# Patient Record
Sex: Male | Born: 1945 | Race: White | Hispanic: No | Marital: Married | Smoking: Former smoker
Health system: Southern US, Community
[De-identification: ages and names within clinical notes are randomized; demographics above are authoritative.]

## PROBLEM LIST (undated history)

## (undated) DIAGNOSIS — I252 Old myocardial infarction: Secondary | ICD-10-CM

## (undated) DIAGNOSIS — E119 Type 2 diabetes mellitus without complications: Secondary | ICD-10-CM

## (undated) DIAGNOSIS — I8290 Acute embolism and thrombosis of unspecified vein: Secondary | ICD-10-CM

## (undated) DIAGNOSIS — I2510 Atherosclerotic heart disease of native coronary artery without angina pectoris: Secondary | ICD-10-CM

## (undated) DIAGNOSIS — I10 Essential (primary) hypertension: Secondary | ICD-10-CM

## (undated) DIAGNOSIS — B029 Zoster without complications: Secondary | ICD-10-CM

## (undated) DIAGNOSIS — I6529 Occlusion and stenosis of unspecified carotid artery: Secondary | ICD-10-CM

## (undated) DIAGNOSIS — E1142 Type 2 diabetes mellitus with diabetic polyneuropathy: Secondary | ICD-10-CM

## (undated) DIAGNOSIS — K559 Vascular disorder of intestine, unspecified: Secondary | ICD-10-CM

## (undated) DIAGNOSIS — Z87898 Personal history of other specified conditions: Secondary | ICD-10-CM

## (undated) DIAGNOSIS — E785 Hyperlipidemia, unspecified: Secondary | ICD-10-CM

## (undated) DIAGNOSIS — I82409 Acute embolism and thrombosis of unspecified deep veins of unspecified lower extremity: Secondary | ICD-10-CM

## (undated) DIAGNOSIS — E039 Hypothyroidism, unspecified: Secondary | ICD-10-CM

## (undated) DIAGNOSIS — Z87891 Personal history of nicotine dependence: Secondary | ICD-10-CM

## (undated) DIAGNOSIS — N400 Enlarged prostate without lower urinary tract symptoms: Secondary | ICD-10-CM

## (undated) DIAGNOSIS — M109 Gout, unspecified: Secondary | ICD-10-CM

## (undated) DIAGNOSIS — I219 Acute myocardial infarction, unspecified: Secondary | ICD-10-CM

## (undated) DIAGNOSIS — I739 Peripheral vascular disease, unspecified: Secondary | ICD-10-CM

## (undated) HISTORY — PX: OTHER SURGICAL HISTORY: SHX169

## (undated) HISTORY — DX: Type 2 diabetes mellitus without complications: E11.9

## (undated) HISTORY — DX: Hypothyroidism, unspecified: E03.9

## (undated) HISTORY — PX: CATARACT EXTRACTION: SUR2

## (undated) HISTORY — DX: Acute embolism and thrombosis of unspecified deep veins of unspecified lower extremity: I82.409

## (undated) HISTORY — DX: Type 2 diabetes mellitus with diabetic polyneuropathy: E11.42

## (undated) HISTORY — DX: Personal history of other specified conditions: Z87.898

## (undated) HISTORY — DX: Peripheral vascular disease, unspecified: I73.9

## (undated) HISTORY — DX: Occlusion and stenosis of unspecified carotid artery: I65.29

## (undated) HISTORY — DX: Zoster without complications: B02.9

## (undated) HISTORY — DX: Essential (primary) hypertension: I10

## (undated) HISTORY — DX: Atherosclerotic heart disease of native coronary artery without angina pectoris: I25.10

## (undated) HISTORY — DX: Personal history of nicotine dependence: Z87.891

## (undated) HISTORY — DX: Vascular disorder of intestine, unspecified: K55.9

## (undated) HISTORY — DX: Acute myocardial infarction, unspecified: I21.9

## (undated) HISTORY — DX: Benign prostatic hyperplasia without lower urinary tract symptoms: N40.0

## (undated) HISTORY — DX: Gout, unspecified: M10.9

## (undated) HISTORY — DX: Acute embolism and thrombosis of unspecified vein: I82.90

## (undated) HISTORY — DX: Hyperlipidemia, unspecified: E78.5

## (undated) HISTORY — DX: Old myocardial infarction: I25.2

---

## 1998-12-10 ENCOUNTER — Encounter (HOSPITAL_COMMUNITY): Admission: RE | Admit: 1998-12-10 | Discharge: 1999-03-10 | Payer: Self-pay

## 2000-07-30 ENCOUNTER — Inpatient Hospital Stay (HOSPITAL_COMMUNITY): Admission: EM | Admit: 2000-07-30 | Discharge: 2000-08-08 | Payer: Self-pay | Admitting: Emergency Medicine

## 2000-07-31 ENCOUNTER — Encounter: Payer: Self-pay | Admitting: Cardiothoracic Surgery

## 2000-08-04 ENCOUNTER — Encounter: Payer: Self-pay | Admitting: Cardiothoracic Surgery

## 2000-08-04 HISTORY — PX: CORONARY ARTERY BYPASS GRAFT: SHX141

## 2000-08-04 HISTORY — PX: PR VEIN BYPASS GRAFT,AORTO-FEM-POP: 35551

## 2000-08-05 ENCOUNTER — Encounter: Payer: Self-pay | Admitting: Cardiothoracic Surgery

## 2000-08-06 ENCOUNTER — Encounter: Payer: Self-pay | Admitting: *Deleted

## 2000-08-07 ENCOUNTER — Encounter: Payer: Self-pay | Admitting: Cardiothoracic Surgery

## 2004-06-13 ENCOUNTER — Emergency Department (HOSPITAL_COMMUNITY): Admission: EM | Admit: 2004-06-13 | Discharge: 2004-06-13 | Payer: Self-pay | Admitting: Family Medicine

## 2004-07-16 ENCOUNTER — Emergency Department (HOSPITAL_COMMUNITY): Admission: EM | Admit: 2004-07-16 | Discharge: 2004-07-16 | Payer: Self-pay | Admitting: Family Medicine

## 2005-03-03 ENCOUNTER — Ambulatory Visit: Payer: Self-pay | Admitting: Internal Medicine

## 2005-03-17 ENCOUNTER — Ambulatory Visit: Payer: Self-pay

## 2005-03-17 ENCOUNTER — Ambulatory Visit: Payer: Self-pay | Admitting: Internal Medicine

## 2005-04-02 ENCOUNTER — Ambulatory Visit: Payer: Self-pay | Admitting: Internal Medicine

## 2005-05-07 ENCOUNTER — Ambulatory Visit: Payer: Self-pay | Admitting: Internal Medicine

## 2005-05-13 ENCOUNTER — Ambulatory Visit: Payer: Self-pay | Admitting: Internal Medicine

## 2005-05-14 ENCOUNTER — Ambulatory Visit: Payer: Self-pay | Admitting: Internal Medicine

## 2005-06-24 DIAGNOSIS — K559 Vascular disorder of intestine, unspecified: Secondary | ICD-10-CM

## 2005-06-24 HISTORY — DX: Vascular disorder of intestine, unspecified: K55.9

## 2005-07-21 ENCOUNTER — Ambulatory Visit: Payer: Self-pay | Admitting: Internal Medicine

## 2005-07-21 ENCOUNTER — Inpatient Hospital Stay (HOSPITAL_COMMUNITY): Admission: EM | Admit: 2005-07-21 | Discharge: 2005-07-25 | Payer: Self-pay | Admitting: Emergency Medicine

## 2005-07-22 ENCOUNTER — Ambulatory Visit: Payer: Self-pay | Admitting: Internal Medicine

## 2005-07-24 ENCOUNTER — Encounter (INDEPENDENT_AMBULATORY_CARE_PROVIDER_SITE_OTHER): Payer: Self-pay | Admitting: *Deleted

## 2005-07-24 ENCOUNTER — Ambulatory Visit: Payer: Self-pay | Admitting: Gastroenterology

## 2005-07-31 ENCOUNTER — Ambulatory Visit: Payer: Self-pay | Admitting: Internal Medicine

## 2005-08-07 ENCOUNTER — Ambulatory Visit: Payer: Self-pay | Admitting: Internal Medicine

## 2005-08-19 ENCOUNTER — Ambulatory Visit: Payer: Self-pay | Admitting: Internal Medicine

## 2005-09-05 ENCOUNTER — Ambulatory Visit: Payer: Self-pay | Admitting: Internal Medicine

## 2005-09-10 ENCOUNTER — Ambulatory Visit: Payer: Self-pay | Admitting: Internal Medicine

## 2005-09-17 ENCOUNTER — Ambulatory Visit: Payer: Self-pay | Admitting: Internal Medicine

## 2005-12-19 ENCOUNTER — Ambulatory Visit: Payer: Self-pay | Admitting: Internal Medicine

## 2005-12-23 ENCOUNTER — Ambulatory Visit: Payer: Self-pay | Admitting: Internal Medicine

## 2006-02-16 ENCOUNTER — Ambulatory Visit: Payer: Self-pay | Admitting: Internal Medicine

## 2006-02-16 LAB — CONVERTED CEMR LAB: PSA: 0.24 ng/mL

## 2006-02-19 ENCOUNTER — Ambulatory Visit: Payer: Self-pay | Admitting: Internal Medicine

## 2006-04-30 ENCOUNTER — Ambulatory Visit: Payer: Self-pay | Admitting: Internal Medicine

## 2006-05-26 ENCOUNTER — Ambulatory Visit: Payer: Self-pay | Admitting: Internal Medicine

## 2006-08-26 ENCOUNTER — Ambulatory Visit: Payer: Self-pay | Admitting: Internal Medicine

## 2006-12-02 ENCOUNTER — Ambulatory Visit: Payer: Self-pay | Admitting: Internal Medicine

## 2007-03-03 ENCOUNTER — Ambulatory Visit: Payer: Self-pay | Admitting: Internal Medicine

## 2007-03-03 LAB — CONVERTED CEMR LAB
ALT: 16 units/L (ref 0–40)
AST: 17 units/L (ref 0–37)
Albumin: 3.7 g/dL (ref 3.5–5.2)
Calcium: 9.3 mg/dL (ref 8.4–10.5)
Chloride: 110 meq/L (ref 96–112)
Cholesterol: 95 mg/dL (ref 0–200)
Creatinine, Ser: 1.4 mg/dL (ref 0.4–1.5)
Creatinine,U: 72.7 mg/dL
GFR calc non Af Amer: 55 mL/min
Glucose, Bld: 118 mg/dL — ABNORMAL HIGH (ref 70–99)
HDL: 38.6 mg/dL — ABNORMAL LOW (ref >39.0)
Hgb A1c MFr Bld: 6 % (ref 4.6–6.0)
LDL Cholesterol: 42 mg/dL (ref 0–99)
Sodium: 143 meq/L (ref 135–145)
Total Bilirubin: 0.7 mg/dL (ref 0.3–1.2)
VLDL: 14 mg/dL (ref 0–40)

## 2007-06-03 ENCOUNTER — Ambulatory Visit: Payer: Self-pay | Admitting: Internal Medicine

## 2007-06-17 ENCOUNTER — Encounter: Payer: Self-pay | Admitting: Internal Medicine

## 2007-06-17 DIAGNOSIS — K559 Vascular disorder of intestine, unspecified: Secondary | ICD-10-CM | POA: Insufficient documentation

## 2007-06-17 DIAGNOSIS — I10 Essential (primary) hypertension: Secondary | ICD-10-CM | POA: Insufficient documentation

## 2007-06-17 DIAGNOSIS — I2510 Atherosclerotic heart disease of native coronary artery without angina pectoris: Secondary | ICD-10-CM

## 2007-06-17 DIAGNOSIS — E118 Type 2 diabetes mellitus with unspecified complications: Secondary | ICD-10-CM

## 2007-06-17 DIAGNOSIS — F17200 Nicotine dependence, unspecified, uncomplicated: Secondary | ICD-10-CM | POA: Insufficient documentation

## 2007-06-17 DIAGNOSIS — I252 Old myocardial infarction: Secondary | ICD-10-CM | POA: Insufficient documentation

## 2007-06-17 DIAGNOSIS — E785 Hyperlipidemia, unspecified: Secondary | ICD-10-CM | POA: Insufficient documentation

## 2007-06-17 DIAGNOSIS — F339 Major depressive disorder, recurrent, unspecified: Secondary | ICD-10-CM

## 2007-06-17 DIAGNOSIS — N400 Enlarged prostate without lower urinary tract symptoms: Secondary | ICD-10-CM

## 2007-06-22 ENCOUNTER — Ambulatory Visit (HOSPITAL_BASED_OUTPATIENT_CLINIC_OR_DEPARTMENT_OTHER): Admission: RE | Admit: 2007-06-22 | Discharge: 2007-06-22 | Payer: Self-pay | Admitting: Internal Medicine

## 2007-07-07 ENCOUNTER — Ambulatory Visit: Payer: Self-pay | Admitting: Pulmonary Disease

## 2008-04-24 DIAGNOSIS — I8290 Acute embolism and thrombosis of unspecified vein: Secondary | ICD-10-CM

## 2008-04-24 HISTORY — DX: Acute embolism and thrombosis of unspecified vein: I82.90

## 2008-04-26 ENCOUNTER — Inpatient Hospital Stay (HOSPITAL_COMMUNITY): Admission: EM | Admit: 2008-04-26 | Discharge: 2008-04-28 | Payer: Self-pay | Admitting: Emergency Medicine

## 2008-04-26 ENCOUNTER — Encounter: Payer: Self-pay | Admitting: Vascular Surgery

## 2008-04-26 ENCOUNTER — Ambulatory Visit: Payer: Self-pay | Admitting: Cardiology

## 2008-04-26 ENCOUNTER — Encounter (INDEPENDENT_AMBULATORY_CARE_PROVIDER_SITE_OTHER): Payer: Self-pay | Admitting: Emergency Medicine

## 2008-04-26 ENCOUNTER — Ambulatory Visit: Payer: Self-pay | Admitting: Vascular Surgery

## 2008-04-27 ENCOUNTER — Encounter: Payer: Self-pay | Admitting: Vascular Surgery

## 2008-04-28 ENCOUNTER — Ambulatory Visit: Payer: Self-pay | Admitting: *Deleted

## 2008-05-16 ENCOUNTER — Ambulatory Visit: Payer: Self-pay | Admitting: Vascular Surgery

## 2008-05-25 ENCOUNTER — Ambulatory Visit: Payer: Self-pay | Admitting: Cardiology

## 2008-07-04 ENCOUNTER — Ambulatory Visit: Payer: Self-pay | Admitting: Vascular Surgery

## 2008-08-15 ENCOUNTER — Ambulatory Visit: Payer: Self-pay | Admitting: Internal Medicine

## 2008-08-15 DIAGNOSIS — I749 Embolism and thrombosis of unspecified artery: Secondary | ICD-10-CM | POA: Insufficient documentation

## 2008-08-22 ENCOUNTER — Ambulatory Visit: Payer: Self-pay | Admitting: Hematology & Oncology

## 2008-09-01 ENCOUNTER — Encounter: Payer: Self-pay | Admitting: Internal Medicine

## 2008-09-01 LAB — CBC WITH DIFFERENTIAL (CANCER CENTER ONLY)
BASO#: 0.1 10*3/uL (ref 0.0–0.2)
EOS%: 4.2 % (ref 0.0–7.0)
Eosinophils Absolute: 0.4 10*3/uL (ref 0.0–0.5)
HGB: 15.5 g/dL (ref 13.0–17.1)
LYMPH#: 2.3 10*3/uL (ref 0.9–3.3)
MONO#: 0.6 10*3/uL (ref 0.1–0.9)
NEUT#: 5.5 10*3/uL (ref 1.5–6.5)
RBC: 5.31 10*6/uL (ref 4.20–5.70)
WBC: 8.9 10*3/uL (ref 4.0–10.0)

## 2008-09-08 LAB — HYPERCOAGULABLE PANEL, COMPREHENSIVE RET.
Anticardiolipin IgA: 11 [APL'U] (ref <13)
DRVVT: 52.5 secs — ABNORMAL HIGH (ref 36.1–47.0)
Homocysteine: 13.9 umol/L (ref 4.0–15.4)
Protein C Activity: 128 % (ref 75–133)
Protein C, Total: 88 % (ref 70–140)
Protein S Activity: 131 % — ABNORMAL HIGH (ref 69–129)

## 2008-09-21 ENCOUNTER — Ambulatory Visit: Payer: Self-pay | Admitting: Internal Medicine

## 2008-09-21 LAB — CONVERTED CEMR LAB
ALT: 17 units/L (ref 0–53)
Albumin: 4.1 g/dL (ref 3.5–5.2)
BUN: 33 mg/dL — ABNORMAL HIGH (ref 6–23)
Bilirubin, Direct: 0.1 mg/dL (ref 0.0–0.3)
CO2: 24 meq/L (ref 19–32)
Calcium: 9.3 mg/dL (ref 8.4–10.5)
Cholesterol: 108 mg/dL (ref 0–200)
Creatinine, Ser: 1.7 mg/dL — ABNORMAL HIGH (ref 0.4–1.5)
Creatinine,U: 174.3 mg/dL
GFR calc Af Amer: 53 mL/min
Glucose, Bld: 111 mg/dL — ABNORMAL HIGH (ref 70–99)
HDL: 29.2 mg/dL — ABNORMAL LOW (ref >39.0)
Microalb Creat Ratio: 34.4 mg/g — ABNORMAL HIGH (ref 0.0–30.0)
Microalb, Ur: 6 mg/dL — ABNORMAL HIGH (ref 0.0–1.9)
Total Protein: 7.5 g/dL (ref 6.0–8.3)
Triglycerides: 100 mg/dL (ref 0–149)
VLDL: 20 mg/dL (ref 0–40)

## 2008-09-26 ENCOUNTER — Ambulatory Visit: Payer: Self-pay | Admitting: Internal Medicine

## 2008-10-04 ENCOUNTER — Encounter: Payer: Self-pay | Admitting: Internal Medicine

## 2009-01-01 ENCOUNTER — Ambulatory Visit: Payer: Self-pay | Admitting: Internal Medicine

## 2009-01-01 LAB — CONVERTED CEMR LAB
BUN: 22 mg/dL (ref 6–23)
CO2: 27 meq/L (ref 19–32)
Calcium: 9.8 mg/dL (ref 8.4–10.5)
Glucose, Bld: 104 mg/dL — ABNORMAL HIGH (ref 70–99)
Microalb, Ur: 8.4 mg/dL — ABNORMAL HIGH (ref 0.0–1.9)
Sodium: 139 meq/L (ref 135–145)

## 2009-04-02 ENCOUNTER — Ambulatory Visit: Payer: Self-pay | Admitting: Internal Medicine

## 2009-04-02 DIAGNOSIS — E1140 Type 2 diabetes mellitus with diabetic neuropathy, unspecified: Secondary | ICD-10-CM

## 2009-05-07 ENCOUNTER — Encounter: Payer: Self-pay | Admitting: Internal Medicine

## 2009-05-08 ENCOUNTER — Encounter: Payer: Self-pay | Admitting: Internal Medicine

## 2009-07-27 ENCOUNTER — Ambulatory Visit: Payer: Self-pay | Admitting: Internal Medicine

## 2009-07-27 LAB — CONVERTED CEMR LAB
ALT: 20 units/L (ref 0–53)
AST: 18 units/L (ref 0–37)
Albumin: 4.2 g/dL (ref 3.5–5.2)
Alkaline Phosphatase: 62 units/L (ref 39–117)
Chloride: 108 meq/L (ref 96–112)
Creatinine,U: 61.4 mg/dL
GFR calc non Af Amer: 54.22 mL/min (ref >60)
Glucose, Bld: 90 mg/dL (ref 70–99)
Hgb A1c MFr Bld: 6 % (ref 4.6–6.5)
LDL Cholesterol: 60 mg/dL (ref 0–99)
Microalb, Ur: 6.7 mg/dL — ABNORMAL HIGH (ref 0.0–1.9)
Potassium: 4.2 meq/L (ref 3.5–5.1)
Sodium: 142 meq/L (ref 135–145)
Total Bilirubin: 0.7 mg/dL (ref 0.3–1.2)
VLDL: 26.8 mg/dL (ref 0.0–40.0)

## 2009-08-03 ENCOUNTER — Ambulatory Visit: Payer: Self-pay | Admitting: Internal Medicine

## 2009-09-28 ENCOUNTER — Encounter: Payer: Self-pay | Admitting: Vascular Surgery

## 2009-09-28 ENCOUNTER — Inpatient Hospital Stay (HOSPITAL_COMMUNITY): Admission: EM | Admit: 2009-09-28 | Discharge: 2009-09-29 | Payer: Self-pay | Admitting: Emergency Medicine

## 2009-09-28 ENCOUNTER — Ambulatory Visit: Payer: Self-pay | Admitting: Vascular Surgery

## 2009-09-29 ENCOUNTER — Encounter (INDEPENDENT_AMBULATORY_CARE_PROVIDER_SITE_OTHER): Payer: Self-pay | Admitting: Family Medicine

## 2009-10-02 ENCOUNTER — Ambulatory Visit: Payer: Self-pay | Admitting: Internal Medicine

## 2009-10-02 DIAGNOSIS — R55 Syncope and collapse: Secondary | ICD-10-CM | POA: Insufficient documentation

## 2009-10-10 ENCOUNTER — Encounter: Payer: Self-pay | Admitting: Cardiology

## 2009-10-10 ENCOUNTER — Encounter (INDEPENDENT_AMBULATORY_CARE_PROVIDER_SITE_OTHER): Payer: Self-pay | Admitting: *Deleted

## 2009-10-10 ENCOUNTER — Ambulatory Visit: Payer: Self-pay | Admitting: Cardiology

## 2009-10-10 ENCOUNTER — Telehealth: Payer: Self-pay | Admitting: Internal Medicine

## 2009-10-25 ENCOUNTER — Telehealth (INDEPENDENT_AMBULATORY_CARE_PROVIDER_SITE_OTHER): Payer: Self-pay | Admitting: *Deleted

## 2009-10-29 ENCOUNTER — Ambulatory Visit (HOSPITAL_COMMUNITY): Admission: RE | Admit: 2009-10-29 | Discharge: 2009-10-29 | Payer: Self-pay | Admitting: Cardiology

## 2009-10-29 ENCOUNTER — Ambulatory Visit: Payer: Self-pay

## 2009-10-29 ENCOUNTER — Ambulatory Visit: Payer: Self-pay | Admitting: Cardiology

## 2009-10-29 ENCOUNTER — Encounter: Payer: Self-pay | Admitting: Cardiology

## 2009-10-29 ENCOUNTER — Encounter (HOSPITAL_COMMUNITY): Admission: RE | Admit: 2009-10-29 | Discharge: 2009-11-21 | Payer: Self-pay | Admitting: Cardiology

## 2009-11-28 ENCOUNTER — Telehealth (INDEPENDENT_AMBULATORY_CARE_PROVIDER_SITE_OTHER): Payer: Self-pay | Admitting: *Deleted

## 2009-12-12 ENCOUNTER — Encounter: Payer: Self-pay | Admitting: Cardiology

## 2009-12-12 ENCOUNTER — Ambulatory Visit: Payer: Self-pay | Admitting: Cardiology

## 2010-01-24 ENCOUNTER — Ambulatory Visit: Payer: Self-pay | Admitting: Internal Medicine

## 2010-01-24 LAB — CONVERTED CEMR LAB
Albumin: 4.3 g/dL (ref 3.5–5.2)
BUN: 18 mg/dL (ref 6–23)
Calcium: 9.5 mg/dL (ref 8.4–10.5)
Cholesterol: 112 mg/dL (ref 0–200)
GFR calc non Af Amer: 54.14 mL/min (ref >60)
Glucose, Bld: 124 mg/dL — ABNORMAL HIGH (ref 70–99)
HDL: 37.8 mg/dL — ABNORMAL LOW (ref >39.00)
Hgb A1c MFr Bld: 6.1 % (ref 4.6–6.5)
Microalb Creat Ratio: 228.6 mg/g — ABNORMAL HIGH (ref 0.0–30.0)
Triglycerides: 144 mg/dL (ref 0.0–149.0)
VLDL: 28.8 mg/dL (ref 0.0–40.0)

## 2010-01-31 ENCOUNTER — Ambulatory Visit: Payer: Self-pay | Admitting: Internal Medicine

## 2010-04-01 ENCOUNTER — Ambulatory Visit: Payer: Self-pay | Admitting: Internal Medicine

## 2010-04-01 LAB — CONVERTED CEMR LAB: TSH: 3.79 microintl units/mL (ref 0.35–5.50)

## 2010-04-03 ENCOUNTER — Telehealth: Payer: Self-pay | Admitting: Internal Medicine

## 2010-06-04 ENCOUNTER — Ambulatory Visit: Payer: Self-pay | Admitting: Internal Medicine

## 2010-06-04 ENCOUNTER — Telehealth: Payer: Self-pay | Admitting: Internal Medicine

## 2010-06-04 LAB — CONVERTED CEMR LAB: TSH: 2.2 microintl units/mL (ref 0.35–5.50)

## 2010-08-01 ENCOUNTER — Encounter: Payer: Self-pay | Admitting: Internal Medicine

## 2010-08-01 ENCOUNTER — Telehealth: Payer: Self-pay | Admitting: Internal Medicine

## 2010-08-02 ENCOUNTER — Ambulatory Visit: Payer: Self-pay | Admitting: Internal Medicine

## 2010-08-02 LAB — CONVERTED CEMR LAB
BUN: 38 mg/dL — ABNORMAL HIGH (ref 6–23)
Hgb A1c MFr Bld: 6.1 % — ABNORMAL HIGH (ref <5.7)
Potassium: 4.2 meq/L (ref 3.5–5.3)
Sodium: 141 meq/L (ref 135–145)

## 2010-08-05 ENCOUNTER — Telehealth: Payer: Self-pay | Admitting: Internal Medicine

## 2010-08-05 ENCOUNTER — Encounter: Payer: Self-pay | Admitting: Internal Medicine

## 2010-08-15 ENCOUNTER — Telehealth: Payer: Self-pay | Admitting: Internal Medicine

## 2010-08-21 ENCOUNTER — Telehealth: Payer: Self-pay | Admitting: Internal Medicine

## 2010-08-21 ENCOUNTER — Ambulatory Visit: Payer: Self-pay | Admitting: Internal Medicine

## 2010-08-21 LAB — CONVERTED CEMR LAB
CO2: 28 meq/L (ref 19–32)
Calcium: 9.6 mg/dL (ref 8.4–10.5)
Sodium: 140 meq/L (ref 135–145)

## 2010-09-18 ENCOUNTER — Ambulatory Visit: Payer: Self-pay | Admitting: Internal Medicine

## 2010-09-18 ENCOUNTER — Telehealth: Payer: Self-pay | Admitting: Internal Medicine

## 2010-09-18 LAB — CONVERTED CEMR LAB
BUN: 28 mg/dL — ABNORMAL HIGH (ref 6–23)
Chloride: 106 meq/L (ref 96–112)
Potassium: 4.5 meq/L (ref 3.5–5.1)

## 2010-11-04 ENCOUNTER — Ambulatory Visit: Payer: Self-pay | Admitting: Internal Medicine

## 2010-11-04 ENCOUNTER — Encounter: Payer: Self-pay | Admitting: Internal Medicine

## 2010-11-04 DIAGNOSIS — N189 Chronic kidney disease, unspecified: Secondary | ICD-10-CM

## 2010-11-04 LAB — CONVERTED CEMR LAB
CO2: 25 meq/L (ref 19–32)
Calcium: 9.7 mg/dL (ref 8.4–10.5)
Glucose, Bld: 87 mg/dL (ref 70–99)
Potassium: 4.4 meq/L (ref 3.5–5.3)
Sodium: 140 meq/L (ref 135–145)

## 2010-11-05 ENCOUNTER — Encounter: Payer: Self-pay | Admitting: Internal Medicine

## 2010-11-24 HISTORY — PX: OTHER SURGICAL HISTORY: SHX169

## 2010-11-25 IMAGING — CT CT HEAD W/O CM
1 of 9 series · 3 of 33 positions shown, 4 images · IV contrast (agent unspecified)
Comparison: None.
COMPARISON: None.

CLINICAL DATA: Pain in back of neck followed by syncopal episode.
History diabetes.

CT HEAD WITHOUT CONTRAST
TECHNIQUE: Contiguous axial images were obtained from the base of
the skull through the vertex without intravenous contrast.
TECHNIQUE: Multidetector CT imaging of the neck was performed
using the standard protocol during bolus administration of
intravenous contrast. Multiplanar CT image reconstructions
including MIPs were obtained to evaluate the vascular anatomy.
Carotid stenosis measurements (when applicable) are obtained
utilizing NASCET criteria, using the distal internal carotid
diameter as the denominator.
Contrast: 100 ml Hmnipaque-7PP (approval by Dr. Peder Gervin with
recommendation for hydration before after procedure).

[Series 8: angio neck 2mm · axial · 0.43mm/px · z∈[+1028,+1246]mm · 3 of 110 slices shown, 4 images]
[im 1/110  soft-tissue]
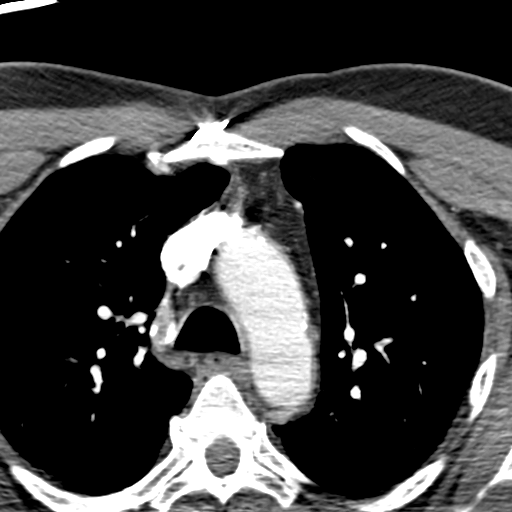
[im 1/110  bone]
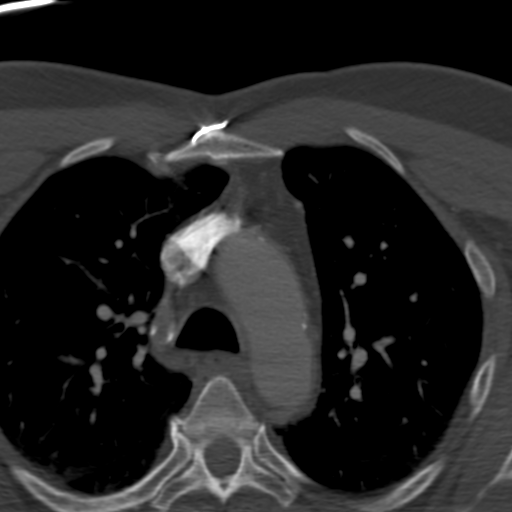
[im 55/110  bone]
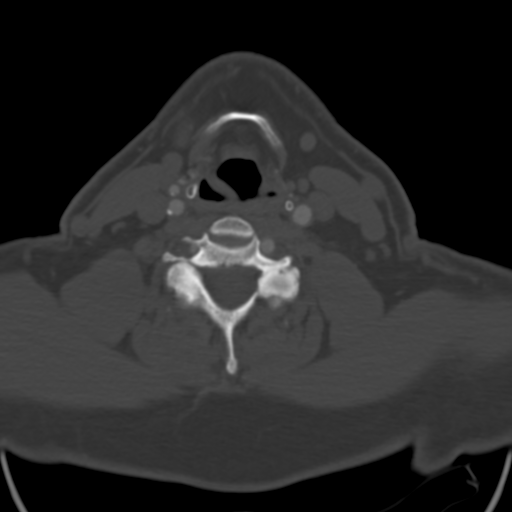
[im 110/110  bone]
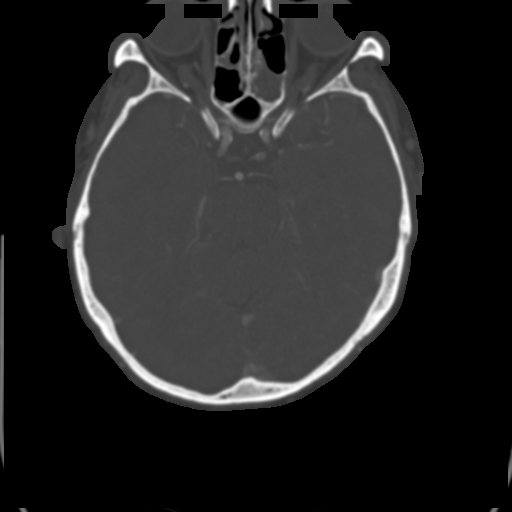

[3 of 33 positions shown; findings below may reference images not displayed]

FINDINGS: No intracranial hemorrhage.  No hydrocephalus.

Subtle hypodensity right paracentral pontine region.  Small infarct
at the level cannot be excluded although streak artifact limits
evaluation.  Left periatrial hypodensity may represent result of
prior infarct.  If further delineation is clinically desired, MR
imaging may be considered.  No intracranial mass lesion detected on
this unenhanced exam.  Vascular calcifications.

Opacification paranasal sinuses most notable involving the right
frontal sinus and left maxillary sinus.  No evidence of skull
fracture.
IMPRESSION: No intracranial hemorrhage or skull fracture.

Subtle hypodensity right paracentral pontine region.  Small acute
infarct cannot be excluded.

Hypodensity left periatrial region may be related to remote
infarct/small vessel disease type changes.

Paranasal sinus opacification as discussed above.
FINDINGS: Normal configuration of the origin of the great vessels
from the aortic arch.  Atherosclerotic type changes of the aortic
arch and proximal great vessels with mild narrowing proximal great
vessels. Plaque with mild narrowing of the left subclavian artery
just before the takeoff of the dominant left vertebral artery

Left vertebral artery is dominant.  Minimal narrowing proximal
aspect with mild kink.  No significant narrowing or dissection of
the cervical segment of the left vertebral artery.  Intracranial
aspect of the left vertebral artery with focal plaque and mild to
moderate narrowing.

Mild narrowing proximal nondominant right vertebral artery.  No
evidence of dissection of the cervical segment of the right
vertebral artery.  Plaque formation distal cervical segment right
vertebral artery with mild to moderate narrowing.  The right
vertebral artery ends in a PICA distribution predominantly with
only small vessel traversing towards the basilar artery.

Mild calcified noncalcified plaque along the right common carotid
artery without significant narrowing.  Moderate plaque right
carotid bifurcation. Proximal right internal carotid artery
narrowed to 1.4 mm.  Distally the vessel measures 5.6 mm and
therefore a 75% diameter stenosis.  Node dissection of the right
internal carotid artery.  Moderate narrowing cavernous segment
right internal carotid artery.

Prominent plaque left carotid bifurcation.  Proximal left internal
carotid artery narrowed to 1.4 mm.  Distally the vessel measures
5.8 mm and therefore a 76% diameter stenosis.  No evidence of
dissection of the left internal carotid artery.  Moderate narrowing
cavernous segment left internal carotid artery.

Elongated right lobe of the thyroid gland projects posteriorly and
superiorly.  No dominant thyroid mass.  Evaluation limited by
streak artifact.

Cervical spondylotic changes and spinal stenosis most notable C5-6
and C6-7.  No obvious fracture.  Evaluation slightly limited by
artifact.  Minimal anterior slip of the C4 felt to be related to
facet joint degenerative changes.

 Review of the MIP images confirms the above findings.
IMPRESSION: No evidence of dissection of the vertebral arteries or carotid
arteries.  Left vertebral artery is dominant with the right
vertebral artery ending predominately in a PICA distribution.

Atherosclerotic type changes at the carotid bifurcation with 75%
diameter stenosis proximal right internal carotid artery and 76%
diameter stenosis proximal left internal carotid artery.

Cervical spondylotic changes with spinal stenosis most notable C5-6
and C6-7.

Elongated superiorly extending right lobe the thyroid gland.

Critical test results telephoned to Dr. Apel at the time of

## 2010-11-28 ENCOUNTER — Ambulatory Visit: Admission: RE | Admit: 2010-11-28 | Discharge: 2010-11-28 | Payer: Self-pay | Source: Home / Self Care

## 2010-11-28 ENCOUNTER — Ambulatory Visit: Admit: 2010-11-28 | Payer: Self-pay | Admitting: Vascular Surgery

## 2010-12-02 ENCOUNTER — Ambulatory Visit: Admit: 2010-12-02 | Payer: Self-pay | Admitting: Vascular Surgery

## 2010-12-02 ENCOUNTER — Ambulatory Visit
Admission: RE | Admit: 2010-12-02 | Discharge: 2010-12-02 | Payer: Self-pay | Source: Home / Self Care | Attending: Vascular Surgery | Admitting: Vascular Surgery

## 2010-12-09 ENCOUNTER — Ambulatory Visit: Admit: 2010-12-09 | Payer: Self-pay | Admitting: Internal Medicine

## 2010-12-17 ENCOUNTER — Ambulatory Visit (HOSPITAL_COMMUNITY)
Admission: RE | Admit: 2010-12-17 | Discharge: 2010-12-17 | Payer: Self-pay | Source: Home / Self Care | Attending: Surgery | Admitting: Surgery

## 2010-12-18 ENCOUNTER — Ambulatory Visit: Admit: 2010-12-18 | Payer: Self-pay

## 2010-12-18 LAB — POCT I-STAT, CHEM 8
BUN: 28 mg/dL — ABNORMAL HIGH (ref 6–23)
Calcium, Ion: 1.13 mmol/L (ref 1.12–1.32)
Chloride: 106 mEq/L (ref 96–112)
Glucose, Bld: 111 mg/dL — ABNORMAL HIGH (ref 70–99)
Potassium: 4.2 mEq/L (ref 3.5–5.1)

## 2010-12-19 LAB — POCT ACTIVATED CLOTTING TIME
Activated Clotting Time: 252 seconds
Activated Clotting Time: 175 seconds
Activated Clotting Time: 169 seconds

## 2010-12-26 NOTE — Progress Notes (Signed)
Summary: Lab results  Phone Note Outgoing Call   Summary of Call: call pt - thyroid blood test is normal.  continue same dose of thyroid medication.  repeat TSH in 6 months Initial call taken by: D. Thomos Lemons DO,  June 04, 2010 6:38 PM  Follow-up for Phone Call        attempted to contact patient at [phone], male answered stated she was not home where he could get the call. She was asked to have patient return the call to our office.  Follow-up by: Glendell Docker CMA,  June 05, 2010 10:03 AM  Additional Follow-up for Phone Call Additional follow up Details #1::        patient returned call, he was advised per Dr Artist Pais instructions. He states he will have his blood work drawn at the [facility] location. Lab appointment entered for January of 2012 Additional Follow-up by: Glendell Docker CMA,  June 05, 2010 1:18 PM

## 2010-12-26 NOTE — Progress Notes (Signed)
Summary: Lab Results  Phone Note Outgoing Call   Summary of Call: call pt - blood test shows kidney function worse.  I advise pt only take 1/2 of losartan.  see new rx.  pt needs to come back for BMET in 2 wks.  make sure pt not taking any NSAIDs Initial call taken by: D. Thomos Lemons DO,  August 05, 2010 8:06 AM  Follow-up for Phone Call        attempted to contact patient at [phone], voice recording reached stating the number is no longer in service or has been disconnected.  A letter has been mailed informing patient per Dr Artist Pais instructions. Lab has been entered for [facility] location. Follow-up by: Glendell Docker CMA,  August 05, 2010 8:12 AM    New/Updated Medications: LOSARTAN POTASSIUM 50 MG TABS (LOSARTAN POTASSIUM) one by mouth once daily Prescriptions: LOSARTAN POTASSIUM 50 MG TABS (LOSARTAN POTASSIUM) one by mouth once daily  #90 x 1   Entered and Authorized by:   D. Thomos Lemons DO   Signed by:   D. Thomos Lemons DO on 08/05/2010   Method used:   Faxed to ...       [facility]* (mail-order)       [address]       [place], [place]  [address]       Ph: [phone]       Fax: [phone]   RxID:   [id number]

## 2010-12-26 NOTE — Progress Notes (Signed)
Summary: Medication Change  Phone Note Outgoing Call   Summary of Call: call pt - drug bulletin warns against potential interaction with simvastatin and amlodipine.  I suggest pt reduce simvastatin to 20 mg. see new rx.  pt can finish current rx by taking 1/2 of 40 mg Initial call taken by: D. Thomos Lemons DO,  August 01, 2010 1:10 PM  Follow-up for Phone Call        attempted to contact patient at [phone], voice recording reached stating the number has been changed or is no longer in service.  A letter has been mailed to patient informing him per Dr Artist Pais instructions Follow-up by: Glendell Docker CMA,  August 01, 2010 1:41 PM    New/Updated Medications: SIMVASTATIN 20 MG TABS (SIMVASTATIN) one by mouth qpm Prescriptions: SIMVASTATIN 20 MG TABS (SIMVASTATIN) one by mouth qpm  #90 x 3   Entered and Authorized by:   D. Thomos Lemons DO   Signed by:   D. Thomos Lemons DO on 08/01/2010   Method used:   Electronically to        [facility]* (mail-order)       [address]       [place], [place]  [address]       Ph: [phone]       Fax: [phone]   RxID:   [id number]

## 2010-12-26 NOTE — Medication Information (Signed)
Summary: Simvastatin Dosage Clarification/[facility]  Simvastatin Dosage Clarification/[facility]   Imported By: Lanelle Bal 08/08/2010 12:17:34  _____________________________________________________________________  External Attachment:    Type:   Image     Comment:   External Document

## 2010-12-26 NOTE — Progress Notes (Signed)
  Phone Note Outgoing Call   Call placed by: Deliah Goody, RN,  November 28, 2009 11:15 AM Summary of Call: per dr Jens Som review, pt made aware that moniter shows sinus with rare PAC/PVC Deliah Goody, RN  November 28, 2009 11:17 AM

## 2010-12-26 NOTE — Assessment & Plan Note (Signed)
Summary: 6 MONTH FOLLOW UP/MHF   Vital Signs:  Patient profile:   65 year old male Weight:      212.50 pounds BMI:     30.60 O2 Sat:      97 % Temp:     98.0 degrees F oral Pulse rate:   77 / minute Pulse rhythm:   regular Resp:     18 per minute BP sitting:   120 / 70  (right arm) Cuff size:   large  Vitals Entered By: Glendell Docker CMA (January 31, 2010 2:10 PM) CC: Rm # 6 Month Follow up disease management, Type 2 diabetes mellitus follow-up Is Patient Diabetic? Yes Comments low blood sugar 104 high 135 avg 100-114   Primary Care Provider:  Dondra Spry DO  CC:  Rm # 6 Month Follow up disease management and Type 2 diabetes mellitus follow-up.  History of Present Illness:  Type 2 Diabetes Mellitus Follow-Up      This is a 65 year old man who presents for Type 2 diabetes mellitus follow-up.  The patient denies self managed hypoglycemia, hypoglycemia requiring help, and weight gain.  The patient denies the following symptoms: chest pain.  Since the last visit the patient reports good dietary compliance and monitoring blood glucose.    Hypertension Follow-Up      The patient also presents for Hypertension follow-up.  The patient denies lightheadedness, headaches, and edema.  The patient denies the following associated symptoms: chest pain.  Compliance with medications (by patient report) has been near 100%.    labs reviewed.  TSH elevated  Preventive Screening-Counseling & Management  Alcohol-Tobacco     Smoking Status: current  Allergies (verified): No Known Drug Allergies  Past History:  Past Medical History: Current Problems:  MYOCARDIAL INFARCTION, HX OF (ICD-412) HYPERTENSION (ICD-401.9) HYPERLIPIDEMIA (ICD-272.4)  CORONARY ARTERY DISEASE (ICD-414.00) DIABETIC PERIPHERAL NEUROPATHY (ICD-250.60) EMBOLISM AND THROMBOSIS, ARTERY (ICD-444.9) BENIGN PROSTATIC HYPERTROPHY (ICD-600.00) DIABETES MELLITUS, TYPE II (ICD-250.00) DEPRESSION (ICD-311) SMOKER  (ICD-305.1) Syncopal episode most likely vasovagal.  Acute thrombus of his right superficial femoral artery with claudication (04/2008)  Diabetic polyneuropathy  Hx of ischemic colitis - 06/2005  Past Surgical History: Lower Arterial Duplex thromboembolectomyof his right superficial femoral artery 04/2008   Coronary artery bypass grafting times four (left internal mammary artery to the left anterior descending, saphenous vein graft to diagonal,saphenous vein graft to obtuse marginal, saphenous vein graft to posterior descending).SURGEON:  Kathlee Nations Trigt III, M.D.ASSISTANT:  Areta Haber, [place].A.ANESTHESIA:  General Proc. Date: 09/11/01Adm. Date:  [id number]   Family History: Alcoholism Stroke CAD Hyperlipidemia Denies family hx of blood clots or blood disorder     Social History: Occupation:  Works at [employer] - lives with wife (2nd marriage) 3 children   Alcohol use-no Tobacco Use - Yes.     Review of Systems  The patient denies weight gain, chest pain, and prolonged cough.    Physical Exam  General:  alert, well-developed, and well-nourished.   Neck:  supple, no masses, and no carotid bruits.   Lungs:  normal respiratory effort and normal breath sounds.   Heart:  normal rate, regular rhythm, no murmur, and no gallop.   Extremities:  trace left pedal edema and trace right pedal edema.   Neurologic:  cranial nerves II-XII intact and gait normal.   Psych:  normally interactive, good eye contact, not anxious appearing, and not depressed appearing.     Impression & Recommendations:  Problem # 1:  HYPERTENSION (ICD-401.9) Assessment Unchanged  No further episodes of syncope or presyncope.  Maintain current medication regimen.  His updated medication list for this problem includes:    Carvedilol 12.5 Mg Tabs (Carvedilol) ..... One by mouth two times a day    Diovan 160 Mg Tabs (Valsartan) ..... One by mouth once daily    Amlodipine Besylate 10 Mg Tabs (Amlodipine  besylate) ..... One by mouth once daily  BP today: 120/70 Prior BP: 140/62 (12/12/2009)  Labs Reviewed: K+: 4.1 (01/24/2010) Creat: : 1.4 (01/24/2010)   Chol: 112 (01/24/2010)   HDL: 37.80 (01/24/2010)   LDL: 45 (01/24/2010)   TG: 144.0 (01/24/2010)  Problem # 2:  DIABETES MELLITUS, TYPE II (ICD-250.00) well controlled with diet.    His updated medication list for this problem includes:    Diovan 160 Mg Tabs (Valsartan) ..... One by mouth once daily    Aspirin Ec 325 Mg Tbec (Aspirin) .Marland Kitchen... Take one tablet by mouth daily  Labs Reviewed: Creat: 1.4 (01/24/2010)     Last Eye Exam: normal (05/07/2009) Reviewed HgBA1c results: 6.1 (01/24/2010)  6.0 (07/27/2009)  Problem # 3:  HYPOTHYROIDISM (ICD-244.9) start low dose thyroid replacement.  goal 1-2. His updated medication list for this problem includes:    Levothyroxine Sodium 25 Mcg Tabs (Levothyroxine sodium) ..... One by mouth at bedtime  Labs Reviewed: TSH: 5.49 (01/24/2010)    HgBA1c: 6.1 (01/24/2010) Chol: 112 (01/24/2010)   HDL: 37.80 (01/24/2010)   LDL: 45 (01/24/2010)   TG: 144.0 (01/24/2010)  Problem # 5:  SMOKER (ICD-305.1)  Encouraged smoking cessation and discussed different methods for smoking cessation.   counseled pt to quit smoking 3-5 mins  Complete Medication List: 1)  Carvedilol 12.5 Mg Tabs (Carvedilol) .... One by mouth two times a day 2)  Diovan 160 Mg Tabs (Valsartan) .... One by mouth once daily 3)  Amlodipine Besylate 10 Mg Tabs (Amlodipine besylate) .... One by mouth once daily 4)  Simvastatin 40 Mg Tabs (Simvastatin) .... One by mouth qpm 5)  Colchicine 0.6 Mg Tabs (Colchicine) .... Take 1 tablet by mouth once a day as needed 6)  Aspirin Ec 325 Mg Tbec (Aspirin) .... Take one tablet by mouth daily 7)  Levothyroxine Sodium 25 Mcg Tabs (Levothyroxine sodium) .... One by mouth at bedtime   Patient Instructions: 1)  Please schedule a follow-up appointment in 6 months. 2)  TSH:  244.90 3)  Please  return for lab work within 2 months Prescriptions: LEVOTHYROXINE SODIUM 25 MCG TABS (LEVOTHYROXINE SODIUM) one by mouth at bedtime  #30 x 2   Entered and Authorized by:   D. Thomos Lemons DO   Signed by:   D. Thomos Lemons DO on 01/31/2010   Method used:   Electronically to        [id number] (retail)       [address]       [place], [place]  [address]       Ph: [phone]       Fax: [phone]   RxID:   [id number]   Current Allergies (reviewed today): No known allergies

## 2010-12-26 NOTE — Letter (Signed)
Summary: Generic Letter  [facility]  [address]   [place], [place] [address]   Phone: [phone]  Fax: [phone]      08/05/2010    Fernando Combs [address] [place], [place]  [address]      Dear [name],  I have attempted to contact you at [phone], an have been unsuccessful. I have reached a recording stating the number has been disconnected. Dr Artist Pais asked that you be informed your recent blood work shows kidney function is worse. He advises to take 1/2 of Losartan and return for non fasting blood work in 2 weeks. He advises agains taking any non-steroidal anti inflammatory medication such as ibuprofen of aleve.   Please call our office if any questions and to update contact information.          Sincerely,   Glendell Docker CMA Dr Thomos Lemons

## 2010-12-26 NOTE — Assessment & Plan Note (Signed)
Summary: 6 month follow up/mhf   Vital Signs:  Patient profile:   65 year old male Height:      70 inches Weight:      207.50 pounds BMI:     29.88 O2 Sat:      97 % on Room air Temp:     98 degrees F oral Pulse rate:   73 / minute Pulse rhythm:   regular Resp:     16 per minute BP sitting:   134 / 70  (left arm)  Vitals Entered By: Glendell Docker CMA (August 02, 2010 2:18 PM)  O2 Flow:  Room air CC: 6 month follow up , Type 2 diabetes mellitus follow-up Is Patient Diabetic? Yes Did you bring your meter with you today? No Pain Assessment Patient in pain? no      Comments blood sugar average 115- 120, flu shot with employer,  [facility], would like a generic alternative to Diovan copay is 125 for a 3 month supply   Primary Care Provider:  D. Thomos Lemons DO  CC:  6 month follow up  and Type 2 diabetes mellitus follow-up.  History of Present Illness:  Type 2 Diabetes Mellitus Follow-Up      This is a 65 year old man who presents for Type 2 diabetes mellitus follow-up.  The patient denies self managed hypoglycemia, hypoglycemia requiring help, and weight gain.  The patient denies the following symptoms: chest pain.  Since the last visit the patient reports good dietary compliance, compliance with medications, and monitoring blood glucose.    He quit smoking by using nicotine patch  Preventive Screening-Counseling & Management  Alcohol-Tobacco     Smoking Status: quit < 6 months  Allergies (verified): No Known Drug Allergies  Past History:  Past Medical History: Current Problems:  MYOCARDIAL INFARCTION, HX OF (ICD-412) HYPERTENSION (ICD-401.9) HYPERLIPIDEMIA (ICD-272.4)   CORONARY ARTERY DISEASE (ICD-414.00) DIABETIC PERIPHERAL NEUROPATHY (ICD-250.60) EMBOLISM AND THROMBOSIS, ARTERY (ICD-444.9) BENIGN PROSTATIC HYPERTROPHY (ICD-600.00) DIABETES MELLITUS, TYPE II (ICD-250.00) DEPRESSION (ICD-311) SMOKER (ICD-305.1) Syncopal episode most likely  vasovagal.  Acute thrombus of his right superficial femoral artery with claudication (04/2008)  Diabetic polyneuropathy  Hx of ischemic colitis - 06/2005  Past Surgical History: Lower Arterial Duplex  thromboembolectomyof his right superficial femoral artery 04/2008   Coronary artery bypass grafting times four (left internal mammary artery to the left anterior descending, saphenous vein graft to diagonal,saphenous vein graft to obtuse marginal, saphenous vein graft to posterior descending).SURGEON:  Kathlee Nations Trigt III, M.D.ASSISTANT:  Areta Haber, [place].A.ANESTHESIA:  General Proc. Date: 09/11/01Adm. Date:  [id number]   Family History: Alcoholism Stroke CAD Hyperlipidemia Denies family hx of blood clots or blood disorder      Social History: Occupation:  Works at [employer] - lives with wife (2nd marriage) 3 children   Alcohol use-no Tobacco Use - Yes.    Smoking Status:  quit < 6 months  Physical Exam  General:  alert, well-developed, and well-nourished.   Lungs:  normal respiratory effort and normal breath sounds.   Heart:  normal rate, regular rhythm, no murmur, and no gallop.   Extremities:  trace left pedal edema and trace right pedal edema.   Neurologic:  cranial nerves II-XII intact and gait normal.     Impression & Recommendations:  Problem # 1:  DIABETES MELLITUS, TYPE II (ICD-250.00) Assessment Unchanged  The following medications were removed from the medication list:    Diovan 160 Mg Tabs (Valsartan) ..... One by  mouth once daily His updated medication list for this problem includes:    Aspirin Ec 325 Mg Tbec (Aspirin) .Marland Kitchen... Take one tablet by mouth daily    Losartan Potassium 50 Mg Tabs (Losartan potassium) ..... One by mouth once daily  Orders: T- Hemoglobin A1C ([id number])  Labs Reviewed: Creat: 1.4 (01/24/2010)     Last Eye Exam: normal (05/07/2009) Reviewed HgBA1c results: 6.1 (01/24/2010)  6.0 (07/27/2009)  Problem # 2:  HYPERTENSION  (ICD-401.9) diovan cost prohibitive.  change to losartan  The following medications were removed from the medication list:    Diovan 160 Mg Tabs (Valsartan) ..... One by mouth once daily His updated medication list for this problem includes:    Carvedilol 12.5 Mg Tabs (Carvedilol) ..... One by mouth two times a day    Amlodipine Besylate 10 Mg Tabs (Amlodipine besylate) ..... One by mouth once daily    Losartan Potassium 50 Mg Tabs (Losartan potassium) ..... One by mouth once daily  Orders: T-Basic Metabolic Panel [phone])  BP today: 134/70 Prior BP: 120/70 (01/31/2010)  Labs Reviewed: K+: 4.1 (01/24/2010) Creat: : 1.4 (01/24/2010)   Chol: 112 (01/24/2010)   HDL: 37.80 (01/24/2010)   LDL: 45 (01/24/2010)   TG: 144.0 (01/24/2010)  Problem # 3:  SMOKER (ICD-305.1) Assessment: Improved pt able to quit using nicotine patch.  pt encouraged to abstain from smoking.    Complete Medication List: 1)  Carvedilol 12.5 Mg Tabs (Carvedilol) .... One by mouth two times a day 2)  Amlodipine Besylate 10 Mg Tabs (Amlodipine besylate) .... One by mouth once daily 3)  Simvastatin 20 Mg Tabs (Simvastatin) .... One by mouth qpm 4)  Colchicine 0.6 Mg Tabs (Colchicine) .... Take 1 tablet by mouth once a day as needed 5)  Aspirin Ec 325 Mg Tbec (Aspirin) .... Take one tablet by mouth daily 6)  Levothyroxine Sodium 50 Mcg Tabs (Levothyroxine sodium) .... One by mouth once daily 7)  Losartan Potassium 50 Mg Tabs (Losartan potassium) .... One by mouth once daily  Patient Instructions: 1)  Monitor your blood pressure at home with new blood pressure medication. 2)  Call our office if you experience significant change in your blood pressure readings. 3)  Please schedule a follow-up appointment in 3 months. Prescriptions: LEVOTHYROXINE SODIUM 50 MCG TABS (LEVOTHYROXINE SODIUM) one by mouth once daily  #90 x 1   Entered and Authorized by:   D. Thomos Lemons DO   Signed by:   D. Thomos Lemons DO on  08/02/2010   Method used:   Electronically to        [facility] ([facility])       [address]       [place], [place]  [address]       Ph: [phone]       Fax: [phone]   RxID:   [id number] AMLODIPINE BESYLATE 10 MG  TABS (AMLODIPINE BESYLATE) one by mouth once daily  #90 x 1   Entered and Authorized by:   D. Thomos Lemons DO   Signed by:   D. Thomos Lemons DO on 08/02/2010   Method used:   Electronically to        [facility] ([facility])       [address]       [place], [place]  [address]       Ph: [phone]       Fax: [phone]   RxID:   [id number] CARVEDILOL 12.5 MG TABS (CARVEDILOL) one by  mouth two times a day  #180 x 1   Entered and Authorized by:   D. Thomos Lemons DO   Signed by:   D. Thomos Lemons DO on 08/02/2010   Method used:   Electronically to        [facility] ([facility])       [address]       [place], [place]  [address]       Ph: [phone]       Fax: [phone]   RxID:   [id number] LOSARTAN POTASSIUM 100 MG TABS (LOSARTAN POTASSIUM) one by mouth once daily  #90 x 3   Entered and Authorized by:   D. Thomos Lemons DO   Signed by:   D. Thomos Lemons DO on 08/02/2010   Method used:   Electronically to        [facility]* (mail-order)       [address]       [place], [place]  [address]       Ph: [phone]       Fax: [phone]   RxID:   [id number]   Current Allergies (reviewed today): No known allergies

## 2010-12-26 NOTE — Assessment & Plan Note (Signed)
Summary: 3 month fu/dt   Vital Signs:  Patient profile:   65 year old male Height:      70 inches Weight:      209.75 pounds BMI:     30.20 O2 Sat:      100 % on Room air Temp:     98.0 degrees F oral Pulse rate:   68 / minute Resp:     18 per minute BP sitting:   140 / 70  (left arm) Cuff size:   large  Vitals Entered By: Glendell Docker CMA (November 04, 2010 2:59 PM)  O2 Flow:  Room air CC: 3 Month Follow up  Is Patient Diabetic? Yes Did you bring your meter with you today? No Pain Assessment Patient in pain? no      Comments evaluation of rash on both feet, low blood sugar 100, high 140 avg 120, [facility]   Primary Care Adilyn Humes:  Dondra Spry DO  CC:  3 Month Follow up .  History of Present Illness:  Hypertension Follow-Up      This is a 65 year old man who presents for Hypertension follow-up.  The patient denies lightheadedness and edema.  The patient denies the following associated symptoms: chest pain.  Compliance with medications (by patient report) has been near 100%.    DM II - stable  c/o itchy rash on top of right foot  renal insuff - stable  Preventive Screening-Counseling & Management  Alcohol-Tobacco     Smoking Status: quit  Allergies (verified): No Known Drug Allergies  Past History:  Past Medical History: Current Problems:  MYOCARDIAL INFARCTION, HX OF (ICD-412) HYPERTENSION (ICD-401.9) HYPERLIPIDEMIA (ICD-272.4)   CORONARY ARTERY DISEASE (ICD-414.00) DIABETIC PERIPHERAL NEUROPATHY (ICD-250.60)  EMBOLISM AND THROMBOSIS, ARTERY (ICD-444.9) BENIGN PROSTATIC HYPERTROPHY (ICD-600.00) DIABETES MELLITUS, TYPE II (ICD-250.00) DEPRESSION (ICD-311) SMOKER (ICD-305.1) Syncopal episode most likely vasovagal.  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   Lungs:  normal respiratory effort and normal breath sounds.   Heart:  normal rate, regular rhythm, and no gallop.   Abdomen:  soft, non-tender, and normal bowel sounds.   Skin:  scaly erythematous 1 cm patch top of right foot Psych:  normally interactive, good eye contact, not anxious appearing, and not depressed appearing.     Impression & Recommendations:  Problem # 1:  HYPERTENSION (ICD-401.9) Assessment Unchanged  His updated medication list for this problem includes:    Carvedilol 25 Mg Tabs (Carvedilol) ..... One by mouth bid    Amlodipine Besylate 10 Mg Tabs (Amlodipine besylate) ..... One by mouth once daily    Losartan Potassium 25 Mg Tabs (Losartan potassium) ..... One by mouth once daily  Orders: T-Basic Metabolic Panel [id number])  BP today: 140/70 Prior BP: 134/70 (08/02/2010)  Labs Reviewed: K+: 4.5 (09/18/2010) Creat: : 1.6 (09/18/2010)   Chol: 112 (01/24/2010)   HDL: 37.80 (01/24/2010)   LDL: 45 (01/24/2010)   TG: 144.0 (01/24/2010)  Problem # 2:  RENAL INSUFFICIENCY, CHRONIC (ICD-585.9)  Orders: Cardiology Referral (Cardiology)  Complete Medication List: 1)  Carvedilol 25 Mg Tabs (Carvedilol) .... One by mouth bid 2)  Amlodipine Besylate 10 Mg Tabs (Amlodipine besylate) .... One by mouth once daily 3)  Simvastatin 20 Mg Tabs (Simvastatin) .... One by mouth  qpm 4)  Colchicine 0.6 Mg Tabs (Colchicine) .... Take 1 tablet by mouth once a day as needed 5)  Aspirin Ec 325 Mg Tbec (Aspirin) .... Take one tablet by mouth daily 6)  Levothyroxine Sodium 50 Mcg Tabs (Levothyroxine sodium) .... One by mouth once daily 7)  Losartan Potassium 25 Mg Tabs (Losartan potassium) .... One by mouth once daily  Other Orders: T- Hemoglobin A1C ([id number])  Patient Instructions: 1)  Please schedule a follow-up appointment in 2  months. Prescriptions: CLOTRIMAZOLE-BETAMETHASONE 1-0.05 % CREA (CLOTRIMAZOLE-BETAMETHASONE) use two times a day x 2 weeks  #30 grams x 1   Entered and Authorized by:   D. Thomos Lemons DO   Signed by:   D. Thomos Lemons DO on 11/04/2010   Method used:   [facility]        [id number]       [address]       [place], [place]  [address]       Ph: [phone]       Fax: [phone]   RxID:   [id number] CARVEDILOL 25 MG TABS (CARVEDILOL) one by mouth bid  #180 x 1   Entered and Authorized by:   D. Thomos Lemons DO   Signed by:   D. Thomos Lemons DO on 11/04/2010   Method used:   Electronically to        [facility] (mail-order)       [address]       [place], [place]  [address]       Ph: [phone]       Fax: [phone]   RxID:   [id number]    Orders Added: 1)  T-Basic Metabolic Panel [id number] 2)  T- Hemoglobin A1C [[id number]] 3)  Cardiology Referral [Cardiology] 4)  Est. Patient Level III [[address]]   Immunization History:  Influenza Immunization History:    Influenza:  historical (10/08/2010)   Immunization History:  Influenza Immunization History:    Influenza:  Historical (10/08/2010)  Current Allergies (reviewed today): No known allergies

## 2010-12-26 NOTE — Letter (Signed)
   [place] at [facility] [address] [place], [place]  [address]  [place] Phone: [phone]      November 05, 2010   Fernando Combs [address] [place], [place] [address]  RE:  LAB RESULTS  Dear  Mr. Scioli,  The following is an interpretation of your most recent lab tests.  Please take note of any instructions provided or changes to medications that have resulted from your lab work.  ELECTROLYTES:  Good - no changes needed  KIDNEY FUNCTION TESTS:  Good - no changes needed    DIABETIC STUDIES:  Excellent - no changes needed Blood Glucose: 87   HgbA1C: 5.6   Microalbumin/Creatinine Ratio: 228.6          Sincerely Yours,    Dr. Thomos Lemons  Appended Document:  mailed

## 2010-12-26 NOTE — Progress Notes (Signed)
Summary: Lab Results  Phone Note Outgoing Call   Summary of Call: call pt - kidney function slightly improved.   we will recheck at next OV Initial call taken by: D. Thomos Lemons DO,  September 18, 2010 7:35 PM  Follow-up for Phone Call        call placed to patient at [phone], voice recording reached,stating phone number is no longer in service, or has been changed. Follow-up by: Glendell Docker CMA,  September 19, 2010 9:00 AM  Additional Follow-up for Phone Call Additional follow up Details #1::        attempted to reach patient at [phone], recording reached stating the number has been disconnected, changed or no longer in service.  Additional Follow-up by: Glendell Docker CMA,  September 23, 2010 8:14 AM

## 2010-12-26 NOTE — Assessment & Plan Note (Signed)
Summary: [facility]   Visit Type:  2 months follow up Primary Provider:  Dondra Spry DO  CC:  No complains.  History of Present Illness: 65 yo male with past medical history of coronary artery disease status post coronary artery bypass graft in 2001 who I saw for syncope in November of 2010. 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 This apparently was felt to need medical therapy. When we saw him previously we scheduled an echocardiogram which was performed on October 29, 2009 that showed an ejection fraction of 60%. A myoview was also performed and showed inferior basal scar but no ischemia. Ejection fraction was 49%. An event monitor showed sinus rhythm with a rare PAC and PVC. Since I last saw him the patient has dyspnea with more extreme activities and not with routine activities. It is relieved with rest. It is not associated with chest pain. There is no orthopnea, PND or pedal edema. There is no syncope or palpitations. There is no exertional chest pain.   Preventive Screening-Counseling & Management  Alcohol-Tobacco     Smoking Status: current  Current Medications (verified): 1)  Carvedilol 12.5 Mg Tabs (Carvedilol) .... One By Mouth Two Times A Day 2)  Diovan 160 Mg Tabs (Valsartan) .... One By Mouth Once Daily 3)  Amlodipine Besylate 10 Mg  Tabs (Amlodipine Besylate) .... One By Mouth Once Daily 4)  Simvastatin 40 Mg Tabs (Simvastatin) .... One By Mouth Qpm 5)  Colchicine 0.6 Mg Tabs (Colchicine) .... Take 1 Tablet By Mouth Once A Day As Needed 6)  Aspirin Ec 325 Mg Tbec (Aspirin) .... Take One Tablet By Mouth Daily  Allergies (verified): No Known Drug Allergies  Past History:  Past Medical History: Reviewed history from 10/10/2009 and no changes required. Current Problems:  MYOCARDIAL INFARCTION, HX OF (ICD-412) HYPERTENSION (ICD-401.9) HYPERLIPIDEMIA (ICD-272.4) CORONARY ARTERY DISEASE  (ICD-414.00) DIABETIC PERIPHERAL NEUROPATHY (ICD-250.60) EMBOLISM AND THROMBOSIS, ARTERY (ICD-444.9) BENIGN PROSTATIC HYPERTROPHY (ICD-600.00) DIABETES MELLITUS, TYPE II (ICD-250.00) DEPRESSION (ICD-311) SMOKER (ICD-305.1) Syncopal episode most likely vasovagal.  Acute thrombus of his right superficial femoral artery with claudication (04/2008)  Diabetic polyneuropathy  Hx of ischemic colitis - 06/2005  Past Surgical History: Reviewed history from 10/09/2009 and no changes required. Lower Arterial Duplex thromboembolectomyof his right superficial femoral artery 04/2008   Coronary artery bypass grafting times four (left internal mammary artery to the left anterior descending, saphenous vein graft to diagonal,saphenous vein graft to obtuse marginal, saphenous vein graft to posterior descending).SURGEON:  Kathlee Nations Trigt III, M.D.ASSISTANT:  Areta Haber, [place].A.ANESTHESIA:  General Proc. Date: 09/11/01Adm. Date:  [id number]  Social History: Reviewed history from 10/10/2009 and no changes required. Occupation:  Works at [employer] - lives with wife (2nd marriage) 3 children   Alcohol use-no Tobacco Use - Yes.   Review of Systems       no fevers or chills, productive cough, hemoptysis, dysphasia, odynophagia, melena, hematochezia, dysuria, hematuria, rash, seizure activity, orthopnea, PND, pedal edema, claudication. Remaining systems are negative.   Vital Signs:  Patient profile:   65 year old male Height:      70 inches Weight:      210.25 pounds BMI:     30.28 Pulse rate:   78 / minute Pulse rhythm:   regular Resp:     18 per minute BP sitting:   140 / 62  (left arm) Cuff size:   large  Vitals Entered By: Vikki Ports (December 12, 2009 8:58 AM)  Physical Exam  General:  Well-developed well-nourished in no acute distress.  Skin is warm and dry.  HEENT is normal.  Neck is supple. No thyromegaly.  Chest is clear to auscultation with normal expansion.    Cardiovascular exam is regular rate and rhythm.  Abdominal exam nontender or distended. No masses palpated. Extremities show no edema. neuro grossly intact    Impression & Recommendations:  Problem # 1:  HYPERTENSION (ICD-401.9) Blood pressure is adequately controlled on present medications. Will continue. The following medications were removed from the medication list:    Aspirin Low Dose 81 Mg Tabs (Aspirin) ..... One by mouth once daily His updated medication list for this problem includes:    Carvedilol 12.5 Mg Tabs (Carvedilol) ..... One by mouth two times a day    Diovan 160 Mg Tabs (Valsartan) ..... One by mouth once daily    Amlodipine Besylate 10 Mg Tabs (Amlodipine besylate) ..... One by mouth once daily    Aspirin Ec 325 Mg Tbec (Aspirin) .Marland Kitchen... Take one tablet by mouth daily  The following medications were removed from the medication list:    Aspirin Low Dose 81 Mg Tabs (Aspirin) ..... One by mouth once daily His updated medication list for this problem includes:    Carvedilol 12.5 Mg Tabs (Carvedilol) ..... One by mouth two times a day    Diovan 160 Mg Tabs (Valsartan) ..... One by mouth once daily    Amlodipine Besylate 10 Mg Tabs (Amlodipine besylate) ..... One by mouth once daily    Aspirin Ec 325 Mg Tbec (Aspirin) .Marland Kitchen... Take one tablet by mouth daily  Problem # 2:  HYPERLIPIDEMIA (ICD-272.4) Continue statin. Lipids and liver monitored by his primary care. His updated medication list for this problem includes:    Simvastatin 40 Mg Tabs (Simvastatin) ..... One by mouth qpm  Problem # 3:  CORONARY ARTERY DISEASE (ICD-414.00) Continue aspirin, beta blocker and statin. The following medications were removed from the medication list:    Aspirin Low Dose 81 Mg Tabs (Aspirin) ..... One by mouth once daily His updated medication list for this problem includes:    Carvedilol 12.5 Mg Tabs (Carvedilol) ..... One by mouth two times a day    Amlodipine Besylate 10 Mg Tabs  (Amlodipine besylate) ..... One by mouth once daily    Aspirin Ec 325 Mg Tbec (Aspirin) .Marland Kitchen... Take one tablet by mouth daily  Problem # 4:  SYNCOPE (ICD-780.2) No further episodes. He may need implantable loop The following medications were removed from the medication list:    Aspirin Low Dose 81 Mg Tabs (Aspirin) ..... One by mouth once daily His updated medication list for this problem includes:    Carvedilol 12.5 Mg Tabs (Carvedilol) ..... One by mouth two times a day    Amlodipine Besylate 10 Mg Tabs (Amlodipine besylate) ..... One by mouth once daily    Aspirin Ec 325 Mg Tbec (Aspirin) .Marland Kitchen... Take one tablet by mouth daily  Problem # 5:  DIABETES MELLITUS, TYPE II (ICD-250.00)  The following medications were removed from the medication list:    Aspirin Low Dose 81 Mg Tabs (Aspirin) ..... One by mouth once daily His updated medication list for this problem includes:    Diovan 160 Mg Tabs (Valsartan) ..... One by mouth once daily    Aspirin Ec 325 Mg Tbec (Aspirin) .Marland Kitchen... Take one tablet by mouth daily  Problem # 6:  SMOKER (ICD-305.1) Patient  counseled on discontinuing for between 3-10 minutes.  Problem # 7:  BENIGN PROSTATIC HYPERTROPHY (ICD-600.00)  Patient Instructions: 1)  Your physician recommends that you schedule a follow-up appointment in: 6 MONTHS

## 2010-12-26 NOTE — Progress Notes (Signed)
Summary: Lab Results  Phone Note Outgoing Call   Summary of Call: call pt - thyroid blood tests shows pt needs higher dose.  see rx.  repeat TSH in 2 months.   please schedule Initial call taken by: D. Thomos Lemons DO,  Apr 03, 2010 10:30 PM  Follow-up for Phone Call        attempted to contact patient at [phone], no answer, detailed voice message left informing patient of medication change and lab work. Message left for patient to return call regarding location of blood draw. Follow-up by: Glendell Docker CMA,  Apr 04, 2010 2:32 PM  Additional Follow-up for Phone Call Additional follow up Details #1::        Left message on machine to return my call.  Mervin Kung CMA  Apr 05, 2010 2:02 PM   Pt returned my call. States he will go to the [place] office for labs. Appt scheduled for 06/04/10.  Mervin Kung CMA  Apr 08, 2010 2:45 PM     New/Updated Medications: LEVOTHYROXINE SODIUM 50 MCG TABS (LEVOTHYROXINE SODIUM) one by mouth once daily Prescriptions: LEVOTHYROXINE SODIUM 50 MCG TABS (LEVOTHYROXINE SODIUM) one by mouth once daily  #30 x 2   Entered and Authorized by:   D. Thomos Lemons DO   Signed by:   D. Thomos Lemons DO on 04/03/2010   Method used:   [facility]        [facility] [address] [id number]       [address]       [place], [place]  [address]       Ph: [phone]       Fax: [phone]   RxID:   [id number]

## 2010-12-26 NOTE — Progress Notes (Signed)
Summary: Lab Results  Phone Note Outgoing Call   Summary of Call: call pt - kidney function slightly improved.  pt should be taking 1/2 of losartan 50 mg arrange repeat BMET in 1 month Initial call taken by: D. Thomos Lemons DO,  August 21, 2010 11:56 AM  Follow-up for Phone Call        call placed to patient at [phone], he has been advised per Dr Artist Pais instructions. He has confirmed that he is taking 1/2 of 50mg  tablets of the Losartan Follow-up by: Glendell Docker CMA,  August 21, 2010 1:13 PM    New/Updated Medications: LOSARTAN POTASSIUM 50 MG TABS (LOSARTAN POTASSIUM) one half tab by mouth once daily

## 2010-12-26 NOTE — Letter (Signed)
Summary: Generic Letter  [place] at [facility]  [address]   [place], [place] [address]   Phone: [phone]  Fax: [phone]      08/01/2010     Fernando Combs [address] [place], [place]  [address]      Dear [name],  I have attempted to contact you at [phone], and have been unsuccessful. Dr Artist Pais asked that I inform you that a recent drug bulletin warns agains potential interaction with Sinmvastatin and Amlodipine. Dr. Artist Pais is reducing your Simvastatin to 20mg . You may finish your current prescription of 40 mg by taking 1/2 of the tablet. [facility]s [facility]   If there are any  questions please contact our office          Sincerely,   Glendell Docker CMA Dr Thomos Lemons

## 2010-12-26 NOTE — Progress Notes (Signed)
Summary: rx clarification  Phone Note From Pharmacy   Caller: [facility] Summary of Call: Received fax from [facility] wanting clarification on Losartan rx. Should it be 100mg  or 50mg ? Per phone note of 9/12, advised Debby that rx should be for Losartan 50mg  1 once daily. #90 x 1 refill. Nicki Guadalajara Fergerson CMA Duncan Dull)  August 15, 2010 2:56 PM

## 2010-12-30 ENCOUNTER — Encounter: Payer: Self-pay | Admitting: Internal Medicine

## 2010-12-30 ENCOUNTER — Encounter (HOSPITAL_BASED_OUTPATIENT_CLINIC_OR_DEPARTMENT_OTHER): Payer: Self-pay

## 2010-12-30 ENCOUNTER — Other Ambulatory Visit: Payer: Self-pay | Admitting: Internal Medicine

## 2010-12-30 ENCOUNTER — Ambulatory Visit (INDEPENDENT_AMBULATORY_CARE_PROVIDER_SITE_OTHER): Payer: MEDICARE | Admitting: Internal Medicine

## 2010-12-30 ENCOUNTER — Ambulatory Visit (HOSPITAL_BASED_OUTPATIENT_CLINIC_OR_DEPARTMENT_OTHER)
Admission: RE | Admit: 2010-12-30 | Discharge: 2010-12-30 | Disposition: A | Discharge status: Home / Self Care | Payer: Medicare Other | Source: Ambulatory Visit | Attending: Internal Medicine | Admitting: Internal Medicine

## 2010-12-30 ENCOUNTER — Telehealth: Payer: Self-pay | Admitting: Internal Medicine

## 2010-12-30 DIAGNOSIS — E119 Type 2 diabetes mellitus without complications: Secondary | ICD-10-CM

## 2010-12-30 DIAGNOSIS — M549 Dorsalgia, unspecified: Secondary | ICD-10-CM | POA: Insufficient documentation

## 2010-12-30 DIAGNOSIS — Z951 Presence of aortocoronary bypass graft: Secondary | ICD-10-CM | POA: Insufficient documentation

## 2010-12-30 DIAGNOSIS — E785 Hyperlipidemia, unspecified: Secondary | ICD-10-CM

## 2010-12-30 DIAGNOSIS — I739 Peripheral vascular disease, unspecified: Secondary | ICD-10-CM | POA: Insufficient documentation

## 2010-12-30 LAB — CONVERTED CEMR LAB
BUN: 25 mg/dL — ABNORMAL HIGH (ref 6–23)
CO2: 23 meq/L (ref 19–32)
Calcium: 9.8 mg/dL (ref 8.4–10.5)
Chloride: 103 meq/L (ref 96–112)
Creatinine, Ser: 1.59 mg/dL — ABNORMAL HIGH (ref 0.40–1.50)
Glucose, Bld: 133 mg/dL — ABNORMAL HIGH (ref 70–99)
HCT: 45.4 % (ref 39.0–52.0)
Hemoglobin: 15.1 g/dL (ref 13.0–17.0)
MCHC: 33.3 g/dL (ref 30.0–36.0)
MCV: 87 fL (ref 78.0–100.0)
Platelets: 259 10*3/uL (ref 150–400)
Potassium: 4.6 meq/L (ref 3.5–5.3)
RBC: 5.22 M/uL (ref 4.22–5.81)
RDW: 15.2 % (ref 11.5–15.5)
Sodium: 139 meq/L (ref 135–145)
WBC: 10 10*3/uL (ref 4.0–10.5)

## 2010-12-31 ENCOUNTER — Encounter: Payer: Self-pay | Admitting: Internal Medicine

## 2011-01-05 NOTE — Op Note (Addendum)
NAME:  Fernando Combs, Fernando Combs               ACCOUNT NO.:  [id number]  MEDICAL RECORD NO.:  [id number]          PATIENT TYPE:  AMB  LOCATION:  SDS                          FACILITY:  [facility]  PHYSICIAN:  Juleen China IV, MDDATE OF BIRTH:  June 21, 1946  DATE OF PROCEDURE:  12/17/2010 DATE OF DISCHARGE:                              OPERATIVE REPORT   PREOPERATIVE DIAGNOSIS:  Right leg claudication.  POSTOPERATIVE DIAGNOSIS:  Right leg claudication.  PROCEDURE PERFORMED: 1. Ultrasound access, left femoral artery. 2. Abdominal aortogram. 3. Right leg runoff. 4. Stent, right superficial femoral artery.  INDICATIONS:  This is a 65 year old gentleman with lifestyle-limiting claudication of right leg.  Ultrasound identified a high-grade right superficial femoral artery stenosis.  He comes today for arteriogram, possible intervention.  PROCEDURE:  The patient was identified in the holding area and taken to room A, placed supine on the table.  Both groins were prepped and draped in usual fashion.  Time-out was called.  Left femoral artery was evaluated with ultrasound, it was found to be widely patent.  Digital image was acquired, it was accessed under ultrasound guidance.  An 0.035 wire was advanced in the aorta under fluoroscopic visualization, and a 5- French sheath was placed over the wire and Omniflush catheter was advanced to the level of L1.  Abdominal aortogram was obtained.  Next, the aortic bifurcation was crossed using the Omniflush catheter and Bentson wire.  Catheter was placed in the right external iliac artery, and right leg runoff was performed.  FINDINGS:  Aortogram:  The visualized portions of the suprarenal abdominal aorta showed minimal disease.  There was a mild right renal artery stenosis, less than 50%.  The left renal artery was widely patent.  The infrarenal abdominal aorta was heavily calcified and widely patent.  The left common iliac artery was widely patent.  The  left external iliac and internal iliac arteries were widely patent.  There are flow irregularities within the proximal right common iliac artery, consistent with ulceration versus plaque, this is not hemodynamically significant.  The right external iliac artery is widely patent.  The right internal iliac artery is widely patent.  Right lower extremity:  The right common femoral artery is widely patent.  The right profunda femoral artery is widely patent.  Right superficial femoral artery is patent; however, in its midportion, there was an area of high-grade stenosis, approximately 90%.  The popliteal artery is widely patent.  There is single-vessel runoff via the anterior tibial artery.  There is reconstitution of the peroneal artery. Posterior tibial artery is occluded.  At this point in time, the decision was made to intervene over a Rosen wire.  The 5-French sheath was removed, and the 7-French Ansel sheath was placed.  A Versacore wire was used to traverse the stenosis.  Once the stenosis was crossed, I elected to primary stent this area.  An Absolute Pro 7 x 60 stent was successfully deployed and molded to confirmation with a 6-mm balloon.  Completion arteriogram performed revealed resolution of the stenosis and runoff similar to preintervention.  At this point in time, decision was made to terminate the  procedure.  Catheters and wires were removed.  The patient was taken to the holding area for sheath pull, once his coagulation profile corrects as he was heparinized throughout the procedure.  IMPRESSION: 1. High-grade right superficial femoral artery stenosis, successfully     treated using an Abbott Absolute Pro 7 x 60 self-expanding stent. 2. Single-vessel runoff via the anterior tibial artery with     reconstitution of the peroneal artery.     Jorge Ny, MD     VWB/MEDQ  D:  12/17/2010  T:  12/18/2010  Job:  [id number]  Electronically Signed by Arelia Longest IV  MD on 01/05/2011 09:58:37 PM

## 2011-01-09 NOTE — Letter (Signed)
   [place] at [facility] [address] [place], [place]  [address]  [place] Phone: [phone]      December 31, 2010   Fernando Combs [address] [place], [place] [address]  RE:  LAB RESULTS  Dear  Mr. Huneke,  The following is an interpretation of your most recent lab tests.  Please take note of any instructions provided or changes to medications that have resulted from your lab work.  ELECTROLYTES:  Good - no changes needed  KIDNEY FUNCTION TESTS:  Good - no changes needed     CBC:  Good - no changes needed       Sincerely Yours,    Dr. Thomos Lemons  Appended Document:  mailed

## 2011-01-09 NOTE — Progress Notes (Signed)
Summary: CXR results  Phone Note Outgoing Call   Summary of Call: call pt - CXR is normal Initial call taken by: D. Thomos Lemons DO,  December 30, 2010 10:58 AM  Follow-up for Phone Call        call placed to patient at [phone], patients wife answered,stating she was not at home. A message was left for patient to return call. Follow-up by: Glendell Docker CMA,  December 31, 2010 11:55 AM  Additional Follow-up for Phone Call Additional follow up Details #1::        pt returned your phone call. phone# [phone]. please assist. Additional Follow-up by: Elba Barman,  December 31, 2010 2:15 PM    Additional Follow-up for Phone Call Additional follow up Details #2::    Pt notified.  Follow-up by: Mervin Kung CMA Duncan Dull),  December 31, 2010 3:49 PM

## 2011-01-21 ENCOUNTER — Ambulatory Visit (INDEPENDENT_AMBULATORY_CARE_PROVIDER_SITE_OTHER): Payer: Medicare Other | Admitting: Vascular Surgery

## 2011-01-21 ENCOUNTER — Encounter (INDEPENDENT_AMBULATORY_CARE_PROVIDER_SITE_OTHER): Payer: Medicare Other

## 2011-01-21 DIAGNOSIS — Z48812 Encounter for surgical aftercare following surgery on the circulatory system: Secondary | ICD-10-CM

## 2011-01-21 DIAGNOSIS — I739 Peripheral vascular disease, unspecified: Secondary | ICD-10-CM

## 2011-01-21 DIAGNOSIS — I70219 Atherosclerosis of native arteries of extremities with intermittent claudication, unspecified extremity: Secondary | ICD-10-CM

## 2011-01-21 NOTE — Assessment & Plan Note (Signed)
Summary: 2 month follow up/mhf--rm 3   Vital Signs:  Patient profile:   65 year old male Height:      70 inches Weight:      206 pounds BMI:     29.66 Temp:     97.5 degrees F oral Pulse rate:   72 / minute Pulse rhythm:   regular Resp:     18 per minute BP sitting:   108 / 68  (right arm) Cuff size:   regular  Vitals Entered By: Mervin Kung CMA Duncan Dull) (December 30, 2010 8:36 AM) CC: Pt here for 2 month follow up Is Patient Diabetic? Yes Comments Pt now takes Plavix 75mg  once daily. Agrees all other med doses and directions are correct. Mervin Kung CMA Duncan Dull)  December 30, 2010 8:40 AM    Primary Care Provider:  Dondra Spry DO  CC:  Pt here for 2 month follow up.  History of Present Illness: 65 y/o with hx of DM II, CAD, and PAD for follow up  int hx seen by Dr Hart Rochester carotids are stable pt c/o right leg pain pt found to have PAD in right leg pt had angioplasty and stent in right femoral artery he was started on plavix (for any least 3 months) no abnormal bleeding  c/o intermittent left upper back pain with taking deep breath no cough  DM II - stable Htn - stable  Allergies (verified): No Known Drug Allergies  Past History:  Past Medical History: Current Problems:  MYOCARDIAL INFARCTION, HX OF (ICD-412) HYPERTENSION (ICD-401.9) HYPERLIPIDEMIA (ICD-272.4)   CORONARY ARTERY DISEASE (ICD-414.00) DIABETIC PERIPHERAL NEUROPATHY (ICD-250.60)  EMBOLISM AND THROMBOSIS, ARTERY (ICD-444.9) BENIGN PROSTATIC HYPERTROPHY (ICD-600.00) DIABETES MELLITUS, TYPE II (ICD-250.00) DEPRESSION (ICD-311) Hx of tobacco use Syncopal episode most likely vasovagal.  Acute thrombus of his right superficial femoral artery with claudication (04/2008)  Diabetic polyneuropathy  Hx of ischemic colitis - 06/2005  Past Surgical History: Lower Arterial Duplex  thromboembolectomyof his right superficial femoral artery 04/2008   Coronary artery bypass grafting times four  (left internal mammary artery to the left anterior descending, saphenous vein graft to diagonal,saphenous vein graft to obtuse marginal, saphenous vein graft to posterior descending).SURGEON:  Kathlee Nations Trigt III, M.D.ASSISTANT:  Areta Haber, [place].A.ANESTHESIA:  General Proc. Date: 09/11/01Adm. Date:  [id number]   Angiogram, right leg stent placement--11/2010  Dr Hart Rochester  Family History: Alcoholism Stroke CAD Hyperlipidemia Denies family hx of blood clots or blood disorder       Social History: Occupation:  Works at [employer] - lives with wife (2nd marriage) 3 children   Alcohol use-no Tobacco Use - Yes.       Physical Exam  Lungs:  normal respiratory effort and normal breath sounds.   Heart:  normal rate, regular rhythm, and no gallop.   Pulses:  bounding PD pulse right foot,  palpable PD pulse left foot Extremities:  trace left pedal edema and trace right pedal edema.   Neurologic:  cranial nerves II-XII intact.     Impression & Recommendations:  Problem # 1:  HYPERTENSION (ICD-401.9) Assessment Improved  His updated medication list for this problem includes:    Carvedilol 25 Mg Tabs (Carvedilol) ..... One by mouth bid    Amlodipine Besylate 10 Mg Tabs (Amlodipine besylate) ..... One by mouth once daily    Losartan Potassium 25 Mg Tabs (Losartan potassium) ..... One by mouth once daily  BP today: 108/68 Prior BP: 140/70 (11/04/2010)  Labs Reviewed: K+: 4.4 (11/04/2010) Creat: :  1.43 (11/04/2010)   Chol: 112 (01/24/2010)   HDL: 37.80 (01/24/2010)   LDL: 45 (01/24/2010)   TG: 144.0 (01/24/2010)  Orders: T-Basic Metabolic Panel ([id number])  Problem # 2:  BACK PAIN, LEFT (ICD-724.5)  pt notes pain in upper back with deep breath  His updated medication list for this problem includes:    Aspirin Ec 325 Mg Tbec (Aspirin) .Marland Kitchen... Take one tablet by mouth daily  Orders: CXR- 2view (CXR)  Problem # 3:  HYPERLIPIDEMIA (ICD-272.4)  His updated medication list for  this problem includes:    Atorvastatin Calcium 40 Mg Tabs (Atorvastatin calcium) ..... One by mouth once daily  Labs Reviewed: SGOT: 21 (01/24/2010)   SGPT: 24 (01/24/2010)   HDL:37.80 (01/24/2010), 31.80 (07/27/2009)  LDL:45 (01/24/2010), 60 (07/27/2009)  Chol:112 (01/24/2010), 119 (07/27/2009)  Trig:144.0 (01/24/2010), 134.0 (07/27/2009)  Complete Medication List: 1)  Carvedilol 25 Mg Tabs (Carvedilol) .... One by mouth bid 2)  Amlodipine Besylate 10 Mg Tabs (Amlodipine besylate) .... One by mouth once daily 3)  Atorvastatin Calcium 40 Mg Tabs (Atorvastatin calcium) .... One by mouth once daily 4)  Colchicine 0.6 Mg Tabs (Colchicine) .... Take 1 tablet by mouth once a day as needed 5)  Aspirin Ec 325 Mg Tbec (Aspirin) .... Take one tablet by mouth daily 6)  Levothyroxine Sodium 50 Mcg Tabs (Levothyroxine sodium) .... One by mouth once daily 7)  Losartan Potassium 25 Mg Tabs (Losartan potassium) .... One by mouth once daily 8)  Plavix 75 Mg Tabs (Clopidogrel bisulfate) .... Take 1 tablet by mouth once a day.  Other Orders: T-CBC No Diff ([id number])  Patient Instructions: 1)  Please schedule a follow-up appointment in 4 months. 2)  BMP prior to visit, ICD-9: 401.9 3)  Lipid Panel prior to visit, ICD-9: 272.4 4)  LFTs:  272.4 5)  HbgA1C prior to visit, ICD-9: 790.29 6)  Please return for lab work one (1) week before your next appointment.  Prescriptions: ATORVASTATIN CALCIUM 40 MG TABS (ATORVASTATIN CALCIUM) one by mouth once daily  #90 x 1   Entered and Authorized by:   D. Thomos Lemons DO   Signed by:   D. Thomos Lemons DO on 12/30/2010   Method used:   Electronically to        [id number] ([facility]       [address]       [place], [place]  [address]       Ph: [phone]       Fax: [phone]   RxID:   [id number]    Orders Added: 1)  CXR- 2view [CXR] 2)  T-Basic Metabolic Panel [[id number]] 3)  T-CBC No Diff [[id number]] 4)   Est. Patient Level III [41660]    Current Allergies (reviewed today): No known allergies

## 2011-01-22 NOTE — Assessment & Plan Note (Signed)
OFFICE VISIT  BONHAM, ZINGALE DOB:  1946/05/08                                       01/21/2011 ZOXWR#:[id number]  The patient returns today for initial follow-up regarding his recent intervention performed by Dr. Juleen China on the 01/17/2011 for right leg claudication secondary to severe right superficial femoral artery stenosis.  He was treated with PTA and stenting of his right superficial femoral artery with an excellent result using a 70 x 60 self- expanding stent.  He has one vessel runoff to the anterior tibial artery.  He has excellent resolution of his claudication symptoms,  now having the ability to walk as long a  distance as he likes with no calf discomfort in either leg.  He did state that Dr. Barbette Hair. Artist Pais was concerned about his renal function being slightly abnormal and his blood pressure being more difficult to control and was worried about a renovascular workup.  I have reviewed his angiogram which we just performed which reveals a less than 50% narrowing of his right renal artery and widely patent left renal artery which would rule out severe renovascular disease.  He does not need a renal duplex exam.  CHRONIC MEDICAL PROBLEMS: 1. Coronary artery disease. 2. Diabetes mellitus. 3. Hypertension. 4. Hyperlipidemia. 5. History of tobacco abuse.  FAMILY HISTORY:  Negative for coronary artery disease, diabetes and stroke.  REVIEW OF SYSTEMS:  Negative for chest pain, dyspnea on exertion, PND, orthopnea, chronic bronchitis.  He denies all other system symptoms in complete review of systems.  PHYSICAL EXAMINATION:  Blood pressure 137-74, heart rate 68, respirations 14, temperature 98.  General:  He is a well-developed, well- nourished male in no apparent distress.  HEENT:  Exam is normal for age. EOMs intact.  Lungs:  Clear to auscultation.  No rhonchi or wheezing. Cardiovascular:  Regular rhythm; no murmurs.  Carotid pulses 3+,  no audible bruits.  Abdomen:  Soft, nontender with no masses.  Lower extremity exam:  Reveals 3+ femoral, popliteal and dorsalis pedis pulse on the right, 3+ femoral, popliteal and 2+ posterior tibial pulse on the left.  Today I ordered lower extremity arterial Dopplers which revealed an ABI of 1.2 on the right and 0.94 on the left with triphasic flow.  I reassured him regarding his results and he will be followed on a regular basis in the vascular lab for patency of his stent in his right superficial femoral artery.  He will continue the Plavix for the 21-month period and then revert to taking aspirin.    Quita Skye Hart Rochester, M.D. Electronically Signed  JDL/MEDQ  D:  01/21/2011  T:  01/22/2011  Job:  [id number]  cc:   Barbette Hair. Artist Pais, DO

## 2011-02-05 ENCOUNTER — Telehealth: Payer: Self-pay | Admitting: Internal Medicine

## 2011-02-11 NOTE — Progress Notes (Signed)
Summary: refill-ledoxyl  Phone Note Call from Patient Call back at Home Phone [phone]   Reason for Call: Refill Medication Summary of Call: pt called stating that he is out of ledoxyl? and that he called [facility] for [facility]ill and they told him that his drs office had to call them. p# [phone]. please assist. Initial call taken by: Elba Barman,  February 05, 2011 11:10 AM  Follow-up for Phone Call        Phone Call Completed, rx sent electonically to pharmacy, patient aware Follow-up by: Glendell Docker CMA,  February 05, 2011 12:51 PM    Prescriptions: LEVOTHYROXINE SODIUM 50 MCG TABS (LEVOTHYROXINE SODIUM) one by mouth once daily  #90 x 1   Entered by:   Glendell Docker CMA   Authorized by:   D. Thomos Lemons DO   Signed by:   Glendell Docker CMA on 02/05/2011   Method used:   Electronically to        [facility] (mail-order)       [address]       [place], [place]  [address]       Ph: [phone]       Fax: [phone]   RxID:   [id number]

## 2011-02-26 LAB — GLUCOSE, CAPILLARY

## 2011-02-26 LAB — CK TOTAL AND CKMB (NOT AT ARMC)
CK, MB: 0.8 ng/mL (ref 0.3–4.0)
Relative Index: INVALID (ref 0.0–2.5)
Total CK: 62 U/L (ref 7–232)
Total CK: 67 U/L (ref 7–232)

## 2011-02-26 LAB — POCT CARDIAC MARKERS

## 2011-02-26 LAB — COMPREHENSIVE METABOLIC PANEL
Alkaline Phosphatase: 63 U/L (ref 39–117)
BUN: 20 mg/dL (ref 6–23)
Creatinine, Ser: 1.37 mg/dL (ref 0.4–1.5)
Glucose, Bld: 101 mg/dL — ABNORMAL HIGH (ref 70–99)
Potassium: 3.8 mEq/L (ref 3.5–5.1)
Total Protein: 6.8 g/dL (ref 6.0–8.3)

## 2011-02-26 LAB — POCT I-STAT, CHEM 8
BUN: 25 mg/dL — ABNORMAL HIGH (ref 6–23)
Calcium, Ion: 1.07 mmol/L — ABNORMAL LOW (ref 1.12–1.32)
TCO2: 22 mmol/L (ref 0–100)

## 2011-02-26 LAB — LIPID PANEL
Cholesterol: 89 mg/dL (ref 0–200)
HDL: 27 mg/dL — ABNORMAL LOW (ref >39)

## 2011-02-26 LAB — CBC
HCT: 42.3 % (ref 39.0–52.0)
MCV: 86.8 fL (ref 78.0–100.0)
RBC: 4.87 MIL/uL (ref 4.22–5.81)
WBC: 9.5 10*3/uL (ref 4.0–10.5)

## 2011-02-26 LAB — HEMOGLOBIN A1C
Hgb A1c MFr Bld: 5.8 % (ref 4.6–6.1)
Mean Plasma Glucose: 120 mg/dL

## 2011-02-26 LAB — TROPONIN I
Troponin I: 0.01 ng/mL (ref 0.00–0.06)
Troponin I: 0.01 ng/mL (ref 0.00–0.06)

## 2011-03-04 ENCOUNTER — Telehealth: Payer: Self-pay | Admitting: *Deleted

## 2011-03-04 NOTE — Telephone Encounter (Signed)
Fernando Combs with [facility] called and left voice message stating they are changing manufacturers and and in order to fill patient rx for Levoxy  It was a requirement the physician be informed and approval for change was needed from provider. She is requesting a return phone call regarding authorization for change

## 2011-03-06 ENCOUNTER — Telehealth: Payer: Self-pay | Admitting: Family

## 2011-03-06 NOTE — Telephone Encounter (Signed)
OK to change manufacturers 

## 2011-03-07 NOTE — Telephone Encounter (Signed)
Call placed to [facility] at [phone], spoke with the pharmacist Reuel Boom. He was informed of manufacturer change okay

## 2011-03-07 NOTE — Telephone Encounter (Signed)
Call placed to pharmacy, spoke with Reuel Boom okay to change manufacturer per [facility] ok

## 2011-04-08 NOTE — Procedures (Signed)
LOWER EXTREMITY ARTERIAL DUPLEX   INDICATION:  Right lower extremity claudication, history of right  popliteal artery thrombus.   HISTORY:  Diabetes:  Yes  Cardiac:  MI in 2001  Hypertension:  Yes  Smoking:  Yes  Previous Surgery:  Right superficial femoral artery thrombectomy and  popliteal artery DPA on 04/27/2008.   SINGLE LEVEL ARTERIAL EXAM                          RIGHT                LEFT  Brachial:               142                  134  Anterior tibial:        131                  129  Posterior tibial:       129                  141  Peroneal:  Ankle/Brachial Index:                Ankle brachial index on 07/04/2008:       0.92                                  1.0   0.99   1.04   LOWER EXTREMITY ARTERIAL DUPLEX EXAM   DUPLEX:  Biphasic Doppler waveforms noted throughout the bilateral  femoral-popliteal arterial systems with an elevated velocity of 485  cm/sec. noted in the right mid superficial femoral artery.   IMPRESSION:  1. Patent bilateral femoral-popliteal arterial systems with an      elevated velocity, as described above.  2. Biphasic Doppler waveforms and bilateral ankle-brachial indices      suggest mildly decreased perfusion of the right lower extremity and      normal perfusion of the left lower extremity.  Mild decrease of the      right ankle-brachial index when compared to the previous exam with      the left ankle-brachial index remaining stable.        ___________________________________________  Quita Skye. Hart Rochester, M.D.   CH/MEDQ  D:  12/03/2010  T:  12/03/2010  Job:  [id number]

## 2011-04-08 NOTE — Op Note (Signed)
Fernando Combs, KENT NO.:  [id number]   MEDICAL RECORD NO.:  [id number]          PATIENT TYPE:  INP   LOCATION:  3312                         FACILITY:  [facility]   PHYSICIAN:  Quita Skye. Hart Rochester, M.D.  DATE OF BIRTH:  1946/06/10   DATE OF PROCEDURE:  04/26/2008  DATE OF DISCHARGE:                               OPERATIVE REPORT   PREOPERATIVE DIAGNOSES:  Ischemic right lower extremity, rule out  embolic disease versus femoropopliteal occlusive disease.   PROCEDURES:  1. Abdominal aortogram via left common femoral approach.  2. Bilateral lower extremity runoff via left common femoral approach.  3. Selective catheterization right external iliac artery with right      lower extremity angiogram.   SURGEON:  Quita Skye. Hart Rochester, MD   ANESTHESIA:  Local Xylocaine contrast 135 mL.   COMPLICATIONS:  None.   DESCRIPTION OF PROCEDURE:  The patient was taken to the [facility]  Peripheral [facility], placed in supine position; at which time,  both groins were prepped with Betadine solution, and draped in routine  sterile manner.  After infiltration of 1% Xylocaine, left common femoral  artery was entered percutaneously.  Guidewire passed into the suprarenal  aorta under fluoroscopic guidance.  A 5-French sheath and dilator were  passed over the guidewire, dilator removed, and a standard pigtail  catheter positioned in the suprarenal aorta.  Flush abdominal aortogram  was performed injecting 20 mL of contrast at 20 mL per second.  This  revealed the aorta to be widely patent, although small in caliber.  There was a mild stenosis of the proximal 1 cm of the right renal artery  approximating 40%-50%, left renal artery was widely patent.  Infrarenal  aorta was relatively free of disease.  Both common internal and external  iliac arteries were patent.  There was an ulcerative plaque in the right  common iliac artery with no apparent stenosis noted.  Catheter was  withdrawn  into the terminal aorta and bilateral lower extremity runoff  performed injecting 88 mL of contrast at 8 mL per second.  This revealed  the right side to be relatively free of disease with a widely patent  right common femoral, superficial, and profunda femoris arteries with  very minimal plaque in the right superficial femoral.  The right  popliteal artery was patent with three-vessel runoff with some mild  occlusive disease in mid-portion of the left posterior tibial artery.  The right side had widely patent common femoral and profunda femoris  artery.  There was very slow sluggish flow in the superficial femoral  artery, which went to the mid thigh where there was a meniscus sign and  contrast hung up at this point.  There was reconstitution of the  superficial femoral artery about 10 cm above the knee joint with very  mild occlusive disease noted.  Popliteal artery was patent across the  knee joint and there was one-vessel runoff to the anterior tibial  artery, which was a normal-appearing vessel.  There was an abrupt cutoff  at the origin of tibioperoneal trunk, peroneal artery filled distally  via  collaterals, posterior tibial artery did not fill.  Additional views  were then obtained of the right lower extremity after removing the  pigtail catheter, and exchanging it for an IMA catheter and selectively  cannulating the right external iliac artery.  Additional run was  performed using the cine mode and this confirmed the above findings.  Having tolerated the procedure well, sheath was removed, adequate  compression applied.  No complications ensued.   FINDINGS:  1. Mild right renal artery stenosis approximating 40%-50%.  2. Ulcerative plaque right common iliac artery with no stenosis.  3. Abrupt cutoff mid right superficial femoral artery with apparent      embolic occlusion of right tibioperoneal trunk.  4. Minimal occlusive disease in the left lower extremity.      Quita Skye  Hart Rochester, M.D.  Electronically Signed     JDL/MEDQ  D:  04/26/2008  T:  04/27/2008  Job:  [id number]

## 2011-04-08 NOTE — H&P (Signed)
NAME:  Fernando Combs, MIN NO.:  [id number]   MEDICAL RECORD NO.:  [id number]          PATIENT TYPE:  INP   LOCATION:  2012                         FACILITY:  [facility]   PHYSICIAN:  Everardo Beals. Juanda Chance, MD, FACCDATE OF BIRTH:  March 12, 1946   DATE OF ADMISSION:  04/26/2008  DATE OF DISCHARGE:                              HISTORY & PHYSICAL   REFERRING PHYSICIAN:  Quita Skye. Hart Rochester, MD   PRIMARY CARE PHYSICIAN:  Barbette Hair. Artist Pais, DO   CARDIOLOGIST:  Learta Codding, MD, [facility]   CLINICAL HISTORY:  Mr. Mczeal is 65 years old and is an employee at  [employer].  He developed a sudden onset of severe pain in his right leg and  was admitted and found to have total occlusion of the superficial  femoral artery on the right side, which was operated and opened by Dr.  Hart Rochester.  He has done well postoperatively with no cardiac symptoms.   He has had previous coronary bypass graft surgery in 2001 and his  ejection fraction was 55% at that time.  He has had no recent chest  pain, shortness of breath, or palpitations.   PAST MEDICAL HISTORY:  1. Hypertension.  2. Hyperlipidemia.  3. Diabetes.  4. He has a history of tobacco use.  5. He also had a previous history of rectal bleeding in 2006 related      to possible ischemic colitis.   HOME MEDICATIONS:  1. Actos 50 mg daily.  2. Norvasc 10 mg daily.  3. Benazepril 10 mg daily.  4. Lipitor 40 mg daily.  5. Metoprolol 50 mg daily.  6. Terazosin.   He is currently on Plavix and aspirin.   SOCIAL HISTORY:  He lives in [place] with his wife.  He works at  [employer].  He does smoke.   FAMILY HISTORY:  Unknown.  His father died of alcohol abuse.   REVIEW OF SYSTEMS:  Negative except for the pain in his right leg.   PHYSICAL EXAMINATION:  VITAL SIGNS:  Blood pressure was 153/63 and pulse  77 and regular.  NECK:  There was no venous distension.  The carotid pulses were full and  there were no bruits.  CHEST:  Clear without  rales or rhonchi.  HEART:  Rhythm was regular.  No murmurs or gallops.  ABDOMEN:  Soft with normal bowel sounds.  There was no  hepatosplenomegaly.  EXTREMITIES:  The patient had dressings from his recent surgery on his  right leg.  The pedal pulses were good in both feet.  MUSCULOSKELETAL:  No deformities.  SKIN:  Warm and dry.  NEUROLOGIC:  No focal neurological signs.   IMPRESSION:  1. Acute right superficial femoral artery occlusion, now postop      following surgery by Dr. Hart Rochester.  2. Coronary artery disease status post coronary bypass graft surgery      2001.  3. Ejection fraction of 55% in 2001.  4. Hypertension.  5. Hyperlipidemia.  6. Diabetes.   RECOMMENDATIONS:  I agree with evaluation with echocardiography and  treat him with Plavix.  Dr. Hart Rochester indicated  that his findings at  surgery were not particularly suggestive of systemic embolus.  If his  echocardiogram does not show major LV function, I think treatment with  aspirin and Plavix is appropriate rather than Coumadin.  There is no  history of atrial fibrillation.  We will continue his other cardiac  medicines and we will arrange cardiac followup at the time of discharge.      Bruce Elvera Lennox Juanda Chance, MD, [facility]  Electronically Signed     BRB/MEDQ  D:  04/27/2008  T:  04/28/2008  Job:  [id number]

## 2011-04-08 NOTE — Assessment & Plan Note (Signed)
[facility]                            CARDIOLOGY OFFICE NOTE   NAME:Badalamenti, JOHNATAN BASKETTE                      MRN:          [id number]  DATE:05/25/2008                            DOB:          Jun 19, 1946    HISTORY:  Henson Fraticelli came in today because of pain in his right  groin.  He had come in for a catheterization for chest pain and was  found to have nonobstructive disease and was closed with StarClose.  He  suddenly developed pain in his right groin, which has persisted.  He saw  Dr. Lina Sar for GI problems 2 days ago and complained of this and we  arranged for him to have a duplex study done today.  This was done and  showed that the artery was patent and there was no evidence of  pseudoaneurysm or hematoma.  The clip was probably visible.   PHYSICAL EXAMINATION:  VITAL SIGNS:  On examination, the blood pressure  is 160/96 and pulse 72 and regular.  CARDIAC:  The right femoral artery was soft.  There was a small knot at  the artery site, but I could hear no bruit and did not feel any widened  pulsations.  I did not feel any swelling or hematoma, although he said  that to him it felt like it was swollen medial to the insertion site.  All the pain was at the insertion site and just medial insertion site in  the right groin.  None of the pain went down the leg.   IMPRESSION:  Right groin pain following StarClose closure following  cardiac catheterization.   RECOMMENDATIONS:  The pain seems to be reproducible by bending the hip,  and I suspect it is related to mechanical pinching of the femoral artery  with the associated nerves in this region.  I would still expect this to  resolve with more time, and I reassured Mr. Swider that it was likely  to resolve with time.  He is taking Vicodin for his back pain which also  helps the groin pain.  He has an appointment to see me in 6 weeks.  If  he is absolutely no better and we are not making any  headway, we might  consider surgical removal of the StarClose, although I have never done  that before and would have to obtain some more information on that prior  to considering that.     Bruce Elvera Lennox Juanda Chance, MD, [facility]  Electronically Signed    BRB/MedQ  DD: 05/25/2008  DT: 05/26/2008  Job #: [id number]

## 2011-04-08 NOTE — Procedures (Signed)
CAROTID DUPLEX EXAM   INDICATION:  Carotid stenosis.   HISTORY:  Diabetes:  Yes.  Cardiac:  MI in 2001.  Hypertension:  Yes.  Smoking:  Yes.  Previous Surgery:  Right SFA thrombectomy and popliteal artery DPA,  04/27/2008 by Dr. Hart Rochester.  CV History:  No.  Amaurosis Fugax No, Paresthesias No, Hemiparesis No.                                       RIGHT             LEFT  Brachial systolic pressure:         158               155  Brachial Doppler waveforms:         Normal            Normal  Vertebral direction of flow:        Antegrade         Antegrade  DUPLEX VELOCITIES (cm/sec)  CCA peak systolic                   72                55  ECA peak systolic                   126               107  ICA peak systolic                   148               136  ICA end diastolic                   41                43  PLAQUE MORPHOLOGY:                  Heterogenous      Mixed  PLAQUE AMOUNT:                      Minimal/moderate  Minimal/moderate  PLAQUE LOCATION:                    Bifurcation, CCA  Bifurcation, CCA   IMPRESSION:  1. Bilateral internal carotid artery velocities suggest 40% to 59%      stenosis.  2. Antegrade vertebral arteries bilaterally.    ___________________________________________  Janetta Hora Fields, MD   EM/MEDQ  D:  11/28/2010  T:  11/28/2010  Job:  [id number]

## 2011-04-08 NOTE — Op Note (Signed)
NAMEAERIC, BURNHAM NO.:  [id number]   MEDICAL RECORD NO.:  [id number]          PATIENT TYPE:  INP   LOCATION:  3312                         FACILITY:  [facility]   PHYSICIAN:  Quita Skye. Hart Rochester, M.D.  DATE OF BIRTH:  1945-12-18   DATE OF PROCEDURE:  04/26/2008  DATE OF DISCHARGE:                               OPERATIVE REPORT   PREOPERATIVE DIAGNOSIS:  Ischemic right leg secondary to embolus versus  occlusive disease, right superficial femoral artery.   POSTOPERATIVE DIAGNOSIS:  Ischemic right leg secondary to acute  occlusion of the right superficial femoral artery with probable  thrombosis of underlying occlusive disease.   OPERATION:  Transpopliteal thromboembolectomy of right superficial  femoral artery with Dacron patch angioplasty of right popliteal artery  and exploration of tibial vessels.   SURGEON:  Shiro Ellerman. Hart Rochester, MD.   FIRST ASSISTANT:  Nurse.   ANESTHESIA:  General endotracheal.   PROCEDURE:  The patient was taken to the operating room and placed in  the supine position at which time satisfactory general endotracheal  anesthesia was administered.  The right leg was prepped with Betadine  scrub and solution and draped in a routine sterile manner.  An incision  was made medially below the knee through the previous scar, where the  vein had been harvested for coronary artery bypass grafting up to the  distal thigh.  Popliteal artery was exposed below the knee and dissected  free down to and including the tibioperoneal trunk and anterior tibial  arteries.  The anterior tibial vein was retracted for exposure.  There  was some diffuse calcification involving the tibioperoneal trunk.  The  popliteal artery itself was thickened with some mild calcification, but  patent on the angiogram.  The patient was then heparinized.  Longitudinal opening made in the popliteal artery extended down to about  1 cm proximal to the origin of the anterior tibial artery.   The vessel  was quite thickened.  A 4-Fogarty catheter was passed proximally and  would traverse the superficial femoral artery up into the iliac system,  and upon return, a core of embolic debris with a tail of thrombus was  retrieved.  It was difficult to tell whether this was excellent embolus  or just thrombosis of an area of occlusive disease in the mid thigh.  There was definitely some mild occlusive disease in the mid thigh, where  the thrombus was noted.  After 2 or 3 passes with the Fogarty, excellent  inflow was present, and no further debris could be removed.  Distal  vessels were then explored.  The tibioperoneal trunk was totally  occluded chronically.  The origin of the anterior tibial artery was  explored with a right-angle clamp and was patent as on the angiogram,  and there was no thrombus in this vessel.  A Dacron patch was then sewn  into place in the popliteal artery with 6-0 Prolene.  The clamps were  relieved, and there was excellent Doppler flow and a  strongly palpable dorsalis pedis pulse in the ankle.  No protamine was  given.  The wound  was irrigated with saline and closed in layers with  Vicryl in a subcuticular fashion with Steri-Strips and sterile dressing  applied.  The patient taken to the recovery room in satisfactory  condition.      Quita Skye Hart Rochester, M.D.  Electronically Signed     JDL/MEDQ  D:  04/26/2008  T:  04/27/2008  Job:  [id number]

## 2011-04-08 NOTE — Procedures (Signed)
LOWER EXTREMITY ARTERIAL EVALUATION-SINGLE LEVEL   INDICATION:  Follow-up evaluation of thrombectomy of the right leg.  Patient reports right leg claudication.   HISTORY:  Diabetes:  No.  Cardiac:  Coronary artery bypass graft.  Hypertension:  Yes.  Smoking:  No.  Previous Surgery:  Thrombectomy of right superficial femoral artery and  popliteal artery with Dacron patch angioplasty on 04/27/08 by Dr.  Hart Rochester.   RESTING SYSTOLIC PRESSURES: (ABI)                          RIGHT                LEFT  Brachial:               148                  144  Anterior tibial:        170 (>1.0)           140  Posterior tibial:       168                  150 (>1.0)  Peroneal:  DOPPLER WAVEFORM ANALYSIS:  Anterior tibial:        Triphasic            Triphasic  Posterior tibial:       Biphasic             Triphasic  Peroneal:   PREVIOUS ABI'S:  Date: 04/26/08 (preop)  RIGHT:  0.57  LEFT:  >1.0   IMPRESSION:  Ankle brachial indices are within normal limits and suggest  no significant lower extremity arterial occlusive disease bilaterally.   ___________________________________________  Quita Skye Hart Rochester, M.D.   MC/MEDQ  D:  05/16/2008  T:  05/16/2008  Job:  [id number]

## 2011-04-08 NOTE — Discharge Summary (Signed)
Fernando Combs, BRACEWELL NO.:  [id number]   MEDICAL RECORD NO.:  [id number]          PATIENT TYPE:  INP   LOCATION:  2012                         FACILITY:  [facility]   PHYSICIAN:  Quita Skye. Hart Rochester, M.D.  DATE OF BIRTH:  April 28, 1946   DATE OF ADMISSION:  04/26/2008  DATE OF DISCHARGE:  04/28/2008                               DISCHARGE SUMMARY   DISCHARGE DIAGNOSES:  1. Occlusion of the right superficial artery.  2. Hypertension.  3. Hyperlipidemia.  4. Diabetes.  5. History of tobacco use   PROCEDURE PERFORMED:  Thrombectomy of the popliteal artery by Dr.  Delton See, April 26, 2008.   COMPLICATIONS:  None.   DISCHARGE MEDICATIONS:  1. Actos 15 mg p.o. daily.  2. Amlodipine 10 mg p.o. daily.  3. Benazepril 10 mg p.o. daily.  4. Lipitor 40 mg p.o. daily.  5. Metoprolol tartrate 50 mg p.o. b.i.d.  6. Trazodone 5 mg p.o. daily.  7. Percocet 5/325 one p.o. q.4 h. p.r.n. pain.  8. Plavix 75 mg p.o. daily.  9. Aspirin 81 mg p.o. daily.   DISPOSITION:  He is being discharged home in stable condition.  He is  instructed to clean the wound with soap and warm water.  He is  instructed to increase his activity slowly.  He may walk with assistance  and walk upsteps.  He may shower.  He is not to drive for 3 weeks.  He  should not work for approximately 2 weeks.  He is instructed to observe  his wound for drainage, increasing swelling, pain, redness, or fever  greater than 101.2.  He is given follow-up appointment with Dr. Hart Rochester  in 2 weeks for ABIs.  The office will arrange.  He is also to return to  see Dr. Juanda Chance on May 25, 2008, at 3:15 p.m.   BRIEF IDENTIFYING STATEMENT:  For complete details, please refer to the  typed history and physical.  Briefly, this very pleasant gentleman was  brought in through the emergency room with a several-day history of  claudication of his right calf.  An abdominal aortogram demonstrated  diminished flow to his right lower extremities.   Dr. Hart Rochester felt that he  should be admitted and a revascularization procedure performed.   HOSPITAL COURSE:  He was admitted to the emergency room and underwent  thrombectomy of his right lower leg through his popliteal artery.  Blood  flow was re-established.  Postoperatively, he had a 3+ DP pulse  palpable.  He tolerated the procedure and was returned to the  postanesthesia care unit extubated.  Following stabilization, he was  transferred to a bed on a surgical step-down unit.  He was observed  overnight.  Prior to his discharge, we asked cardiology to evaluate him  and to appreciate their assistance.  He did undergo an echocardiogram  and ABIs postoperatively.   Postoperatively, he was walking and improving with physical therapy.  His wound was healing well.  He was felt stable and was discharged home  in stable condition on April 28, 2008.   CONDITION ON DISCHARGE:  Stable and improving.  Wilmon Arms, PA      Quita Skye Hart Rochester, M.D.  Electronically Signed    KEL/MEDQ  D:  04/28/2008  T:  04/28/2008  Job:  [id number]

## 2011-04-08 NOTE — Procedures (Signed)
BYPASS GRAFT EVALUATION   INDICATION:  Followup, right revascularization, minimal claudication on  right.   HISTORY:  Diabetes:  Yes.  Cardiac:  CABG.  Hypertension:  Yes.  Smoking:  No.  Previous Surgery:  Thrombectomy of right superficial femoral artery and  popliteal artery with DPA on 04/27/08 by Dr. Hart Rochester.   SINGLE LEVEL ARTERIAL EXAM                               RIGHT              LEFT  Brachial:                    163                162  Anterior tibial:             163                170  Posterior tibial:            156                165  Peroneal:  Ankle/brachial index:        1.0                1.04   PREVIOUS ABI:  Date: 05/16/08  RIGHT:  1.15  LEFT:  1.01   LOWER EXTREMITY BYPASS GRAFT DUPLEX EXAM:   DUPLEX:  Doppler arterial waveforms appear borderline triphasic/biphasic  proximal to, within, and distal to bypass graft.   IMPRESSION:  1. Bilateral ankle brachial indices appear within normal limits and      stable from previous study.  2. Patent right superficial femoral artery and popliteal artery with      no evidence of focal stenosis.  3. Evidence of minimal intermittent plaque noted.  4. Evidence of hematoma-like structure without flow near medial aspect      of distal knee measuring 1.61 cm X 2.87 cm.      ___________________________________________  Quita Skye. Hart Rochester, M.D.   AS/MEDQ  D:  07/04/2008  T:  07/04/2008  Job:  [id number]   cc:   Quita Skye. Hart Rochester, M.D.

## 2011-04-08 NOTE — Assessment & Plan Note (Signed)
OFFICE VISIT   RAYEN, DAFOE  DOB:  11/24/1946                                       07/04/2008  ZOXWR#:[id number]   The patient returns 2 months post trans-popliteal embolectomy of the  right superficial femoral artery for 48-hour history of ischemia of the  right leg.  He had complete resolution of his symptoms and excellent  return of pulses and pressures in the right leg.  This has remained at  greater than 100% with an excellent popliteal, posterior tibial and  dorsalis pedis pulse palpable the right leg.  He has no claudication  symptoms and no complaints.  Duplex scan of the right superficial  femoral artery today looking for atherosclerotic lesion was unremarkable  with a widely patent artery and no evidence of increased velocity.  He  is taking aspirin and Plavix.  I do not think this was an embolic event  so we did not place him on Coumadin.   In general I think he is getting along well.  His pressure today is  142/88, heart rate 74.  Physical exam of the right leg as noted.  He  will discontinue the Plavix and continue taking daily aspirin.  He will  return to see [place] on a p.r.n. basis.   Quita Skye Hart Rochester, M.D.  Electronically Signed   JDL/MEDQ  D:  07/04/2008  T:  07/05/2008  Job:  [id number]

## 2011-04-08 NOTE — Procedures (Signed)
NAME:  Fernando Combs, Fernando Combs NO.:  [id number]   MEDICAL RECORD NO.:  [id number]          PATIENT TYPE:  OUT   LOCATION:  SLEEP CENTER                 FACILITY:  [facility]   PHYSICIAN:  Barbaraann Share, MD,FCCPDATE OF BIRTH:  06-[birth year]   DATE OF STUDY:  06/22/2007                            NOCTURNAL POLYSOMNOGRAM   REFERRING PHYSICIAN:   INDICATION FOR STUDY:  Hypersomnia with sleep apnea.   EPWORTH SLEEPINESS SCORE:  15.   MEDICATIONS:   SLEEP ARCHITECTURE:  Patient had a total sleep time of 389 minutes with  no slow-wave sleep and only 66 minutes of REM.  Sleep onset latency was  normal and REM onset was normal as well.  Sleep efficiency was mildly  decreased at 88%.   RESPIRATORY DATA:  The patient was found to have 17 hypopneas and no  apneas for an apnea-hypopnea index of 3 per hour.  The events were worse  in the supine position and there was moderate to loud snoring noted  throughout.   OXYGEN DATA:  There was transient O2 desaturation as well as low as 86%  with the patient's obstructive events.   CARDIAC DATA:  Very rare PVC's and PAC's were noted, but no clinically  significant arrhythmias.   MOVEMENT-PARASOMNIA:  The patient was found to have 14 leg jerks with  only 1.7 per hour resulting in arousal or awakening.   IMPRESSIONS-RECOMMENDATIONS:  1. Small numbers of obstructive events which do not meet the apnea-      hypopnea index criteria for the obstructive sleep apnea syndrome.      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  This is a pre-sleep apnea condition      that usually responds quite well to weight loss and conservative      therapy.  Patient's events were      worse in the supine position and, therefore, non-supine sleep may      also help him with treatment.  2. Rare PAC's and PVC's but no significant  arrhythmia.      Barbaraann Share, MD,FCCP  Diplomate, American Board of Sleep  Medicine  Electronically Signed     KMC/MEDQ  D:  06/29/2007 17:03:26  T:  06/30/2007 08:27:55  Job:  [id number]

## 2011-04-08 NOTE — Consult Note (Signed)
NAMEDHRUVA, ORNDOFF NO.:  [id number]   MEDICAL RECORD NO.:  [id number]          PATIENT TYPE:  INP   LOCATION:  3312                         FACILITY:  [facility]   PHYSICIAN:  Quita Skye. Hart Rochester, M.D.  DATE OF BIRTH:  10/30/1946   DATE OF CONSULTATION:  04/26/2008  DATE OF DISCHARGE:                                 CONSULTATION   REFERRING PHYSICIAN:  Shelda Jakes, MD   CHIEF COMPLAINT:  Right calf claudication.   HISTORY OF PRESENT ILLNESS:  This 65 year old male patient had an acute  onset of right calf claudication which began 2 days ago.  When he was at  work, he noted that his right calf would become uncomfortable walking 25-  50 feet which had not been present in the past.  He had been able to  walk several miles per day with no symptoms.  He had no rest pain or  numbness in the right foot, except occasionally at night with some  tingling.  He had no history of arrhythmias.  This morning, he developed  anterior thigh discomfort and came to the [facility]  where arterial and venous Doppler studies were performed.  The venous  Doppler was negative with no evidence of superficial or deep venous  thrombosis or phlebitis.  He had plaque in the right common femoral  artery with occlusion of the right superficial femoral artery with an  ABI of 0.57 on the right and greater than 1 on the left.  He is being  evaluated for acute claudication-type symptoms.   PAST MEDICAL HISTORY:  1. Coronary artery disease.  2. Diabetes mellitus.  3. Hypertension.  4. Hyperlipidemia.  5. Negative for stroke or emphysema.   PAST SURGICAL HISTORY:  Coronary artery bypass grafting in 2000.   SOCIAL HISTORY:  He is positive for tobacco abuse, having smoked roughly  a pack of cigarettes per day most of his life beginning when he was a  teenager, currently smokes 4-5 cigarettes per day.  Does not use  alcohol.   FAMILY HISTORY:  Unremarkable.   ALLERGIES:  None  known.   MEDICATIONS:  1. Actos 15 mg a day.  2. Amlodipine 10 mg a day.  3. Benazepril 10 mg a day.  4. Lipitor 40 mg a day.  5. Metoprolol 50 mg a day.  6. Terazosin 5 mg per day.   PHYSICAL EXAMINATION:  VITAL SIGNS:  Temperature is 98.7, blood pressure  150/87, heart rate is 80, respirations are 14.  GENERAL:  He is a healthy-appearing middle-aged male in no apparent  distress, alert, and oriented x3.  NECK:  Supple, 3+ carotid pulses are palpable.  No bruits are audible.  NEUROLOGIC:  Normal.  No palpable adenopathy in the neck.  CHEST:  Clear to auscultation.  CARDIOVASCULAR:  Reveals regular rhythm.  No murmurs.  Upper extremity  pulses are 3+ bilaterally.  ABDOMEN:  Soft, nontender with no palpable masses.  EXTREMITIES:  Reveals 3+ femoral, popliteal, and posterior tibial pulses  palpable on the left.  Right leg has 3+ femoral pulse.  No popliteal or  distal pulses.  Both feet are adequately perfused with no tenderness or  decreased sensation.  Right foot is possibly slightly paler than the  left.  There is no calf tenderness.  He has some discomfort when  palpating along the saphenous vein near the saphenofemoral junction.  There is no swelling, erythema, or palpable cord present.   IMPRESSION:  1. Acute claudication symptoms x48 hours, right calf, secondary to      superficial femoral occlusion, rule out embolic versus plaque.  2. Right anterior thigh pain, etiology unknown.  3. Coronary artery disease, previous myocardial infarction, coronary      artery bypass grafting in 2000, now stable.  4. Non-insulin-dependent diabetes mellitus.  5. Hyperlipidemia.   PLAN:  To obtain abdominal aortogram today with runoff to further assess  his arterial system.  Risks and benefits have been thoroughly discussed  with the patient and he would like to proceed.      Quita Skye Hart Rochester, M.D.  Electronically Signed     JDL/MEDQ  D:  04/26/2008  T:  04/27/2008  Job:  [id number]

## 2011-04-08 NOTE — Assessment & Plan Note (Signed)
OFFICE VISIT   Fernando Combs, Fernando Combs  DOB:  August 18, 1946                                       11/28/2010  EAVWU#:[id number]   HISTORY OF PRESENT ILLNESS:  The patient is a 65 year old Caucasian male  who presents today for continued follow-up of known carotid stenosis.  The patient denies all symptoms of TIA and CVA, including aphasia,  paralysis, facial droop, and amaurosis fugax.   Past medical history is significant for coronary artery disease, status  post CABG, hypertension, hyperlipidemia, as well as previous thrombosis  of the right SFA in 2009.  The patient has been followed routinely in  the office with carotid duplex and evaluation of the bilateral lower  extremities.   On presentation today, the patient states he has had increasing right  lower extremity calf pain over the past few weeks.  He describes this as  calf burning with prolonged walking or standing with resolution of  symptoms after resting for several minutes.  The discomfort is  intermittent, and there are no other aggravating or relieving factors.  The patient presented to his primary care physician who said he noted  that the pulses in the patient's foot on the right were decreased when  compared to the left.  The patient denies new wounds, rest pain,  numbness, tingling or prolonged pain in the right lower extremity.  He  denies all symptoms in the left lower extremity.   PAST MEDICAL HISTORY:  1. Coronary artery disease, status post CABG 2001.  2. Thrombus to the right SFA, status post thromboembolectomy of the      right SFA in June 2000 by Dr. Hart Rochester.  3. Diabetes.  4. Hypertension.  5. Hyperlipidemia.  6. Carotid stenosis.  7. Tobacco abuse.   FAMILY HISTORY:  Alcohol abuse in the father.   SOCIAL HISTORY:  Ongoing tobacco abuse.  The patient smokes 1 pack a day  and has so for 40 years.  He denies alcohol use.  He continues to work  and is married.   MEDICATIONS:  The  patient did not bring a list of medications for  review.  He states he is taking aspirin daily.   REVIEW OF SYSTEMS:  A complete review of systems is negative with the  exception of symptoms, as noted in the HPI.   PHYSICAL EXAMINATION:  The patient is a healthy-appearing man in no  acute distress and well-nourished.  HEENT:  PERRL.  EOMI.  Conjunctivae  are normal.  Lungs:  Clear to auscultation.  Cardiovascular:  Regular  rate and rhythm.  No bruits are noted.  Abdomen:  Soft, nontender,  nondistended with active bowel sounds.  Neuro:  No focal weakness or  paresthesias.  Skin:  No ulcers or rashes were noted.  Musculoskeletal:  Bilateral femoral pulses are 2+.  The left lower extremity has 2+ pedal  pulses.  The right lower extremity has a palpable dorsalis pedis and a  Doppler signal in the popliteal artery; however, there is no Doppler  signal or palpable pulse in the posterior tibial.  The bilateral lower  extremities are warm with no discoloration.   STUDIES:  Carotid duplex was performed on 11/28/2010, which revealed  bilateral internal carotid artery velocities which suggest 40% to 59%  stenosis bilaterally.  There is antegrade flow in the vertebrals  bilaterally.   ASSESSMENT:  1. Carotid stenosis.  2. Peripheral arterial disease.   PLAN:  1. The patient will follow up in 1 week with bilateral lower extremity      arterial duplex with ABIs as well as same-day followup with Dr.      Hart Rochester to evaluate the right lower extremity.  2. Follow-up in 1 year with repeat carotid duplex.   Fernando Leisure, PA   Charles E. Fields, MD  Electronically Signed   AY/MEDQ  D:  11/28/2010  T:  11/28/2010  Job:  [id number]

## 2011-04-08 NOTE — Assessment & Plan Note (Signed)
OFFICE VISIT   TARVARES, LANT  DOB:  11/16/1946                                       05/16/2008  EAVWU#:[id number]   The patient returns following a right trans-popliteal thromboembolectomy  of his right superficial femoral artery which was performed on June 3rd.  He had developed severe claudication 48 hours prior and had significant  pain in his right medial thigh.  It was not an apparent embolus down to  the time of surgery, but probably thrombosis of some preexisting disease  in his mid superficial femoral artery, although we have no preexisting  history of claudication.  ABIs have been greater than 1 since his  surgery and he has had an excellent popliteal, dorsalis pedis and  posterior tibial pulse since surgery, which is the case today.  He has  no edema and his incision is healing nicely.  Blood pressure 152/87.  He  is taking aspirin on a daily basis.   He is having no claudication symptoms at this time.  I would like to  perform a duplex scan of his right superficial femoral and popliteal  artery at his return visit to see if there is any evidence of any  stenosis in the mid thigh which might need to be a consideration for PTA  and stenting, since he did thrombosis this vessel.  He will return in 2  months for further followup and is given an okay to return to work  tomorrow.   Quita Skye Hart Rochester, M.D.  Electronically Signed   JDL/MEDQ  D:  05/16/2008  T:  05/17/2008  Job:  [id number]

## 2011-04-08 NOTE — Assessment & Plan Note (Signed)
OFFICE VISIT   Fernando Combs, Fernando Combs  DOB:  [birth year]                                       12/02/2010  ZOXWR#:[id number]   The patient returns today for further evaluation of right lower  extremity claudication symptoms.  He states a few months ago he began  having right calf tightness which occurs between 50 and 100 yards and  requires him to stop and rest for a few minutes for this to resolve.  He  has no history of rest pain, nonhealing ulcers, infection or ulceration.  He also denies any carotid ischemia type symptoms such as hemiparesis,  aphasia, amaurosis fugax, diplopia, blurred vision or syncope.  He has  no symptoms in the left leg.   CHRONIC MEDICAL PROBLEMS:  1. Coronary artery disease.  2. Diabetes mellitus.  3. Hypertension.  4. Hyperlipidemia.  5. History of tobacco abuse.   SOCIAL HISTORY:  The patient is married.  He is retired.  Has smoked a  pack of cigarettes per day for over 40 years.  Today he and his wife  state that they quit smoking this morning, are both going to quit and  they promised that they would never smoke again.  He does not drink  alcohol on a regular basis.   FAMILY HISTORY:  Negative for coronary artery disease, diabetes and  stroke.   REVIEW OF SYSTEMS:  Negative for chest pain, dyspnea on exertion.  No  chronic bronchitis, productive cough.  No wheezing.  All other systems  are negative for complete review of systems.   PHYSICAL EXAMINATION:  Vital signs:  Blood pressure 130/70, heart rate  60, respirations 20.  General:  He is a well-developed, well-nourished  male in no apparent distress, alert and oriented x3.  HEENT:  Normal for  age.  EOMs intact.  Lungs:  Clear to auscultation.  No rhonchi or  wheezing.  Cardiovascular:  Regular rhythm.  No murmurs.  Carotid pulses  3+ with soft bruits.  Abdomen:  Soft, nontender with no masses.  Right  leg has 3+ femoral, absent popliteal and distal pulses.  Left leg has  3+  femoral, popliteal and posterior tibial pulse.   Today I ordered a lower extremity arterial exam which I reviewed and  interpreted.  He has an ABI of 0.92 on the right down from 1.0 in the  past with an area of severe stenosis in the mid SFA with 485 cm per  second velocity.   I believe he does have a stenosis in the mid SFA which will require a  PTA and stenting.  We have arranged for this to be done by Dr. Myra Gianotti  on Tuesday January 24.  This patient had previously presented with  thrombosis of his right popliteal artery and tibial vessels requiring  thromboembolectomy and patch angioplasty of his popliteal artery by me  in June of 2009 but has been asymptomatic in the right leg until his  recent symptoms started.     Quita Skye Hart [place], M.D.  Electronically Signed   JDL/MEDQ  D:  12/02/2010  T:  12/02/2010  Job:  [id number]

## 2011-04-11 NOTE — Op Note (Signed)
[name]  Patient:    Fernando Combs, Fernando Combs                      MRN: [id number] Proc. Date: 08/04/00 Adm. Date:  [id number] Attending:  Mikey Combs CC:         Fernando Combs. Fernando Combs, M.D. [facility]  CVTS Office   Operative Report  PREOPERATIVE DIAGNOSIS:  Class 4 unstable angina with severe three vessel coronary artery disease.  POSTOPERATIVE DIAGNOSIS:  Class 4 unstable angina with severe three vessel coronary artery disease.  OPERATION:  Coronary artery bypass grafting times four (left internal mammary artery to the left anterior descending, saphenous vein graft to diagonal, saphenous vein graft to obtuse marginal, saphenous vein graft to posterior descending).  SURGEON:  Fernando Combs, M.D.  ASSISTANT:  Fernando Combs, P.A.  ANESTHESIA:  General  INDICATIONS:  The patient is a 65 year old white male with a history of coronary artery disease status post angioplasty and stent of his circumflex in the past.  He presented with symptoms of unstable angina and was ruled out for MI.  Cardiac catheterization by Dr. Lewayne Combs demonstrated severe three vessel coronary disease and he was referred for surgical coronary revascularization.  Prior to the operation, I discussed the indications and expected benefits of coronary bypass surgery with the patient.  I reviewed the alternatives, surgical therapy as well.  We discussed the major aspects of the operation including the location of the surgical incisions, the choice of conduit, the use of cardiopulmonary bypass and general anesthesia, and the expected hospital recovery period.  I discussed the specific risks associated with the operation including the risks of MI, CVA, bleeding, infection, and death.  He understood these aspects and the implications of the operation and agreed to proceed with the operation as planned under informed consent.  DESCRIPTION OF PROCEDURE:  The patient was  brought to the operating room and placed supine on the operating room table where general anesthesia was induced under invasive hemodynamic monitoring.  The chest, abdomen and legs were prepped with Betadine and draped as a sterile field.  A median sternotomy was performed and the saphenous vein harvested from the right leg.  It was of average quality.  The mammary artery was harvested and was a small 1.5 mm vessel but with good flow.  Heparin was administered and the sternal retractor was placed.  The ACT was therapeutic.  Pursestrings placed in the ascending aorta and right atrium.  The patient was cannulated and placed on bypass, cooled to 32 degrees.  The coronaries were identified and the posterior descending, diagonal, LAD, and OM were found to be adequate vessels for grafting.  The OM was small and diffusely diseased and would not be a good target for redo operation. The right coronary was very calcified and the graft was placed out into the posterior descending which was less diseased.  The patient was then cooled to 28 degrees and the cardioplegia cannula was placed. As the aortic cross clamp was applied, 500 cc of cold blood cardioplegia was delivered to the aortic root with immediate cardioplegic arrest and septal temperature dropping less than 12 degrees.  Topical iced saline slush was used to augment myocardial preservation and a pericardial installator pad was used to protect the left phrenic nerve.  The distal coronary anastomoses were then performed.  The first distal anastomosis was to the distal right coronary at the posterior descending vessel.  This  was a 1.5 mm vessel proximal 90% stenosis and a reverse saphenous vein was sewn end to side with running 7-0 Prolene.  The second distal anastomosis was to the first diagonal which was a 1.5 mm vessel with proximal 90% stenosis and reverse saphenous vein was sewn end to side with a running 7-0 Prolene.  The third distal  anastomosis was to the totally occluded obtuse marginal which was a small 1.5 mm vessel with a total proximal occlusion.  A reverse saphenous vein was sewn end to side with a running 7-0 Prolene and there was good flow through the graft.  Cardioplegia was redosed. The 4th distal anastomosis was to the distal third of the LAD which was a 1.8 mm vessel with proximal 90% stenosis.  The left internal mammary artery pedicle was brought through an opening created in the left lateral pericardial was brought down on the LAD and sewn end to side with running 8-0 Prolene. There was excellent flow through the anastomosis with immediate rise in septal temperature after release of the pedicle clamp on the mammary artery.  The mammary pedicle was secured to the epicardium and the aortic cross clamp was removed.  The heart was cardioverted back to a regular rhythm.  Using a partial occluding clamp, three proximal vein anastomoses were placed using a 4.0 mm punch and running 6-0 Prolene.  The partial clamp was removed and the vein grafts were perfused.  Each had good flow and hemostasis was documented at the proximal and distal sites.  The patient was rewarmed to 37 degrees.  Temporary pacing wires were applied.  The ventilator was resumed and the patient was weaned from cardiopulmonary bypass without difficulty.  Blood pressure and hemodynamics remained stable.  Protamine was administered and the cannulae were removed.  The mediastinum was irrigated with warm antibiotics irrigation. The leg incision was irrigated and closed in a standard fashion.  The pericardium was loosely reapproximated over the aorta and vein grafts.  Two mediastinal and a left pleural chest tube were placed and brought out through separate incisions.  The sternum was reapproximated with interrupted steel wire.  The pectoralis fascia and subcutaneous layers were closed with running Vicryl.  The skin was closed with a subcuticular and  sterile dressings were  applied.  Total cardiopulmonary bypass time was 110 minutes.  Aortic cross clamp time of 50 minutes. DD:  08/04/00 TD:  08/06/00 Job: [id number]

## 2011-04-11 NOTE — Cardiovascular Report (Signed)
[place]. [facility]  Patient:    Fernando Combs, Fernando Combs                      MRN: [id number] Adm. Date:  [id number] Attending:  Learta Codding CC:         Bernadene Person, M.D.  Lewayne Bunting, M.D.  Kathrine Cords, R.N. [facility]   Cardiac Catheterization  PROCEDURES PERFORMED:  Left heart catheterization with coronary angiography and left ventriculography.  INDICATIONS:  Substernal chest pain and unstable angina.  REFERRING PHYSICIAN:  Adela Lank, M.D.  PRIMARY CARDIOLOGIST:  Dr. Andee Lineman  INTRODUCTION:  The patient is a 65 year old male with known coronary artery disease status post angioplasty and stent placement in the left circumflex coronary artery in 1996.  He was admitted to the hospital with worsening substernal chest pain.  He had no evidence of myocardial infarction.  He is referred for a catheterization.  PROCEDURE:  After informed consent was obtained the patient was taken to the diagnostic catheterization lab in a fasting state.  After the usual preparation and draping, intravenous fentanyl with midazolam given for sedation.  A 6 French left Judkins catheter was inserted percutaneously through the right femoral artery by way of a 7 French introducer sheath and advanced into the left main coronary artery.  Coronary angiography of the left main coronary system was then carried out.  The left Judkins catheter was removed and the right Judkins catheter was inserted by way of the hemostatic sheath and placed in the right coronary artery.  There was immediate damping of the catheter and very brief injections of the right coronary system were carried out.  The right coronary catheter was removed and the pigtail catheter was inserted retrograde across the aortic valve into the left ventricle.  Left ventriculography in the RAO projection with a total of 30 cc of contrast delivered was then carried out.  At this point the catheters were  removed. Hemostasis was assured and the patient returned to his room in good condition.  COMPLICATIONS:  There were no immediate procedure complications.  RESULTS: A. HEMODYNAMICS:  The left ventricular pressure was 171/26.  The aortic pressure was 171/83.  B. LEFT VENTRICULOGRAPHY: Left ventriculography performed in the RAO projection demonstrated moderate concentric left ventricular hypertrophy.  The ejection fraction was qualitatively normal and estimated at 55%.  C. CORONARY ANGIOGRAPHY:  The right coronary artery was a dominant vessel. There was a 50% stenosis proximal to a large artery marginal branch.  The artery marginal branch had an 80% ostial stenosis.  Just distal to the artery marginal branch there was a 60-70% stenosis.  The PDA and PLSA branches were free of obstructive disease.  The left main coronary artery gave rise to a left anterior descending and left circumflex coronary artery.  The left circumflex coronary artery was occluded proximally, proximal to the previously deployed stent.  The first and second obtuse marginal branches were seen filling faintly by way of left-to-left collaterals.  There were also right-to-left collaterals.  The left anterior descending coronary artery had a 90% stenosis just before the second diagonal branch.  This involved the portion of the LAD both before and after the diagonal branch.  The first diagonal branch had no obstructive stenosis but the second diagonal branch had a 90% ostial stenosis.  The distal portion of the LAD had only luminal irregularities.  CONCLUSION:  This study demonstrates preserved left ventricular systolic function with moderately increased left ventricular end  diastolic pressures after intracoronary contrast administration.  It also demonstrates an occluded left circumflex coronary artery with the occlusion proximal to the stent site. There was a 90% high grade LAD stenosis which was fairly long and  eccentric and involved the second diagonal branch.  The second diagonal branch had an 80-90% ostial stenosis.  The right coronary had sequential 50 and 70% stenosis before and after the RV marginal branch which was also severely diseased.  RECOMMENDATION:  Recommend cardiovascular surgery consult for definitive revascularization. DD:  07/31/00 TD:  08/02/00 Job: [id number]

## 2011-04-11 NOTE — Discharge Summary (Signed)
[place]. [facility]  Patient:    Fernando Combs, Fernando Combs                      MRN: [id number] Adm. Date:  [id number] Disc. Date: [id number] Attending:  Mikey Bussing Dictator:   __________ Demetrius Charity.A.C.                           Discharge Summary  ADMISSION DIAGNOSES: 1. Unstable angina. 2. Coronary artery disease.  PAST MEDICAL HISTORY: 1. Coronary artery disease. 2. Status post myocardial infarction in 1996. 3. Status post stent of the circumflex artery in 1996. 4. Hypertension. 5. Benign prostatic hypertrophy.  ADMISSION HISTORY AND PHYSICAL:  This 65 year old white male with known coronary artery disease and inferior MI in 1996, who presented to the emergency room with substernal chest pain he described as burning over the last one to two hours.  He took one sublingual nitroglycerin and the pain resolved.  Since pain resolved, the patient went to work where the pain began again and became more severe.  The patient described the pain as 10/10 radiating to both arms with associated shortness of breath, diaphoresis but no nausea.  The patient took two sublingual nitroglycerins, called EMS and was taken to the emergency room.  On July 31, 2000, the day after admission, [name] underwent a cardiac catheterization showing an occluded left circumflex 30 and 90% occlusion of the left anterior descending coronary artery and the diagonal #2, 70% occlusion of the right coronary artery; however, he did have good left ventricular function.  During this admission the patient also underwent a carotid ultrasound which showed no internal carotid artery stenosis and antegrade vertebral artery flow bilaterally. Ankle brachial indexes were not done at this time secondary to the patient having palpable distal pulses in both bilaterally.  On August 04, 2000, the patient underwent CABG x 4 with general endotracheal anesthesia and cardiopulmonary bypass without  complications.  Postoperative day #1 the patient was found to have hematocrit of 35, creatinine of 1.4 and white blood cell count of 16.  Postoperative day #2 the patient was transferred out of the SICU to the floor and the patient was noted to be in normal sinus rhythm. Postoperative day #3 the patient was found to have hemoglobin and hematocrit of 10.0 and 28.4, creatinine of 1.3 and the patient was still in normal sinus rhythm and doing well at this point.  Anticipated discharge for the patient will be postoperative day #4 which will be August 08, 2000.  DISCHARGE MEDICATIONS: 1. Lopressor 25 mg one half tablet p.o. q.d. 2. Pepcid 20 mg p.o. q.d. 3. Aspirin 81 mg p.o. q.d. 4. Cardura 2 mg p.o. q.d. 5. Darvocet-N 100 one p.o. q.4h. p.r.n. pain. 6. Pravachol 20 mg p.o. q.d. 7. Sublingual nitroglycerin as needed for pain.  DISCHARGE ACTIVITY:  The patient was instructed to undergo no strenuous activity, lifting objects greater than 10 pounds or driving.  DISCHARGE DIET: Low cholesterol, low salt.  WOUND CARE:  The patient was instructed he may shower but no tub baths.  FOLLOW-UP:  The patient was instructed to call [name]. [name], M.D., his cardiologist, for an appointment in two weeks.  At that time he was also instructed to get a chest x-ray and bring that to Kathlee Nations Trigt III, M.D.s, office on his follow-up visit there.  The patient is also instructed to follow up with Dr. Donata Clay  on August 28, 2000, at 9:15 a.m.  DISPOSITION:  The patient will be discharged home tentatively tomorrow on August 08, 2000. DD:  08/07/00 TD:  08/10/00 Job: [id number]

## 2011-04-11 NOTE — Discharge Summary (Signed)
NAME:  Fernando Combs, Fernando Combs NO.:  [id number]   MEDICAL RECORD NO.:  [id number]          PATIENT TYPE:  INP   LOCATION:  3728                         FACILITY:  [facility]   PHYSICIAN:  Fernando Combs, D.O. [facility]   DATE OF BIRTH:  Apr 06, 1946   DATE OF ADMISSION:  07/21/2005  DATE OF DISCHARGE:  07/25/2005                                 DISCHARGE SUMMARY   DISCHARGE DIAGNOSES:  1.  Ischemic colitis.  2.  Hypertension.   HISTORY OF PRESENT ILLNESS:  Patient is a 65 year old white male with a past  medical history of coronary artery disease status post CABG, hyperlipidemia,  hypertension who presented with diarrhea and bright red blood per rectum.  Patient also noted associated lower abdominal pain.  The patient was  admitted for further evaluation.   PAST MEDICAL HISTORY:  1.  Hypertension.  2.  Coronary artery disease status post angioplasty 1995 and catheterization      2001 which revealed stent closure and underwent CABG.  3.  Hyperlipidemia.  4.  BPH.  5.  Tobacco abuse.   COURSE OF HOSPITALIZATION:  #1 - ISCHEMIC COLITIS:  Patient was admitted and  a GI consult was obtained.  Patient was seen by Dr. Arlyce Combs.  Patient had a  CT of the abdomen performed which showed colonic wall thickening and  questionable ischemic colitis versus infectious versus inflammatory colitis.  In addition, patient underwent a colonoscopy which was consistent with  ischemic colitis.  The patient's diet was advanced and patient is currently  tolerating low residue diet with improvement in diarrhea and improvement in  mild abdominal cramping.   #2 - HYPERTENSION:  Patient was maintained off of Altace and Cardura and  hydrochlorothiazide during his hospitalization secondary to risk of  hypotension, worsening ischemic colitis.  Patient was maintained on Toprol  XL 100 mg p.o. daily.  At time of discharge patient's blood pressure is  126/60.  Plan to continue to hold Altace, hydrochlorothiazide, and  Cardura  and patient is to follow up with his primary care physician Fernando Combs next  week for a repeat blood pressure check.   DISCHARGE MEDICATIONS:  1.  Toprol XL 100 mg p.o. daily.  2.  Altace 5 mg p.o. daily, hold.  3.  Hydrochlorothiazide 12.5 mg p.o. daily, hold.  4.  Aspirin 325 mg p.o. daily.  5.  Cardura 10 mg p.o. daily, hold.  6.  Lipitor 10 mg p.o. daily.   FOLLOW-UP:  A follow-up appointment will be scheduled with Dr. Artist Pais for next  week.   DISCHARGE LABORATORIES:  July 23, 2005:  Fecal occult blood positive.  Hemoglobin 14, hematocrit 40.3, white blood cell count 10.1, platelets 214.  BUN 20, creatinine 1.6.      Fernando S. Peggyann Juba, NP      Fernando Combs, D.O. [facility]  Electronically Signed    MSO/MEDQ  D:  07/25/2005  T:  07/25/2005  Job:  [id number]   cc:   Fernando Combs, M.D. [facility]  [address]  [place]  [place] [address]

## 2011-04-11 NOTE — H&P (Signed)
NAME:  Fernando Combs, Fernando Combs NO.:  [id number]   MEDICAL RECORD NO.:  [id number]          PATIENT TYPE:  INP   LOCATION:                               FACILITY:  [facility]   PHYSICIAN:  Thomos Lemons, D.O. [facility]   DATE OF BIRTH:  [birth year]   DATE OF ADMISSION:  07/21/2005  DATE OF DISCHARGE:                                HISTORY & PHYSICAL   CHIEF COMPLAINT:  GI bleed.   HISTORY OF PRESENT ILLNESS:  The patient is a 65 year old white male with  past medical  of coronary artery disease status post CABG, hyperlipidemia,  hypertension, who presents with diarrhea and bright red blood per rectum.  The patient states that since 8 p.m. last evening, the patient has had  numerous loose stools, woke up this morning to have a bowel movement and  noticed a large amount of clots mixed with his stool.  The patient also  states that there was a large amount of blood around his rectal area.  The  patient did note associated lower abdominal pain.  The patient denies any  recent antibiotic intake, no unusual food intake within the last 24 to 48  hours, has no history of internal or external hemorrhoids and has no  previous rectal bleeding in the past.  The patient has also associated  weakness, does not feel dizzy with standing but has been feeling pins and  needles sensation of both his hands this morning.  No chest pain, no  shortness of breath.  The patient denies any hematemesis or nausea and  vomiting.   PAST MEDICAL HISTORY:  1.  Hypertension.  2.  Coronary artery disease.  The patient had angioplasty in 04-27-94 and      catheterization in 2000/04/27 which revealed stent closure and underwent CABG.      The patient has had recent stress testing since that time.  3.  Hyperlipidemia.  4.  Benign prostatic hypertrophy.  5.  Tobacco abuse.  Please note that according to his Apr 27, 2000 cardiac catheterization, his  estimated ejection fraction was 55%.   ALLERGIES:  None.   CURRENT MEDICATIONS:  1.   Toprol XL 100 mg once a day.  2.  Altace 5 mg once a day.  3.  Cardura 10 mg once a day.  4.  Lipitor 10 mg once a day.  5.  Aspirin 325 mg once a day.   SOCIAL HISTORY:  The patient is previously widowed but has remarried.  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  Current habits:  Rarely drinks, positive history of  heavy drinking approximately 5 years ago after his wife passed away, and in  1998-04-27 he did start to smoke.  The patient has decreased his tobacco use  significantly.  The patient is smoking several cigarettes per day.   FAMILY HISTORY:  Current wife has been diagnosed with Hepatitis C.  The  patient states that he has not been tested and does not wish to be tested.  Family history of alcoholism and stroke  as well as heart disease and high  cholesterol.   REVIEW OF SYSTEMS:  As above.  No shortness of breath, no cough, no dysuria,  frequency, urgency and all other systems negative.  He has had recent upper  respiratory infection type symptoms with nasal congestion, fatigue.   PHYSICAL EXAMINATION:  Vitals:  Weight is 213 pounds, temperature is 98.6,  pulse is 91, blood pressure is 99/66.  Orthostatic blood pressures were  checked.  The patient's blood pressure is 140/70 sitting with a pulse rate  of 70 and standing was 120/80 with a pulse rate of 80.  IN GENERAL:  The patient is a well-nourished, well-developed 65 year old  white male in no apparent distress.  HEENT:  Normocephalic and atraumatic.  Pupils are equal and reactive to  light, extraocular motility was intact.  The patient was anicteric.  Conjunctiva were normal.  Tympanic membranes were clear bilaterally.  Oropharynx was free of any lesions.  NECK:  Supple, with no adenopathy, carotid bruit or megaly.  RESPIRATORY EXAM:  Chest was clear to auscultation bilaterally, no rhonchi,  rales or wheezing, normal respiratory effort.  CARDIOVASCULAR:  Regular  rate and rhythm, no significant murmurs, rubs,  gallops appreciated.  ABDOMEN:  Protuberant, mild right lower quadrant and left lower quadrant  tenderness, no hepatosplenomegaly was appreciated or other masses.  EXTREMITIES:  No clubbing, cyanosis or edema.  RECTAL EXAM:  Performed.  No visible external hemorrhoids, normal rectal  tone, grossly heme positive stools, bright red blood on rectal exam.   ASSESSMENT:  1.  GI bleeding associated with diarrhea.  2.  History of coronary artery disease, status post CABG.  3.  Hyperlipidemia.  4.  Hypertension.  5.  History of tobacco abuse.   RECOMMENDATIONS:  Considering the patient's cardiac history and current  symptomatology, we will admit him for inpatient observation.  Possibilities  include ischemic colitis, diverticular bleed versus bleed from an  undiagnosed mass.  We will consult gastroenterology, hold his aspirin.  He  will most likely need colonoscopy during his hospitalization.   With his history of coronary artery disease and pins and needles sensation  of his hands, we will schedule an EKG and cardiac enzymes, continue his  Lipitor for now.  As far as his antihypertensive regimen, we will hold his  Altace and Cardura at this time.      Thomos Lemons, D.O. [facility]  Electronically Signed     RY/MEDQ  D:  07/21/2005  T:  07/21/2005  Job:  [id number]

## 2011-04-22 ENCOUNTER — Ambulatory Visit: Payer: Medicare Other | Admitting: Vascular Surgery

## 2011-04-24 ENCOUNTER — Encounter: Payer: Self-pay | Admitting: Internal Medicine

## 2011-04-28 ENCOUNTER — Ambulatory Visit: Payer: MEDICARE | Admitting: Internal Medicine

## 2011-05-12 ENCOUNTER — Other Ambulatory Visit: Payer: Self-pay | Admitting: Internal Medicine

## 2011-05-12 ENCOUNTER — Other Ambulatory Visit (INDEPENDENT_AMBULATORY_CARE_PROVIDER_SITE_OTHER): Payer: Medicare Other

## 2011-05-12 DIAGNOSIS — E785 Hyperlipidemia, unspecified: Secondary | ICD-10-CM

## 2011-05-12 DIAGNOSIS — I10 Essential (primary) hypertension: Secondary | ICD-10-CM

## 2011-05-12 DIAGNOSIS — R7309 Other abnormal glucose: Secondary | ICD-10-CM

## 2011-05-12 LAB — BASIC METABOLIC PANEL
BUN: 26 mg/dL — ABNORMAL HIGH (ref 6–23)
Chloride: 103 mEq/L (ref 96–112)
Potassium: 4.3 mEq/L (ref 3.5–5.1)

## 2011-05-12 LAB — LIPID PANEL
Cholesterol: 102 mg/dL (ref 0–200)
LDL Cholesterol: 49 mg/dL (ref 0–99)
Triglycerides: 102 mg/dL (ref 0.0–149.0)
VLDL: 20.4 mg/dL (ref 0.0–40.0)

## 2011-05-13 ENCOUNTER — Encounter (INDEPENDENT_AMBULATORY_CARE_PROVIDER_SITE_OTHER): Payer: Medicare Other

## 2011-05-13 ENCOUNTER — Ambulatory Visit (INDEPENDENT_AMBULATORY_CARE_PROVIDER_SITE_OTHER): Payer: Medicare Other | Admitting: Vascular Surgery

## 2011-05-13 DIAGNOSIS — Z48812 Encounter for surgical aftercare following surgery on the circulatory system: Secondary | ICD-10-CM

## 2011-05-13 DIAGNOSIS — I70219 Atherosclerosis of native arteries of extremities with intermittent claudication, unspecified extremity: Secondary | ICD-10-CM

## 2011-05-13 NOTE — Assessment & Plan Note (Signed)
OFFICE VISIT  Fernando Combs, Fernando Combs DOB:  January 05, 1946                                       05/13/2011 ZOXWR#:[id number]  Patient returns today for follow-up regarding the PTA and stenting procedure of his right superficial femoral artery performed by Dr. Myra Gianotti January 24 for severe claudication symptoms in the right leg. Since the procedure, he has been able to walk long distances with no claudication in either leg.  He denies any rest pain or history of nonhealing ulcers or gangrene or infection.  STABLE CHRONIC MEDICAL PROBLEMS: 1. Coronary artery disease. 2. Diabetes mellitus. 3. Hypertension. 4. Hyperlipidemia. 5. A 40-year history of tobacco abuse but stopped 4 months ago.  FAMILY HISTORY:  Negative for coronary artery disease, diabetes, or stroke.  REVIEW OF SYSTEMS:  Denies any chest pain, dyspnea on exertion, PND, orthopnea, lower extremity discomfort.  All systems are negative in a complete review of systems.  PHYSICAL EXAMINATION:  Blood pressure 153/73, heart rate 62, respirations 20.  General:  He is a well-developed and well-nourished male in no apparent distress, alert and oriented x3.  HEENT:  Normal for age.  EOMs intact.  Lungs:  Clear to auscultation.  No rhonchi or wheezing.  Cardiovascular:  Regular rate and rhythm.  No murmurs. Carotid pulses are 3+.  No bruits.  Abdomen:  Soft, nontender with no masses.  He has 3+ femoral, popliteal, and dorsalis pedis pulses bilaterally.  Today I ordered lower extremity ABIs and scan of the right superficial femoral artery stent, which I have reviewed and interpreted.  The ABIs are normal bilaterally.  There is a very mild elevation of velocity in the right superficial femoral artery stent at 212 cm/s.  We will continue to follow him on a regular basis in the vascular lab. If he develops any recurrent symptoms, he will be in touch with [place] in the interim.    Quita Skye Hart Rochester,  M.D. Electronically Signed  JDL/MEDQ  D:  05/13/2011  T:  05/13/2011  Job:  [id number]

## 2011-05-15 NOTE — Procedures (Unsigned)
LOWER EXTREMITY ARTERIAL DUPLEX  INDICATION:  Right lower extremity stent.  HISTORY: Diabetes:  Yes. Cardiac:  No. Hypertension:  Yes. Smoking:  Yes. Previous Surgery:  Right superficial femoral and popliteal artery thrombectomy in 2009, right superficial femoral artery stent on 12/17/2010.  SINGLE LEVEL ARTERIAL EXAM                         RIGHT                LEFT Brachial:               143                  139 Anterior tibial:        154                  126 Posterior tibial:       155                  140 Peroneal: Ankle/Brachial Index:   1.08                 0.98  LOWER EXTREMITY ARTERIAL DUPLEX EXAM  DUPLEX:  Biphasic Doppler waveforms noted throughout the right lower extremity arterial system with a velocity of 212 cm/s noted in the right superficial femoral artery stent.  IMPRESSION: 1. Patent right superficial femoral artery stent with velocity as     described above. 2. The bilateral ankle brachial indices and Doppler waveforms suggest     normal perfusion of the bilateral lower extremities. The bilateral     ankle brachial indices appear stable when compared to the previous     examination on 01/21/2011.   ___________________________________________ Quita Skye. Hart Rochester, M.D.  CH/MEDQ  D:  05/14/2011  T:  05/15/2011  Job:  [id number]

## 2011-05-19 ENCOUNTER — Encounter: Payer: Self-pay | Admitting: Family

## 2011-05-19 ENCOUNTER — Ambulatory Visit: Payer: Medicare Other | Admitting: Internal Medicine

## 2011-05-19 ENCOUNTER — Ambulatory Visit (INDEPENDENT_AMBULATORY_CARE_PROVIDER_SITE_OTHER): Payer: Medicare Other | Admitting: Family

## 2011-05-19 VITALS — BP 138/76 | HR 72 | Temp 98.0°F | Resp 16 | Wt 204.1 lb

## 2011-05-19 DIAGNOSIS — E039 Hypothyroidism, unspecified: Secondary | ICD-10-CM

## 2011-05-19 DIAGNOSIS — I10 Essential (primary) hypertension: Secondary | ICD-10-CM

## 2011-05-19 MED ORDER — AMLODIPINE BESYLATE 10 MG PO TABS: 10.0000 mg | ORAL_TABLET | Freq: Every day | ORAL | Status: DC

## 2011-05-19 MED ORDER — ATORVASTATIN CALCIUM 40 MG PO TABS: 40.0000 mg | ORAL_TABLET | Freq: Every day | ORAL | Status: DC

## 2011-05-19 MED ORDER — CARVEDILOL 25 MG PO TABS: 25.0000 mg | ORAL_TABLET | Freq: Two times a day (BID) | ORAL | Status: DC

## 2011-05-19 MED ORDER — COLCHICINE 0.6 MG PO TABS: 0.6000 mg | ORAL_TABLET | Freq: Every day | ORAL | Status: DC | PRN

## 2011-05-19 NOTE — Patient Instructions (Signed)
Please complete your blood work on the first floor.  Follow up in 3 months.  

## 2011-05-19 NOTE — Progress Notes (Signed)
Subjective:    Patient ID: Fernando Combs, male    DOB: Jan 23, 1946, 65 y.o.   MRN: [id number]  HPI  Patient presents today for followup of hypertension.  Patient has been treated for Chronic HTN for quiet sometime. He is currently on coreg, losartan and amlodipine, and well controlled. No associated S/S related to HTN.   Quality: chronic Modifying factor: meds Duration: Quite sometime Associated S/S: None.  The patient denies the following associated symptoms: Chest pain, dyspnea, blurred vision, headache, or lower extremity edema.  PVD- had vascular procedure in his right leg.  Stent- sees Dr. Hart Rochester.      Review of Systems See HPI  Past Medical History  Diagnosis Date  . History of myocardial infarction   . Hypertension   . Hyperlipidemia   . CAD (coronary artery disease)   . Diabetic peripheral neuropathy   . Benign prostatic hypertrophy   . Diabetes mellitus type II   . History of tobacco abuse   . History of syncope     most likely vasovagal  . Thrombus 04/2008    acute thrombus of his right superficial artery with claudication  . Diabetes, polyneuropathy   . Colitis, ischemic 06/2005     history of    History   Social History  . Marital Status: Married    Spouse Name: N/A    Number of Children: N/A  . Years of Education: N/A   Occupational History  . Not on file.   Social History Main Topics  . Smoking status: Former Smoker    Quit date: 11/24/2010  . Smokeless tobacco: Not on file  . Alcohol Use: No  . Drug Use: Not on file  . Sexually Active: Not on file   Other Topics Concern  . Not on file   Social History Narrative   Occupation:  Works at [employer] - lives with wife (2nd marriage)3 children  Alcohol use-noTobacco Use - Yes.        Past Surgical History  Procedure Date  . Coronary artery bypass graft     times 4 (left interanl mammary artery to the left anterior descending, saphenous vein,graft to diagonal, saphenous vein graft to  obtuse marginal, spahenous vein graft to posterior descending-Surgeon  Kathlee Nations Tright III  . Other surgical history 11/2010    angiogram, right leg stent placement-  -Dr Hart Rochester    Family History  Problem Relation Age of Onset  . Alcohol abuse    . Stroke    . Coronary artery disease    . Hyperlipidemia    . Other Neg Hx     denies family history of blood clots or blood disorder    No Known Allergies  Current Outpatient Prescriptions on File Prior to Visit  Medication Sig Dispense Refill  . aspirin EC 325 MG tablet Take 325 mg by mouth daily.        Marland Kitchen losartan (COZAAR) 25 MG tablet Take 25 mg by mouth daily.        Marland Kitchen DISCONTD: levothyroxine (SYNTHROID, LEVOTHROID) 50 MCG tablet Take 50 mcg by mouth daily.          BP 138/76  Pulse 72  Temp(Src) 98 F (36.7 C) (Oral)  Resp 16  Wt 204 lb 1.3 oz (92.57 kg)       Objective:   Physical Exam  Constitutional: He appears well-developed and well-nourished.  Cardiovascular: Normal rate and regular rhythm.   Pulmonary/Chest: Effort normal and breath sounds normal.  Musculoskeletal: He  exhibits no edema.  Psychiatric: He has a normal mood and affect. His behavior is normal. Judgment and thought content normal.          Assessment & Plan:

## 2011-05-20 ENCOUNTER — Encounter: Payer: Self-pay | Admitting: Family

## 2011-05-20 MED ORDER — LEVOTHYROXINE SODIUM 50 MCG PO TABS: 50.0000 ug | ORAL_TABLET | Freq: Every day | ORAL | Status: DC

## 2011-05-20 NOTE — Assessment & Plan Note (Signed)
BP Readings from Last 3 Encounters:  05/19/11 138/76  12/30/10 108/68  11/04/10 140/70  BP stable, continue current meds.

## 2011-08-18 ENCOUNTER — Ambulatory Visit (INDEPENDENT_AMBULATORY_CARE_PROVIDER_SITE_OTHER): Payer: Medicare Other | Admitting: Internal Medicine

## 2011-08-18 ENCOUNTER — Ambulatory Visit: Payer: Medicare Other | Admitting: Internal Medicine

## 2011-08-18 ENCOUNTER — Encounter: Payer: Self-pay | Admitting: Internal Medicine

## 2011-08-18 DIAGNOSIS — I10 Essential (primary) hypertension: Secondary | ICD-10-CM

## 2011-08-18 DIAGNOSIS — E119 Type 2 diabetes mellitus without complications: Secondary | ICD-10-CM

## 2011-08-18 MED ORDER — AMLODIPINE BESYLATE 10 MG PO TABS: 10.0000 mg | ORAL_TABLET | Freq: Every day | ORAL | Status: DC

## 2011-08-18 NOTE — Patient Instructions (Signed)
Please call the office 1 week before your next office visit re:  Instructions for blood work

## 2011-08-18 NOTE — Assessment & Plan Note (Signed)
Stable.  Monitor A1c Lab Results  Component Value Date   HGBA1C 6.5 05/12/2011

## 2011-08-18 NOTE — Assessment & Plan Note (Signed)
Blood pressure is suboptimally controlled. Patient does not always take carvedilol with food.  Before additional antihypertensives added, I suggest patient take morning dose of carvedilol with breakfast. BP: 144/82 mmHg  Lab Results  Component Value Date   CREATININE 1.5 05/12/2011

## 2011-08-18 NOTE — Progress Notes (Signed)
Subjective:    Patient ID: Fernando Combs, male    DOB: December 07, 1945, 65 y.o.   MRN: [id number]  HPI  65 year old white male with history of coronary artery disease, hypertension, hyperlipidemia, peripheral vascular disease for routine followup. Overall patient has been doing very well. He still works 5 days a week at [employer]. He denies any chest pain or unusual shortness of breath.  Hypertension-despite taking his blood pressure pills he notes systolic blood pressure readings in the 140s and sometimes in the 150s. Elevated blood pressure readings are asymptomatic.  DM II - he is not monitoring his blood sugar. He follows a fairly healthy diet.  No significant weight change Review of Systems    negative for chest pain, negative for shortness of breath Past Medical History  Diagnosis Date  . History of myocardial infarction   . Hypertension   . Hyperlipidemia   . CAD (coronary artery disease)   . Diabetic peripheral neuropathy   . Benign prostatic hypertrophy   . Diabetes mellitus type II   . History of tobacco abuse   . History of syncope     most likely vasovagal  . Thrombus 04/2008    acute thrombus of his right superficial artery with claudication  . Diabetes, polyneuropathy   . Colitis, ischemic 06/2005     history of    History   Social History  . Marital Status: Married    Spouse Name: N/A    Number of Children: N/A  . Years of Education: N/A   Occupational History  . Not on file.   Social History Main Topics  . Smoking status: Former Smoker    Quit date: 11/24/2010  . Smokeless tobacco: Not on file  . Alcohol Use: No  . Drug Use: Not on file  . Sexually Active: Not on file   Other Topics Concern  . Not on file   Social History Narrative   Occupation:  Works at [employer] - lives with wife (2nd marriage)3 children  Alcohol use-noTobacco Use - Yes.        Past Surgical History  Procedure Date  . Coronary artery bypass graft     times 4  (left interanl mammary artery to the left anterior descending, saphenous vein,graft to diagonal, saphenous vein graft to obtuse marginal, spahenous vein graft to posterior descending-Surgeon  Kathlee Nations Tright III  . Other surgical history 11/2010    angiogram, right leg stent placement-  -Dr Hart Rochester    Family History  Problem Relation Age of Onset  . Alcohol abuse    . Stroke    . Coronary artery disease    . Hyperlipidemia    . Other Neg Hx     denies family history of blood clots or blood disorder    No Known Allergies  Current Outpatient Prescriptions on File Prior to Visit  Medication Sig Dispense Refill  . aspirin EC 325 MG tablet Take 325 mg by mouth daily.        Marland Kitchen atorvastatin (LIPITOR) 40 MG tablet Take 1 tablet (40 mg total) by mouth daily.  90 tablet  1  . carvedilol (COREG) 25 MG tablet Take 1 tablet (25 mg total) by mouth 2 (two) times daily with a meal.  180 tablet  1  . colchicine 0.6 MG tablet Take 1 tablet (0.6 mg total) by mouth daily as needed.  30 tablet  0  . levothyroxine (SYNTHROID, LEVOTHROID) 50 MCG tablet Take 1 tablet (50 mcg total) by  mouth daily.  90 tablet  1  . losartan (COZAAR) 25 MG tablet Take 25 mg by mouth daily.          BP 144/82  Pulse 68  Temp(Src) 98.2 F (36.8 C) (Oral)  Ht 5\' 10"  (1.778 m)  Wt 208 lb (94.348 kg)  BMI 29.84 kg/m2    Objective:   Physical Exam   Constitutional: Appears well-developed and well-nourished. No distress.  Neck: Normal range of motion. Neck supple. No thyromegaly present. No carotid bruit Cardiovascular: Normal rate, regular rhythm and normal heart sounds.  Exam reveals no gallop and no friction rub.   No murmur heard. Pulmonary/Chest: Effort normal and breath sounds normal.  No wheezes. No rales.  Abdominal: Soft. Bowel sounds are normal. No mass. There is no tenderness.  Neurological: Alert. No cranial nerve deficit.  Skin: Skin is warm and dry.  Psychiatric: Normal mood and affect. Behavior is  normal.       Assessment & Plan:

## 2011-08-19 ENCOUNTER — Encounter: Payer: Self-pay | Admitting: Internal Medicine

## 2011-08-19 LAB — BASIC METABOLIC PANEL
BUN: 23 mg/dL (ref 6–23)
Chloride: 107 mEq/L (ref 96–112)
Glucose, Bld: 89 mg/dL (ref 70–99)
Potassium: 4.4 mEq/L (ref 3.5–5.1)

## 2011-08-19 LAB — HEMOGLOBIN A1C: Hgb A1c MFr Bld: 6 % (ref 4.6–6.5)

## 2011-08-21 LAB — DIFFERENTIAL
Basophils Relative: 0
Monocytes Absolute: 1
Monocytes Relative: 9
Neutro Abs: 8.7 — ABNORMAL HIGH

## 2011-08-21 LAB — CBC
HCT: 41
HCT: 43.8
Hemoglobin: 14.6
Hemoglobin: 15.1
MCV: 86.2
Platelets: 192
RBC: 4.76
RDW: 14.4
WBC: 11.7 — ABNORMAL HIGH

## 2011-08-21 LAB — BASIC METABOLIC PANEL
BUN: 14
Calcium: 9.5
Chloride: 101
Creatinine, Ser: 1.56 — ABNORMAL HIGH
GFR calc Af Amer: 55 — ABNORMAL LOW
GFR calc non Af Amer: 45 — ABNORMAL LOW
Glucose, Bld: 121 — ABNORMAL HIGH
Potassium: 4.3
Sodium: 137
Sodium: 139

## 2011-08-21 LAB — ABO/RH: ABO/RH(D): AB NEG

## 2011-08-21 LAB — APTT: aPTT: 33

## 2011-11-10 ENCOUNTER — Ambulatory Visit (INDEPENDENT_AMBULATORY_CARE_PROVIDER_SITE_OTHER): Payer: Medicare Other | Admitting: Vascular Surgery

## 2011-11-10 ENCOUNTER — Encounter: Payer: Self-pay | Admitting: Vascular Surgery

## 2011-11-10 ENCOUNTER — Other Ambulatory Visit (INDEPENDENT_AMBULATORY_CARE_PROVIDER_SITE_OTHER): Payer: Medicare Other | Admitting: *Deleted

## 2011-11-10 ENCOUNTER — Encounter (INDEPENDENT_AMBULATORY_CARE_PROVIDER_SITE_OTHER): Payer: Medicare Other | Admitting: *Deleted

## 2011-11-10 VITALS — BP 151/74 | HR 67 | Resp 18 | Ht 70.0 in | Wt 200.0 lb

## 2011-11-10 DIAGNOSIS — I739 Peripheral vascular disease, unspecified: Secondary | ICD-10-CM

## 2011-11-10 DIAGNOSIS — I70219 Atherosclerosis of native arteries of extremities with intermittent claudication, unspecified extremity: Secondary | ICD-10-CM | POA: Insufficient documentation

## 2011-11-10 DIAGNOSIS — Z48812 Encounter for surgical aftercare following surgery on the circulatory system: Secondary | ICD-10-CM

## 2011-11-10 NOTE — Progress Notes (Signed)
Addended by: Sharee Pimple on: 11/10/2011 04:05 PM   Modules accepted: Orders

## 2011-11-10 NOTE — Procedures (Unsigned)
LOWER EXTREMITY ARTERIAL DUPLEX  INDICATION:  Follow up right lower extremity stent.  HISTORY: Diabetes:  Yes. Cardiac:  No. Hypertension:  Yes. Smoking:  Previous. Previous Surgery:  Right SFA and popliteal thrombectomy in 2009; right SFA stent, 12/17/10.  SINGLE LEVEL ARTERIAL EXAM                         RIGHT                LEFT Brachial:               168                  165 Anterior tibial:        153                  148 Posterior tibial:       143                  161 Peroneal: Ankle/Brachial Index:   0.91                 0.96.  LOWER EXTREMITY ARTERIAL DUPLEX EXAM  PREVIOUS ABI:  Date:  05/13/2011  Right:  1.08  Left:  0.98  DUPLEX: 1. Patent right SFA stent with significant stenosis at the proximal     segment.  Velocities are 430 cm/s in this area. 2. All waveforms are biphasic. 3. Please see attached diagram for details.  IMPRESSION:  Patent right superficial femoral artery stent with stenosis, as described above.     ___________________________________________ Quita Skye. Hart Rochester, M.D.  LT/MEDQ  D:  11/10/2011  T:  11/10/2011  Job:  [id number]

## 2011-11-10 NOTE — Progress Notes (Signed)
Subjective:     Patient ID: Fernando Combs, male   DOB: 10/09/1946, 65 y.o.   MRN: [id number]  HPI this 65 year old male patient had a stent placed in his right superficial femoral artery in January 2012. He returns today for surveillance. He denies any recurrent claudication symptoms in the right leg. He works at [employer] and walks constantly. He has no symptoms in the contralateral left leg.  Past Medical History  Diagnosis Date  . History of myocardial infarction   . Hypertension   . Hyperlipidemia   . CAD (coronary artery disease)   . Diabetic peripheral neuropathy   . Benign prostatic hypertrophy   . Diabetes mellitus type II   . History of tobacco abuse   . History of syncope     most likely vasovagal  . Thrombus 04/2008    acute thrombus of his right superficial artery with claudication  . Diabetes, polyneuropathy   . Colitis, ischemic 06/2005     history of    History  Substance Use Topics  . Smoking status: Former Smoker -- 15 years    Types: Cigarettes    Quit date: 11/24/2010  . Smokeless tobacco: Never Used  . Alcohol Use: No    Family History  Problem Relation Age of Onset  . Alcohol abuse    . Stroke    . Coronary artery disease    . Hyperlipidemia    . Other Neg Hx     denies family history of blood clots or blood disorder  . Cancer Mother   . Stroke Father     No Known Allergies  Current outpatient prescriptions:amLODipine (NORVASC) 10 MG tablet, Take 1 tablet (10 mg total) by mouth daily., Disp: 90 tablet, Rfl: 1;  aspirin EC 325 MG tablet, Take 325 mg by mouth daily.  , Disp: , Rfl: ;  atorvastatin (LIPITOR) 40 MG tablet, Take 1 tablet (40 mg total) by mouth daily., Disp: 90 tablet, Rfl: 1;  carvedilol (COREG) 25 MG tablet, Take 1 tablet (25 mg total) by mouth 2 (two) times daily with a meal., Disp: 180 tablet, Rfl: 1 colchicine 0.6 MG tablet, Take 1 tablet (0.6 mg total) by mouth daily as needed., Disp: 30 tablet, Rfl: 0;  levothyroxine (SYNTHROID,  LEVOTHROID) 50 MCG tablet, Take 1 tablet (50 mcg total) by mouth daily., Disp: 90 tablet, Rfl: 1;  losartan (COZAAR) 25 MG tablet, Take 25 mg by mouth daily.  , Disp: , Rfl:   BP 151/74  Pulse 67  Resp 18  Ht 5\' 10"  (1.778 m)  Wt 200 lb (90.719 kg)  BMI 28.70 kg/m2  Body mass index is 28.70 kg/(m^2).         Review of Systems he denies chest pain, dyspnea on exertion, PND, orthopnea     Objective:   Physical Exam blood pressure 151/74 heart rate 67 respirations 18 General he is a well-developed well-nourished male in no apparent stress alert and oriented x3 Lungs no rhonchi or wheezing Abdomen soft nontender with no masses Lower extremity exam reveals 3+ femoral popliteal posterior tibial pulses palpable bilaterally.  Today I reviewed a venous duplex exam and scan of the right leg which reveals an area of high velocity of 430 cm/s in the midportion of the right SFA stent. ABI has dropped from 1.0-0.91. He does have excellent pulses with no symptoms     Assessment:    area of high velocity in the proximal portion of right SFA stent with slight decrease ABI  but no symptoms    Plan:     Return in 3 months with repeat duplex scan right superficial femoral artery and ABIs to see if angiography should be performed. If patient has any claudication symptoms in the interim we'll be in touch with [place]  Continue aspirin

## 2011-11-10 NOTE — Progress Notes (Signed)
Addended by: Merri Ray A on: 11/10/2011 03:17 PM   Modules accepted: Orders

## 2011-12-15 ENCOUNTER — Ambulatory Visit (INDEPENDENT_AMBULATORY_CARE_PROVIDER_SITE_OTHER): Payer: Medicare Other | Admitting: Internal Medicine

## 2011-12-15 DIAGNOSIS — I10 Essential (primary) hypertension: Secondary | ICD-10-CM

## 2011-12-15 DIAGNOSIS — E119 Type 2 diabetes mellitus without complications: Secondary | ICD-10-CM

## 2011-12-15 DIAGNOSIS — E039 Hypothyroidism, unspecified: Secondary | ICD-10-CM

## 2011-12-15 LAB — BASIC METABOLIC PANEL
BUN: 24 mg/dL — ABNORMAL HIGH (ref 6–23)
CO2: 27 mEq/L (ref 19–32)
Calcium: 9.2 mg/dL (ref 8.4–10.5)
Creatinine, Ser: 1.5 mg/dL (ref 0.4–1.5)
Glucose, Bld: 101 mg/dL — ABNORMAL HIGH (ref 70–99)

## 2011-12-15 LAB — TSH: TSH: 2.36 u[IU]/mL (ref 0.35–5.50)

## 2011-12-15 MED ORDER — LEVOTHYROXINE SODIUM 50 MCG PO TABS: 50.0000 ug | ORAL_TABLET | Freq: Every day | ORAL | Status: DC

## 2011-12-15 MED ORDER — ATORVASTATIN CALCIUM 40 MG PO TABS: 40.0000 mg | ORAL_TABLET | Freq: Every day | ORAL | Status: DC

## 2011-12-15 MED ORDER — CARVEDILOL 25 MG PO TABS: 25.0000 mg | ORAL_TABLET | Freq: Two times a day (BID) | ORAL | Status: DC

## 2011-12-15 MED ORDER — AMLODIPINE BESYLATE 10 MG PO TABS: 10.0000 mg | ORAL_TABLET | Freq: Every day | ORAL | Status: DC

## 2011-12-15 MED ORDER — LOSARTAN POTASSIUM 25 MG PO TABS: 25.0000 mg | ORAL_TABLET | Freq: Every day | ORAL | Status: DC

## 2011-12-15 NOTE — Progress Notes (Signed)
Subjective:    Patient ID: Fernando Combs, male    DOB: [birth year], 66 y.o.   MRN: [id number]  HPI  66 year old white male with history of tachycardia diabetes, hypertension and peripheral artery disease for followup. Since previous visit he was seen by vascular specialist. His stenosis in his right lower extremity has gotten worse. However he is not having any symptoms of claudication. He has followup in 3 months.  He is compliant with his Lipitor. His blood pressures occasionally higher at home but usually ranges in the 130s to 140s systolic.  Diabetes mellitus type 2-he occasionally checks his blood sugar at home. He reports readings in the low 100s.  Increased stress at home - son has issues addiction to prescription pain medications Review of Systems Negative for chest pain     Past Medical History  Diagnosis Date  . History of myocardial infarction   . Hypertension   . Hyperlipidemia   . CAD (coronary artery disease)   . Diabetic peripheral neuropathy   . Benign prostatic hypertrophy   . Diabetes mellitus type II   . History of tobacco abuse   . History of syncope     most likely vasovagal  . Thrombus 04/2008    acute thrombus of his right superficial artery with claudication  . Diabetes, polyneuropathy   . Colitis, ischemic 06/2005     history of    History   Social History  . Marital Status: Married    Spouse Name: N/A    Number of Children: N/A  . Years of Education: N/A   Occupational History  . Not on file.   Social History Main Topics  . Smoking status: Former Smoker -- 15 years    Types: Cigarettes    Quit date: 11/24/2010  . Smokeless tobacco: Never Used  . Alcohol Use: No  . Drug Use: No  . Sexually Active: Not on file   Other Topics Concern  . Not on file   Social History Narrative   Occupation:  Works at [employer] - lives with wife (2nd marriage)3 children  Alcohol use-noTobacco Use - Yes.        Past Surgical History  Procedure Date   . Coronary artery bypass graft     times 4 (left interanl mammary artery to the left anterior descending, saphenous vein,graft to diagonal, saphenous vein graft to obtuse marginal, spahenous vein graft to posterior descending-Surgeon  Kathlee Nations Tright III  . Other surgical history 11/2010    angiogram, right leg stent placement-  -Dr Hart Rochester    Family History  Problem Relation Age of Onset  . Alcohol abuse    . Stroke    . Coronary artery disease    . Hyperlipidemia    . Other Neg Hx     denies family history of blood clots or blood disorder  . Cancer Mother   . Stroke Father     No Known Allergies  Current Outpatient Prescriptions on File Prior to Visit  Medication Sig Dispense Refill  . aspirin EC 325 MG tablet Take 325 mg by mouth daily.        . colchicine 0.6 MG tablet Take 1 tablet (0.6 mg total) by mouth daily as needed.  30 tablet  0    BP 146/90  Pulse 88  Temp(Src) 97.9 F (36.6 C) (Oral)  Wt 205 lb (92.987 kg)    Objective:   Physical Exam  Constitutional: He appears well-developed and well-nourished.  Cardiovascular: Normal rate, regular  rhythm and normal heart sounds.   Pulmonary/Chest: Effort normal and breath sounds normal. He has no wheezes. He has no rales.  Musculoskeletal: He exhibits no edema.  Skin: Skin is warm and dry.       Assessment & Plan:

## 2011-12-15 NOTE — Assessment & Plan Note (Signed)
BP with manual cuff is 138/80.  I am reluctant to be too aggressive with BP control.  Previously, he suffered from ischemic colitis when his blood pressure got too low.  BP: 146/90 mmHg

## 2011-12-15 NOTE — Assessment & Plan Note (Signed)
Monitor thyroid function

## 2011-12-15 NOTE — Assessment & Plan Note (Signed)
Currently managed with diet and exercise. Monitor A1c.

## 2011-12-17 ENCOUNTER — Encounter: Payer: Self-pay | Admitting: Internal Medicine

## 2012-01-11 ENCOUNTER — Emergency Department (HOSPITAL_COMMUNITY)
Admission: EM | Admit: 2012-01-11 | Discharge: 2012-01-11 | Disposition: A | Discharge status: Home / Self Care | Payer: Medicare Other | Source: Home / Self Care | Attending: Family Medicine | Admitting: Family Medicine

## 2012-01-11 ENCOUNTER — Encounter (HOSPITAL_COMMUNITY): Payer: Self-pay

## 2012-01-11 DIAGNOSIS — B029 Zoster without complications: Secondary | ICD-10-CM

## 2012-01-11 MED ORDER — LIDOCAINE 5 % EX OINT: TOPICAL_OINTMENT | CUTANEOUS | Status: DC | PRN

## 2012-01-11 MED ORDER — VALACYCLOVIR HCL 1 G PO TABS: 1000.0000 mg | ORAL_TABLET | Freq: Three times a day (TID) | ORAL | Status: AC

## 2012-01-11 MED ORDER — VALACYCLOVIR HCL 1 G PO TABS: 1000.0000 mg | ORAL_TABLET | Freq: Three times a day (TID) | ORAL | Status: DC

## 2012-01-11 MED ORDER — HYDROCODONE-ACETAMINOPHEN 5-500 MG PO TABS: 1.0000 | ORAL_TABLET | Freq: Four times a day (QID) | ORAL | Status: AC | PRN

## 2012-01-11 NOTE — ED Notes (Signed)
4 day history of pain noted from left lower back that radiated around flank area to front groin.  Noted 2 small raised red bumps on middle of lower back.  Pt. states the bumps burns and hurts.    Denies itching.  Pt. Denies having any further rash any where else on body.

## 2012-01-12 NOTE — ED Provider Notes (Signed)
History     CSN: [id number]  Arrival date & time 01/11/12  1015   First MD Initiated Contact with Patient 01/11/12 1130      Chief Complaint  Patient presents with  . Herpes Zoster    4 day history of pain noted from left lower back that radiated around flank area to front groin.  Noted 2 small raised red bumps on middle of lower back.  Pt. states the bumps burns and hurts.      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 Here c/o 4 days with a tender rash in left lower back with linear anterior radiation. Initially noted 2 raised papules in mid lower back has extended some in linear pattern. Burning sensation and tender to touch and contact with clothing, no itchiness no drainage, no fever. Has not had zoster vaccine. No rash in other body areas.    Past Medical History  Diagnosis Date  . History of myocardial infarction   . Hypertension   . Hyperlipidemia   . CAD (coronary artery disease)   . Diabetic peripheral neuropathy   . Benign prostatic hypertrophy   . Diabetes mellitus type II   . History of tobacco abuse   . History of syncope     most likely vasovagal  . Thrombus 04/2008    acute thrombus of his right superficial artery with claudication  . Diabetes, polyneuropathy   . Colitis, ischemic 06/2005     history of    Past Surgical History  Procedure Date  . Coronary artery bypass graft     times 4 (left interanl mammary artery to the left anterior descending, saphenous vein,graft to diagonal, saphenous vein graft to obtuse marginal, spahenous vein graft to posterior descending-Surgeon  Kathlee Nations Tright III  . Other surgical history 11/2010    angiogram, right leg stent placement-  -Dr Hart Rochester  . Peripheral vascular surgery     Family History  Problem Relation Age of Onset  . Alcohol abuse    . Stroke    . Coronary artery disease     . Hyperlipidemia    . Other Neg Hx     denies family history of blood clots or blood disorder  . Cancer Mother   . Stroke Father     History  Substance Use Topics  . Smoking status: Current Some Day Smoker -- 15 years    Types: Cigarettes    Last Attempt to Quit: 11/24/2010  . Smokeless tobacco: Never Used  . Alcohol Use: No      Review of Systems  Constitutional: Negative for fever and chills.  HENT: Negative for congestion.   Skin: Positive for rash.       As HPI  All other systems reviewed and are negative.    Allergies  Review of patient's allergies indicates no known allergies.  Home Medications   Current Outpatient Rx  Name Route Sig Dispense Refill  . AMLODIPINE BESYLATE 10 MG PO TABS Oral Take 1 tablet (10 mg total) by mouth daily. 90 tablet 1  . ASPIRIN EC 325 MG PO TBEC Oral Take 325 mg by mouth daily.      . ATORVASTATIN CALCIUM 40 MG PO TABS Oral Take 1 tablet (40 mg total) by mouth daily. 90 tablet 1  . CARVEDILOL 25 MG PO TABS Oral Take 1 tablet (25 mg total) by mouth 2 (two) times daily with a meal. 180 tablet 1  .  LEVOTHYROXINE SODIUM 50 MCG PO TABS Oral Take 1 tablet (50 mcg total) by mouth daily. 90 tablet 1  . LOSARTAN POTASSIUM 25 MG PO TABS Oral Take 1 tablet (25 mg total) by mouth daily. 90 tablet 1  . COLCHICINE 0.6 MG PO TABS Oral Take 1 tablet (0.6 mg total) by mouth daily as needed. 30 tablet 0    [facility] # [id number] Supervising Physician Dr. Thomos Lemons ...  . HYDROCODONE-ACETAMINOPHEN 5-500 MG PO TABS Oral Take 1-2 tablets by mouth every 6 (six) hours as needed for pain. 15 tablet 0  . LIDOCAINE 5 % EX OINT Topical Apply topically as needed. Apply in affected skin tid/prn 35.44 g 0  . VALACYCLOVIR HCL 1 G PO TABS Oral Take 1 tablet (1,000 mg total) by mouth 3 (three) times daily. Take for 7 days 21 tablet 0    BP 152/87  Pulse 82  Temp(Src) 98.4 F (36.9 C) (Oral)  Resp 14  SpO2 96%  Physical Exam  Nursing note and vitals  reviewed. Constitutional: He is oriented to person, place, and time. He appears well-developed and well-nourished. No distress.  HENT:  Head: Normocephalic and atraumatic.  Cardiovascular: Normal rate, regular rhythm and normal heart sounds.   Pulmonary/Chest: No respiratory distress. He has no wheezes. He has no rales.  Abdominal: Soft.  Neurological: He is alert and oriented to person, place, and time.  Skin:       Confluent papular rash with microvesicles in 2 plaques with a linea trajectory low mid back towards left flank. Hyperesthesia. No significant base erythema or induration, no pustules or drainage. No signs of cellulitis or over infection.     ED Course  Procedures (including critical care time)  Labs Reviewed - No data to display No results found.   1. Herpes zoster       MDM  Treated with valacyclovir, Vicodin and lidocaine ointment. Decided to defer oral steroids as patient is diabetic and there is inconclusive evidence for benefits preventing postherpetic neuralgia. Asked to follow up with PCP if persistent pain after rash is resolved.        Sharin Grave, MD 01/12/12 1149

## 2012-02-09 ENCOUNTER — Encounter: Payer: Self-pay | Admitting: Vascular Surgery

## 2012-02-10 ENCOUNTER — Ambulatory Visit: Payer: Medicare Other | Admitting: Vascular Surgery

## 2012-02-23 ENCOUNTER — Encounter: Payer: Self-pay | Admitting: Vascular Surgery

## 2012-02-24 ENCOUNTER — Encounter: Payer: Self-pay | Admitting: *Deleted

## 2012-02-24 ENCOUNTER — Ambulatory Visit (INDEPENDENT_AMBULATORY_CARE_PROVIDER_SITE_OTHER): Payer: Medicare Other | Admitting: Vascular Surgery

## 2012-02-24 ENCOUNTER — Encounter: Payer: Self-pay | Admitting: Vascular Surgery

## 2012-02-24 VITALS — BP 154/74 | HR 71 | Resp 16 | Ht 70.0 in | Wt 208.2 lb

## 2012-02-24 DIAGNOSIS — I739 Peripheral vascular disease, unspecified: Secondary | ICD-10-CM

## 2012-02-24 DIAGNOSIS — Z48812 Encounter for surgical aftercare following surgery on the circulatory system: Secondary | ICD-10-CM

## 2012-02-24 DIAGNOSIS — I70219 Atherosclerosis of native arteries of extremities with intermittent claudication, unspecified extremity: Secondary | ICD-10-CM

## 2012-02-24 NOTE — Progress Notes (Signed)
Subjective:     Patient ID: Fernando Combs, male   DOB: 25-Jan-1946, 66 y.o.   MRN: [id number]  HPI this 66 year old male had PTA and stenting of the right superficial femoral artery performed Dr. Myra Gianotti in January of 2012. He had complete relief of his claudication symptoms in the right calf. When I saw him 3 months ago the ABI had decreased from 1-0.9 and there was evidence of a stenosis within the stent proximally with velocity of 430 cm/s. He now states that at work he must push something very heavy his right calf will begin to get weak. He has no rest pain or history of nonhealing ulcers and no symptoms in the contralateral left leg.  Past Medical History  Diagnosis Date  . History of myocardial infarction   . Hypertension   . Hyperlipidemia   . CAD (coronary artery disease)   . Diabetic peripheral neuropathy   . Benign prostatic hypertrophy   . Diabetes mellitus type II   . History of tobacco abuse   . History of syncope     most likely vasovagal  . Thrombus 04/2008    acute thrombus of his right superficial artery with claudication  . Diabetes, polyneuropathy   . Colitis, ischemic 06/2005     history of  . DVT (deep venous thrombosis)   . Myocardial infarction   . Peripheral vascular disease     History  Substance Use Topics  . Smoking status: Current Some Day Smoker -- 15 years    Types: Cigarettes    Last Attempt to Quit: 11/24/2010  . Smokeless tobacco: Never Used  . Alcohol Use: No    Family History  Problem Relation Age of Onset  . Alcohol abuse    . Stroke    . Coronary artery disease    . Hyperlipidemia    . Other Neg Hx     denies family history of blood clots or blood disorder  . Cancer Mother   . Deep vein thrombosis Mother     Varicose Veins  . Heart disease Mother   . Hyperlipidemia Mother   . Hypertension Mother   . Peripheral vascular disease Mother   . Stroke Father   . Deep vein thrombosis Father   . Diabetes Father   . Heart disease Father       Heart Disease  before age 66  . Hyperlipidemia Father   . Hypertension Father   . Heart attack Father   . Peripheral vascular disease Father   . Deep vein thrombosis Brother   . Diabetes Brother   . Heart disease Brother     Heart Disease before age 54  . Hyperlipidemia Brother   . Hypertension Brother   . Heart attack Brother   . Peripheral vascular disease Brother     No Known Allergies  Current outpatient prescriptions:amLODipine (NORVASC) 10 MG tablet, Take 1 tablet (10 mg total) by mouth daily., Disp: 90 tablet, Rfl: 1;  aspirin EC 325 MG tablet, Take 325 mg by mouth daily.  , Disp: , Rfl: ;  atorvastatin (LIPITOR) 40 MG tablet, Take 1 tablet (40 mg total) by mouth daily., Disp: 90 tablet, Rfl: 1;  carvedilol (COREG) 25 MG tablet, Take 1 tablet (25 mg total) by mouth 2 (two) times daily with a meal., Disp: 180 tablet, Rfl: 1 colchicine 0.6 MG tablet, Take 1 tablet (0.6 mg total) by mouth daily as needed., Disp: 30 tablet, Rfl: 0;  levothyroxine (SYNTHROID, LEVOTHROID) 50 MCG tablet, Take  1 tablet (50 mcg total) by mouth daily., Disp: 90 tablet, Rfl: 1;  lidocaine (XYLOCAINE) 5 % ointment, Apply topically as needed. Apply in affected skin tid/prn, Disp: 35.44 g, Rfl: 0;  losartan (COZAAR) 25 MG tablet, Take 1 tablet (25 mg total) by mouth daily., Disp: 90 tablet, Rfl: 1  BP 154/74  Pulse 71  Resp 16  Ht 5\' 10"  (1.778 m)  Wt 208 lb 3.2 oz (94.439 kg)  BMI 29.87 kg/m2  SpO2 100%  Body mass index is 29.87 kg/(m^2).          Review of Systems chest pain, dyspnea on exertion, PND, orthopnea, chronic bronchitis, hemoptysis-other systems are negative and complete review of systems     Objective:   Physical Exam blood pressure 104/74 heart rate 71 respirations 16 Gen.-alert and oriented x3 in no apparent distress HEENT normal for age Lungs no rhonchi or wheezing Cardiovascular regular rhythm no murmurs carotid pulses 3+ palpable no bruits audible Abdomen soft nontender  no palpable masses Musculoskeletal free of  major deformities Skin clear -no rashes Neurologic normal Lower extremities 3+ femoral and dorsalis pedis pulse right leg-1-2+ posterior tibial pulse right leg Left leg with 3+ femoral 2+ popliteal 2+ posterior tibial pulse palpable Both feet well perfused  Today I ordered a duplex scan of the right lower extremity and ABIs. ABI on the right leg is further diminished 0.8 and the stenosis in the stent now has a velocity of 464 cm per sec       Assessment:     Symptomatic in-stent stenosis right SFA    Plan:     Angiography and probable PTA of in stent stenosis right SFA by Dr. Myra Gianotti

## 2012-02-24 NOTE — Progress Notes (Signed)
Right LE arterial DUplex performed 02/24/2012 @ [facility]

## 2012-02-24 NOTE — Progress Notes (Signed)
ABI performed @ [facility] on 02/24/2012

## 2012-02-25 ENCOUNTER — Other Ambulatory Visit: Payer: Self-pay | Admitting: *Deleted

## 2012-02-25 NOTE — Procedures (Unsigned)
LOWER EXTREMITY ARTERIAL DUPLEX  INDICATION:  Peripheral vascular disease.  HISTORY: Diabetes:  Yes. Cardiac:  No. Hypertension:  Yes. Smoking:  Previous. Previous Surgery:  Right superficial femoral artery and popliteal artery thrombectomy in 2009; right superficial femoral artery stent, 12/17/2010.  SINGLE LEVEL ARTERIAL EXAM                         RIGHT                LEFT Brachial: Anterior tibial: Posterior tibial: Peroneal: Ankle/Brachial Index:   0.80                 0.85.  PREVIOUS ANKLE BRACHIAL INDEX:  Date: 11/10/2011  Right:  0.91  Left: 0.96  LOWER EXTREMITY ARTERIAL DUPLEX EXAM  DUPLEX: 1. Elevated velocities at the right distal external iliac artery     suggestive of 30% to 49% stenosis. 2. In-stent stenosis present in the right superficial femoral artery     stent at the proximal segment with a peak systolic velocity of 464     cm/s and a ratio of 6.9. 3. Abnormal retrograde flow noted in the right peroneal artery, which     may be suggestive of a more proximal stenosis versus occlusion.   IMPRESSION: 1. Right native artery disease, as noted above. 2. In-stent stenosis of the right lower extremity, as noted above. 3. Bilateral ankle brachial indices are in the mild claudication range     and have decreased since the previous study on 11/10/2011.       ___________________________________________ Quita Skye. Hart Rochester, M.D.  SH/MEDQ  D:  02/25/2012  T:  02/25/2012  Job:  [id number]

## 2012-02-28 ENCOUNTER — Encounter (HOSPITAL_COMMUNITY): Payer: Self-pay | Admitting: Pharmacy Technician

## 2012-03-08 MED ORDER — SODIUM CHLORIDE 0.9 % IV SOLN
INTRAVENOUS | Status: DC
  Administered 2012-03-09: 100 mL/h via INTRAVENOUS

## 2012-03-09 ENCOUNTER — Encounter (HOSPITAL_COMMUNITY)
Admission: RE | Disposition: A | Discharge status: Home / Self Care | Payer: Self-pay | Source: Ambulatory Visit | Attending: Surgery

## 2012-03-09 ENCOUNTER — Ambulatory Visit (HOSPITAL_COMMUNITY)
Admission: RE | Admit: 2012-03-09 | Discharge: 2012-03-09 | Disposition: A | Discharge status: Home / Self Care | Payer: Medicare Other | Source: Ambulatory Visit | Attending: Surgery | Admitting: Surgery

## 2012-03-09 DIAGNOSIS — I70219 Atherosclerosis of native arteries of extremities with intermittent claudication, unspecified extremity: Secondary | ICD-10-CM

## 2012-03-09 DIAGNOSIS — T82898A Other specified complication of vascular prosthetic devices, implants and grafts, initial encounter: Secondary | ICD-10-CM | POA: Insufficient documentation

## 2012-03-09 DIAGNOSIS — Y831 Surgical operation with implant of artificial internal device as the cause of abnormal reaction of the patient, or of later complication, without mention of misadventure at the time of the procedure: Secondary | ICD-10-CM | POA: Insufficient documentation

## 2012-03-09 DIAGNOSIS — E1142 Type 2 diabetes mellitus with diabetic polyneuropathy: Secondary | ICD-10-CM | POA: Insufficient documentation

## 2012-03-09 DIAGNOSIS — I10 Essential (primary) hypertension: Secondary | ICD-10-CM | POA: Insufficient documentation

## 2012-03-09 DIAGNOSIS — E1149 Type 2 diabetes mellitus with other diabetic neurological complication: Secondary | ICD-10-CM | POA: Insufficient documentation

## 2012-03-09 DIAGNOSIS — E785 Hyperlipidemia, unspecified: Secondary | ICD-10-CM | POA: Insufficient documentation

## 2012-03-09 HISTORY — PX: LOWER EXTREMITY ANGIOGRAM: SHX5955

## 2012-03-09 HISTORY — PX: LOWER EXTREMITY ANGIOGRAM: SHX5508

## 2012-03-09 LAB — POCT I-STAT, CHEM 8
Creatinine, Ser: 1.3 mg/dL (ref 0.50–1.35)
HCT: 40 % (ref 39.0–52.0)
Hemoglobin: 13.6 g/dL (ref 13.0–17.0)
Potassium: 4.5 mEq/L (ref 3.5–5.1)
Sodium: 145 mEq/L (ref 135–145)
TCO2: 25 mmol/L (ref 0–100)

## 2012-03-09 LAB — POCT ACTIVATED CLOTTING TIME
Activated Clotting Time: 243 seconds
Activated Clotting Time: 171 seconds
Activated Clotting Time: 193 seconds

## 2012-03-09 SURGERY — ANGIOGRAM, LOWER EXTREMITY
Anesthesia: LOCAL | Laterality: Right

## 2012-03-09 MED ORDER — LIDOCAINE HCL (PF) 1 % IJ SOLN
INTRAMUSCULAR | Status: AC
  Filled 2012-03-09: qty 30

## 2012-03-09 MED ORDER — ONDANSETRON HCL 4 MG/2ML IJ SOLN: 4.0000 mg | Freq: Four times a day (QID) | INTRAMUSCULAR | Status: DC | PRN

## 2012-03-09 MED ORDER — MORPHINE SULFATE 10 MG/ML IJ SOLN: 2.0000 mg | INTRAMUSCULAR | Status: DC | PRN

## 2012-03-09 MED ORDER — OXYCODONE HCL 5 MG PO TABS
5.0000 mg | ORAL_TABLET | ORAL | Status: DC | PRN
  Filled 2012-03-09: qty 2

## 2012-03-09 MED ORDER — ACETAMINOPHEN 325 MG RE SUPP
325.0000 mg | RECTAL | Status: DC | PRN
  Filled 2012-03-09: qty 2

## 2012-03-09 MED ORDER — METOPROLOL TARTRATE 1 MG/ML IV SOLN: 2.0000 mg | INTRAVENOUS | Status: DC | PRN

## 2012-03-09 MED ORDER — VERAPAMIL HCL 2.5 MG/ML IV SOLN
INTRAVENOUS | Status: AC
  Filled 2012-03-09: qty 2

## 2012-03-09 MED ORDER — FENTANYL CITRATE 0.05 MG/ML IJ SOLN
INTRAMUSCULAR | Status: AC
  Filled 2012-03-09: qty 2

## 2012-03-09 MED ORDER — NITROGLYCERIN 0.2 MG/ML ON CALL CATH LAB
INTRAVENOUS | Status: AC
  Filled 2012-03-09: qty 1

## 2012-03-09 MED ORDER — CLONIDINE HCL 0.2 MG PO TABS
0.2000 mg | ORAL_TABLET | ORAL | Status: DC | PRN
  Filled 2012-03-09: qty 1

## 2012-03-09 MED ORDER — HEPARIN (PORCINE) IN NACL 2-0.9 UNIT/ML-% IJ SOLN
INTRAMUSCULAR | Status: AC
  Filled 2012-03-09: qty 1000

## 2012-03-09 MED ORDER — MIDAZOLAM HCL 2 MG/2ML IJ SOLN
INTRAMUSCULAR | Status: AC
  Filled 2012-03-09: qty 2

## 2012-03-09 MED ORDER — ACETAMINOPHEN 325 MG PO TABS
325.0000 mg | ORAL_TABLET | ORAL | Status: DC | PRN
  Filled 2012-03-09: qty 2

## 2012-03-09 MED ORDER — HEPARIN SODIUM (PORCINE) 1000 UNIT/ML IJ SOLN
INTRAMUSCULAR | Status: AC
  Filled 2012-03-09: qty 1

## 2012-03-09 MED ORDER — SODIUM CHLORIDE 0.9 % IV SOLN: 1.0000 mL/kg/h | INTRAVENOUS | Status: DC

## 2012-03-09 NOTE — Discharge Instructions (Signed)

## 2012-03-09 NOTE — Progress Notes (Signed)
UP AND WALKED AND TOL WELL LEFT GROIN STABLE NO BLEEDING OR HEMATOMA 

## 2012-03-09 NOTE — Interval H&P Note (Signed)
History and Physical Interval Note:  03/09/2012 12:50 PM  Fernando Combs  has presented today for surgery, with the diagnosis of pvd  The various methods of treatment have been discussed with the patient and family. After consideration of risks, benefits and other options for treatment, the patient has consented to  Procedure(s) (LRB): LOWER EXTREMITY ANGIOGRAM (Right) as a surgical intervention .  The patients' history has been reviewed, patient examined, no change in status, stable for surgery.  I have reviewed the patients' chart and labs.  Questions were answered to the patient's satisfaction.     [name] IV, V. WELLS

## 2012-03-09 NOTE — H&P (View-Only) (Signed)
Subjective:     Patient ID: Fernando Combs, male   DOB: 07/11/1946, 66 y.o.   MRN: [id number]  HPI this 66-year-old male had PTA and stenting of the right superficial femoral artery performed Dr. Brabham in January of 2012. He had complete relief of his claudication symptoms in the right calf. When I saw him 3 months ago the ABI had decreased from 1-0.9 and there was evidence of a stenosis within the stent proximally with velocity of 430 cm/s. He now states that at work he must push something very heavy his right calf will begin to get weak. He has no rest pain or history of nonhealing ulcers and no symptoms in the contralateral left leg.  Past Medical History  Diagnosis Date  . History of myocardial infarction   . Hypertension   . Hyperlipidemia   . CAD (coronary artery disease)   . Diabetic peripheral neuropathy   . Benign prostatic hypertrophy   . Diabetes mellitus type II   . History of tobacco abuse   . History of syncope     most likely vasovagal  . Thrombus 04/2008    acute thrombus of his right superficial artery with claudication  . Diabetes, polyneuropathy   . Colitis, ischemic 06/2005     history of  . DVT (deep venous thrombosis)   . Myocardial infarction   . Peripheral vascular disease     History  Substance Use Topics  . Smoking status: Current Some Day Smoker -- 15 years    Types: Cigarettes    Last Attempt to Quit: 11/24/2010  . Smokeless tobacco: Never Used  . Alcohol Use: No    Family History  Problem Relation Age of Onset  . Alcohol abuse    . Stroke    . Coronary artery disease    . Hyperlipidemia    . Other Neg Hx     denies family history of blood clots or blood disorder  . Cancer Mother   . Deep vein thrombosis Mother     Varicose Veins  . Heart disease Mother   . Hyperlipidemia Mother   . Hypertension Mother   . Peripheral vascular disease Mother   . Stroke Father   . Deep vein thrombosis Father   . Diabetes Father   . Heart disease Father       Heart Disease  before age 60  . Hyperlipidemia Father   . Hypertension Father   . Heart attack Father   . Peripheral vascular disease Father   . Deep vein thrombosis Brother   . Diabetes Brother   . Heart disease Brother     Heart Disease before age 60  . Hyperlipidemia Brother   . Hypertension Brother   . Heart attack Brother   . Peripheral vascular disease Brother     No Known Allergies  Current outpatient prescriptions:amLODipine (NORVASC) 10 MG tablet, Take 1 tablet (10 mg total) by mouth daily., Disp: 90 tablet, Rfl: 1;  aspirin EC 325 MG tablet, Take 325 mg by mouth daily.  , Disp: , Rfl: ;  atorvastatin (LIPITOR) 40 MG tablet, Take 1 tablet (40 mg total) by mouth daily., Disp: 90 tablet, Rfl: 1;  carvedilol (COREG) 25 MG tablet, Take 1 tablet (25 mg total) by mouth 2 (two) times daily with a meal., Disp: 180 tablet, Rfl: 1 colchicine 0.6 MG tablet, Take 1 tablet (0.6 mg total) by mouth daily as needed., Disp: 30 tablet, Rfl: 0;  levothyroxine (SYNTHROID, LEVOTHROID) 50 MCG tablet, Take   1 tablet (50 mcg total) by mouth daily., Disp: 90 tablet, Rfl: 1;  lidocaine (XYLOCAINE) 5 % ointment, Apply topically as needed. Apply in affected skin tid/prn, Disp: 35.44 g, Rfl: 0;  losartan (COZAAR) 25 MG tablet, Take 1 tablet (25 mg total) by mouth daily., Disp: 90 tablet, Rfl: 1  BP 154/74  Pulse 71  Resp 16  Ht 5' 10" (1.778 m)  Wt 208 lb 3.2 oz (94.439 kg)  BMI 29.87 kg/m2  SpO2 100%  Body mass index is 29.87 kg/(m^2).          Review of Systems chest pain, dyspnea on exertion, PND, orthopnea, chronic bronchitis, hemoptysis-other systems are negative and complete review of systems     Objective:   Physical Exam blood pressure 104/74 heart rate 71 respirations 16 Gen.-alert and oriented x3 in no apparent distress HEENT normal for age Lungs no rhonchi or wheezing Cardiovascular regular rhythm no murmurs carotid pulses 3+ palpable no bruits audible Abdomen soft nontender  no palpable masses Musculoskeletal free of  major deformities Skin clear -no rashes Neurologic normal Lower extremities 3+ femoral and dorsalis pedis pulse right leg-1-2+ posterior tibial pulse right leg Left leg with 3+ femoral 2+ popliteal 2+ posterior tibial pulse palpable Both feet well perfused  Today I ordered a duplex scan of the right lower extremity and ABIs. ABI on the right leg is further diminished 0.8 and the stenosis in the stent now has a velocity of 464 cm per sec       Assessment:     Symptomatic in-stent stenosis right SFA    Plan:     Angiography and probable PTA of in stent stenosis right SFA by Dr. Brabham      

## 2012-03-09 NOTE — Op Note (Signed)
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  ultrasound access left femoral artery  2.  abdominal aortogram  3.  bilateral lower extremity runoff  4.  Atherectomy and angioplasty right superficial femoral artery   Indications:  The patient has had return of his claudication symptoms. A year ago he underwent stenting of his right superficial femoral artery with a 7 x 60 self-expanding stent. Ultrasound has revealed in-stent stenosis with velocities greater than 400 cm/s. He comes in today for diagnostic procedure with a possible intervention.  Procedure:  The patient was identified in the holding area and taken to room 8.  The patient was then placed supine on the table and prepped and draped in the usual sterile fashion.  A time out was called.  Ultrasound was used to evaluate the left common femoral artery.  It was patent .  A digital ultrasound image was acquired.  A micropuncture needle was used to access the left common femoral artery under ultrasound guidance.  An 018 wire was advanced without resistance and a micropuncture sheath was placed.  The 018 wire was removed and a benson wire was placed.  The micropuncture sheath was exchanged for a 5 french sheath.  An omniflush catheter was advanced over the wire to the level of L-1.  An abdominal angiogram was obtained.  Next, using the omniflush catheter and a benson wire, the aortic bifurcation was crossed and the catheter was placed into theright external iliac artery and right runoff was obtained.  left runoff was performed via retrograde sheath injections.  Findings:   Aortogram:  The visualized portions of the suprarenal abdominal aorta showed no significant disease. There is no evidence of renal artery stenosis.  Bilateral common external and internal iliac arteries are widely patent.  Right Lower Extremity:  The right common femoral artery is widely patent. The right profunda femoral artery is widely patent. The right superficial femoral and popliteal artery are patent there is a focal high-grade stenosis within the previously deployed stent, approximately 90%. There is single vessel runoff via the posterior tibial artery.  Left Lower Extremity:  The left common femoral artery is widely patent the left profunda femoris widely patent. The left superficial femoral artery is patent. There is approximately a 50% stenosis within the popliteal artery. There is single vessel runoff via the posterior tibial artery.  Intervention:  After the above images were obtained the decision was made to proceed with intervention. Over a 035 wire a 7 [place] Ansel 1 sheath was advanced over the bifurcation into the right external iliac artery. Patient was fully heparinized. I selected a 014 viper and used it to cross the stenosis within the stent. I selected a CSI  Atherectomy device, 2.0 solid crown. Atherectomy was performed of the in-stent stenosis at low medium and high speed 6. I then used a 6 x 40 balloon to perform balloon angioplasty within the stent. A followup study revealed residual stenosis in the proximal portion of the stent and therefore I upsized to a 7 x 2 balloon and repeat a balloon angioplasty in this area taking the balloon to burst pressure and holding and upper 90 seconds. Completion study revealed significant improvement of the in-stent stenosis and runoff that was unchanged. At this point catheters and wires were removed. The sheath was pulled back  the left external iliac artery. The patient taken the holding area for sheath pull once the coagulation profile corrects to   Impression:  #1  successful atherectomy and angioplasty of the in-stent stenosis within the right superficial femoral stent using a CSI 2.0  device and a 6 x 40 followed by 7 x 20 balloon  #2  single vessel runoff via the posterior tibial artery on the right  #3  50% stenosis within the left popliteal artery.    Juleen China, M.D. [facility] Office: [phone] Pager:  [phone]

## 2012-03-10 ENCOUNTER — Other Ambulatory Visit: Payer: Self-pay | Admitting: *Deleted

## 2012-03-10 DIAGNOSIS — Z48812 Encounter for surgical aftercare following surgery on the circulatory system: Secondary | ICD-10-CM

## 2012-03-10 DIAGNOSIS — I70219 Atherosclerosis of native arteries of extremities with intermittent claudication, unspecified extremity: Secondary | ICD-10-CM

## 2012-04-12 ENCOUNTER — Encounter: Payer: Self-pay | Admitting: Internal Medicine

## 2012-04-12 ENCOUNTER — Encounter: Payer: Self-pay | Admitting: Vascular Surgery

## 2012-04-12 ENCOUNTER — Ambulatory Visit (INDEPENDENT_AMBULATORY_CARE_PROVIDER_SITE_OTHER): Payer: Medicare Other | Admitting: Internal Medicine

## 2012-04-12 VITALS — BP 130/68 | HR 68 | Temp 98.3°F | Ht 70.0 in | Wt 195.0 lb

## 2012-04-12 DIAGNOSIS — E119 Type 2 diabetes mellitus without complications: Secondary | ICD-10-CM

## 2012-04-12 DIAGNOSIS — I70219 Atherosclerosis of native arteries of extremities with intermittent claudication, unspecified extremity: Secondary | ICD-10-CM

## 2012-04-12 DIAGNOSIS — E785 Hyperlipidemia, unspecified: Secondary | ICD-10-CM

## 2012-04-12 DIAGNOSIS — I10 Essential (primary) hypertension: Secondary | ICD-10-CM

## 2012-04-12 DIAGNOSIS — B029 Zoster without complications: Secondary | ICD-10-CM | POA: Insufficient documentation

## 2012-04-12 LAB — HEMOGLOBIN A1C: Hgb A1c MFr Bld: 6 % (ref 4.6–6.5)

## 2012-04-12 LAB — LIPID PANEL
Cholesterol: 98 mg/dL (ref 0–200)
HDL: 37.1 mg/dL — ABNORMAL LOW (ref >39.00)
Triglycerides: 133 mg/dL (ref 0.0–149.0)

## 2012-04-12 LAB — BASIC METABOLIC PANEL
Calcium: 9.5 mg/dL (ref 8.4–10.5)
GFR: 51.63 mL/min — ABNORMAL LOW (ref >60.00)
Glucose, Bld: 81 mg/dL (ref 70–99)
Potassium: 4 mEq/L (ref 3.5–5.1)
Sodium: 141 mEq/L (ref 135–145)

## 2012-04-12 LAB — HEPATIC FUNCTION PANEL
ALT: 14 U/L (ref 0–53)
AST: 17 U/L (ref 0–37)
Albumin: 4.1 g/dL (ref 3.5–5.2)
Alkaline Phosphatase: 58 U/L (ref 39–117)
Total Protein: 7.5 g/dL (ref 6.0–8.3)

## 2012-04-12 MED ORDER — ATORVASTATIN CALCIUM 40 MG PO TABS: 40.0000 mg | ORAL_TABLET | Freq: Every day | ORAL | Status: DC

## 2012-04-12 MED ORDER — LOSARTAN POTASSIUM 25 MG PO TABS: 25.0000 mg | ORAL_TABLET | Freq: Every day | ORAL | Status: DC

## 2012-04-12 MED ORDER — CARVEDILOL 25 MG PO TABS: 25.0000 mg | ORAL_TABLET | Freq: Two times a day (BID) | ORAL | Status: DC

## 2012-04-12 MED ORDER — LEVOTHYROXINE SODIUM 50 MCG PO TABS: 50.0000 ug | ORAL_TABLET | Freq: Every day | ORAL | Status: DC

## 2012-04-12 MED ORDER — AMLODIPINE BESYLATE 10 MG PO TABS: 10.0000 mg | ORAL_TABLET | Freq: Every day | ORAL | Status: DC

## 2012-04-12 NOTE — Assessment & Plan Note (Signed)
Stable.  Monitor A1c. 

## 2012-04-12 NOTE — Patient Instructions (Signed)
Start over the counter fish oil supplement as directed (Take 2-3 capsules per day)

## 2012-04-12 NOTE — Assessment & Plan Note (Signed)
Patient suffered episode of shingles along left flank approximately 1 to 2 months ago. He was treated with Valtrex. Fortunately he does not have any significant residual postherpetic neuralgia. We discussed giving him Zostavax at next office visit.

## 2012-04-12 NOTE — Assessment & Plan Note (Signed)
Managed by vascular surgery 

## 2012-04-12 NOTE — Assessment & Plan Note (Signed)
LDL goal less than 70. Continue Lipitor 40 mg. Monitor fasting lipid profile and LFTs. Add fish oil supplement twice daily.

## 2012-04-12 NOTE — Progress Notes (Signed)
Subjective:    Patient ID: Fernando Combs, male    DOB: 1945-12-04, 66 y.o.   MRN: [id number]  HPI  66 year old white male with history of type diabetes, hypertension and peripheral vascular disease for routine followup. Since previous visit patient was seen by vascular specialist. He was experiencing claudication symptoms in his right lower extremity. Vascular specialist attempted angioplasty at previous stent restenosis site. His symptoms improved for 2 weeks but then recurred. He is also now experiencing left leg symptoms as well. He has followup with vascular specialist tomorrow. They are planning to repeat vascular studies and discussed possible need for femoropopliteal bypass.  Patient also reports suffering from episode of shingles in his left flank approximately 2 month ago.  He was treated with Valtrex. He denies persistent pain or rash.  Review of Systems Negative for chest pain.  Positive for claudication symptoms bilaterally    Past Medical History  Diagnosis Date  . History of myocardial infarction   . Hypertension   . Hyperlipidemia   . CAD (coronary artery disease)   . Diabetic peripheral neuropathy   . Benign prostatic hypertrophy   . Diabetes mellitus type II   . History of tobacco abuse   . History of syncope     most likely vasovagal  . Thrombus 04/2008    acute thrombus of his right superficial artery with claudication  . Diabetes, polyneuropathy   . Colitis, ischemic 06/2005     history of  . DVT (deep venous thrombosis)   . Myocardial infarction   . Peripheral vascular disease     History   Social History  . Marital Status: Married    Spouse Name: N/A    Number of Children: N/A  . Years of Education: N/A   Occupational History  . Not on file.   Social History Main Topics  . Smoking status: Current Some Day Smoker -- 15 years    Types: Cigarettes    Last Attempt to Quit: 11/24/2010  . Smokeless tobacco: Never Used  . Alcohol Use: No  . Drug Use:  No  . Sexually Active: Not on file   Other Topics Concern  . Not on file   Social History Narrative   Occupation:  Works at [employer] - lives with wife (2nd marriage)3 children  Alcohol use-noTobacco Use - Yes.        Past Surgical History  Procedure Date  . Other surgical history 11/2010    angiogram, right leg stent placement-  -Dr Hart [place]  . Peripheral vascular surgery   . Pr vein bypass graft,aorto-fem-pop 08/04/00  . Coronary artery bypass graft 08/04/00    times 4 (left interanl mammary artery to the left anterior descending, saphenous vein,graft to diagonal, saphenous vein graft to obtuse marginal, spahenous vein graft to posterior descending-Surgeon  Kathlee Nations Tright III    Family History  Problem Relation Age of Onset  . Alcohol abuse    . Stroke    . Coronary artery disease    . Hyperlipidemia    . Other Neg Hx     denies family history of blood clots or blood disorder  . Cancer Mother   . Deep vein thrombosis Mother     Varicose Veins  . Heart disease Mother   . Hyperlipidemia Mother   . Hypertension Mother   . Peripheral vascular disease Mother   . Stroke Father   . Deep vein thrombosis Father   . Diabetes Father   . Heart disease Father  Heart Disease  before age 67  . Hyperlipidemia Father   . Hypertension Father   . Heart attack Father   . Peripheral vascular disease Father   . Deep vein thrombosis Brother   . Diabetes Brother   . Heart disease Brother     Heart Disease before age 85  . Hyperlipidemia Brother   . Hypertension Brother   . Heart attack Brother   . Peripheral vascular disease Brother     No Known Allergies  Current Outpatient Prescriptions on File Prior to Visit  Medication Sig Dispense Refill  . aspirin EC 325 MG tablet Take 325 mg by mouth daily.        . colchicine 0.6 MG tablet Take 0.6 mg by mouth daily as needed. For gout      . DISCONTD: amLODipine (NORVASC) 10 MG tablet Take 10 mg by mouth daily.      Marland Kitchen DISCONTD:  atorvastatin (LIPITOR) 40 MG tablet Take 40 mg by mouth daily.      Marland Kitchen DISCONTD: carvedilol (COREG) 25 MG tablet Take 25 mg by mouth 2 (two) times daily with a meal.      . DISCONTD: levothyroxine (SYNTHROID, LEVOTHROID) 50 MCG tablet Take 1 tablet (50 mcg total) by mouth daily.  90 tablet  1  . DISCONTD: losartan (COZAAR) 25 MG tablet Take 1 tablet (25 mg total) by mouth daily.  90 tablet  1    BP 130/68  Pulse 68  Temp(Src) 98.3 F (36.8 C) (Oral)  Ht 5\' 10"  (1.778 m)  Wt 195 lb (88.451 kg)  BMI 27.98 kg/m2  SpO2 97%    Objective:   Physical Exam  Constitutional: He is oriented to person, place, and time. He appears well-developed and well-nourished.  Cardiovascular: Normal rate, regular rhythm and normal heart sounds.   Pulmonary/Chest: Effort normal and breath sounds normal. He has no wheezes.  Musculoskeletal: He exhibits no edema.  Neurological: He is alert and oriented to person, place, and time.  Psychiatric: He has a normal mood and affect. His behavior is normal.          Assessment & Plan:

## 2012-04-13 ENCOUNTER — Encounter: Payer: Self-pay | Admitting: Vascular Surgery

## 2012-04-13 ENCOUNTER — Encounter (INDEPENDENT_AMBULATORY_CARE_PROVIDER_SITE_OTHER): Payer: Medicare Other | Admitting: *Deleted

## 2012-04-13 ENCOUNTER — Ambulatory Visit (INDEPENDENT_AMBULATORY_CARE_PROVIDER_SITE_OTHER): Payer: Medicare Other | Admitting: Vascular Surgery

## 2012-04-13 VITALS — BP 146/73 | HR 84 | Resp 20 | Ht 70.0 in | Wt 195.0 lb

## 2012-04-13 DIAGNOSIS — I70219 Atherosclerosis of native arteries of extremities with intermittent claudication, unspecified extremity: Secondary | ICD-10-CM

## 2012-04-13 DIAGNOSIS — Z48812 Encounter for surgical aftercare following surgery on the circulatory system: Secondary | ICD-10-CM

## 2012-04-13 NOTE — Progress Notes (Signed)
Subjective:     Patient ID: Fernando Combs, male   DOB: Aug 24, 1946, 66 y.o.   MRN: [id number]  HPI this 66 year old male returns for initial followup regarding a procedure performed by Dr. Myra Gianotti 03/09/2012. He had atherectomy and angioplasty of a stent in the right superficial femoral artery which had been placed one year earlier he had developed a 90% focal mid-stent stenosis. He had a Cosmetic result and physiologic result. However he states that he continues to have severe calf discomfort when ambulating 150 feet followed by some discomfort in the left calf. He was also found to have a 50% narrowing of his left popliteal artery.  Past Medical History  Diagnosis Date  . History of myocardial infarction   . Hypertension   . Hyperlipidemia   . CAD (coronary artery disease)   . Diabetic peripheral neuropathy   . Benign prostatic hypertrophy   . Diabetes mellitus type II   . History of tobacco abuse   . History of syncope     most likely vasovagal  . Thrombus 04/2008    acute thrombus of his right superficial artery with claudication  . Diabetes, polyneuropathy   . Colitis, ischemic 06/2005     history of  . DVT (deep venous thrombosis)   . Myocardial infarction   . Peripheral vascular disease     History  Substance Use Topics  . Smoking status: Former Smoker -- 15 years    Types: Cigarettes    Quit date: 04/14/2011  . Smokeless tobacco: Never Used  . Alcohol Use: No    Family History  Problem Relation Age of Onset  . Alcohol abuse    . Stroke    . Coronary artery disease    . Hyperlipidemia    . Other Neg Hx     denies family history of blood clots or blood disorder  . Cancer Mother   . Deep vein thrombosis Mother     Varicose Veins  . Heart disease Mother   . Hyperlipidemia Mother   . Hypertension Mother   . Peripheral vascular disease Mother   . Stroke Father   . Deep vein thrombosis Father   . Diabetes Father   . Heart disease Father     Heart Disease  before  age 66  . Hyperlipidemia Father   . Hypertension Father   . Heart attack Father   . Peripheral vascular disease Father   . Deep vein thrombosis Brother   . Diabetes Brother   . Heart disease Brother     Heart Disease before age 2  . Hyperlipidemia Brother   . Hypertension Brother   . Heart attack Brother   . Peripheral vascular disease Brother     No Known Allergies  Current outpatient prescriptions:amLODipine (NORVASC) 10 MG tablet, Take 1 tablet (10 mg total) by mouth daily., Disp: 90 tablet, Rfl: 1;  aspirin EC 325 MG tablet, Take 325 mg by mouth daily.  , Disp: , Rfl: ;  atorvastatin (LIPITOR) 40 MG tablet, Take 1 tablet (40 mg total) by mouth daily., Disp: 90 tablet, Rfl: 1;  carvedilol (COREG) 25 MG tablet, Take 1 tablet (25 mg total) by mouth 2 (two) times daily with a meal., Disp: 180 tablet, Rfl: 1 colchicine 0.6 MG tablet, Take 0.6 mg by mouth daily as needed. For gout, Disp: , Rfl: ;  levothyroxine (SYNTHROID, LEVOTHROID) 50 MCG tablet, Take 1 tablet (50 mcg total) by mouth daily., Disp: 90 tablet, Rfl: 1;  losartan (COZAAR) 25  MG tablet, Take 1 tablet (25 mg total) by mouth daily., Disp: 90 tablet, Rfl: 1  BP 146/73  Pulse 84  Resp 20  Ht 5\' 10"  (1.778 m)  Wt 195 lb (88.451 kg)  BMI 27.98 kg/m2  Body mass index is 27.98 kg/(m^2).          Review of Systems denies chest pain, dyspnea on exertion, PND, orthopnea, hemoptysis.    Objective:   Physical Exam blood pressure 146/73 heart rate 84 respirations 20 General well-developed well-nourished male no apparent distress alert and oriented x3 Lungs no rhonchi or wheezing Right leg with 3+ femoral 3+ popliteal and 3+ dorsalis pedis pulse palpable. Left leg with 3+ femoral 2+ popliteal and 3+ posterior tibial pulse palpable. Both feet pink and well perfused.  Today I ordered lower extremity arterial Dopplers. ABI on the right has improved from 0.8-1.12. ABI on the left has decreased from 0.85-0.77    Assessment:       Successful atherectomy and angioplasty of right SFA stent-in-stent stenosis-90% however symptoms unchanged in right calf 50% left popliteal artery stenosis causing possible left calf claudication symptoms    Plan:     Return in 6 months with repeat ABIs and duplex scan right lower extremity to look at stent and possible recurrent in-stent stenosis Discussed with patient the fact that his symptomatology and right leg is not coincide with ABIs and physical exam

## 2012-04-14 ENCOUNTER — Encounter: Payer: Self-pay | Admitting: Internal Medicine

## 2012-04-14 NOTE — Progress Notes (Signed)
Addended by: Sharee Pimple on: 04/14/2012 08:54 AM   Modules accepted: Orders

## 2012-07-16 ENCOUNTER — Ambulatory Visit (INDEPENDENT_AMBULATORY_CARE_PROVIDER_SITE_OTHER): Payer: Medicare Other | Admitting: Internal Medicine

## 2012-07-16 ENCOUNTER — Encounter: Payer: Self-pay | Admitting: Internal Medicine

## 2012-07-16 VITALS — BP 168/90 | HR 76 | Temp 98.0°F | Wt 206.0 lb

## 2012-07-16 DIAGNOSIS — I10 Essential (primary) hypertension: Secondary | ICD-10-CM

## 2012-07-16 MED ORDER — LOSARTAN POTASSIUM 25 MG PO TABS: 25.0000 mg | ORAL_TABLET | Freq: Two times a day (BID) | ORAL | Status: DC

## 2012-07-16 NOTE — Assessment & Plan Note (Addendum)
BP suboptimally controlled.  Increase losartan to 25 mg bid.  His home SBP readings in the 160s. Monitor BMET.  BP: 168/90 mmHg

## 2012-07-16 NOTE — Progress Notes (Signed)
Subjective:    Patient ID: Fernando Combs, male    DOB: [birth year], 66 y.o.   MRN: [id number]  HPI   66 year old white male with history of type 2 diabetes, hypertension peripheral vascular disease for followup. Overall patient has been doing well since previous visit. He does monitor his blood pressure at home. He has been noticing frequent  systolic blood pressure readings  In the 160s. He is taking his blood pressure medications as directed.   DM Type II - stable.  Review of Systems Negative for chest pain or shortness of breath  Past Medical History  Diagnosis Date  . History of myocardial infarction   . Hypertension   . Hyperlipidemia   . CAD (coronary artery disease)   . Diabetic peripheral neuropathy   . Benign prostatic hypertrophy   . Diabetes mellitus type II   . History of tobacco abuse   . History of syncope     most likely vasovagal  . Thrombus 04/2008    acute thrombus of his right superficial artery with claudication  . Diabetes, polyneuropathy   . Colitis, ischemic 06/2005     history of  . DVT (deep venous thrombosis)   . Myocardial infarction   . Peripheral vascular disease     History   Social History  . Marital Status: Married    Spouse Name: N/A    Number of Children: N/A  . Years of Education: N/A   Occupational History  . Not on file.   Social History Main Topics  . Smoking status: Former Smoker -- 15 years    Types: Cigarettes    Quit date: 04/14/2011  . Smokeless tobacco: Never Used  . Alcohol Use: No  . Drug Use: No  . Sexually Active: Not on file   Other Topics Concern  . Not on file   Social History Narrative   Occupation:  Works at [employer] - lives with wife (2nd marriage)3 children  Alcohol use-noTobacco Use - Yes.        Past Surgical History  Procedure Date  . Other surgical history 11/2010    angiogram, right leg stent placement-  -Dr Hart [place]  . Peripheral vascular surgery   . Pr vein bypass graft,aorto-fem-pop  08/04/00  . Coronary artery bypass graft 08/04/00    times 4 (left interanl mammary artery to the left anterior descending, saphenous vein,graft to diagonal, saphenous vein graft to obtuse marginal, spahenous vein graft to posterior descending-Surgeon  Kathlee Nations Tright III    Family History  Problem Relation Age of Onset  . Alcohol abuse    . Stroke    . Coronary artery disease    . Hyperlipidemia    . Other Neg Hx     denies family history of blood clots or blood disorder  . Cancer Mother   . Deep vein thrombosis Mother     Varicose Veins  . Heart disease Mother   . Hyperlipidemia Mother   . Hypertension Mother   . Peripheral vascular disease Mother   . Stroke Father   . Deep vein thrombosis Father   . Diabetes Father   . Heart disease Father     Heart Disease  before age 95  . Hyperlipidemia Father   . Hypertension Father   . Heart attack Father   . Peripheral vascular disease Father   . Deep vein thrombosis Brother   . Diabetes Brother   . Heart disease Brother     Heart Disease before age 83  .  Hyperlipidemia Brother   . Hypertension Brother   . Heart attack Brother   . Peripheral vascular disease Brother     No Known Allergies  Current Outpatient Prescriptions on File Prior to Visit  Medication Sig Dispense Refill  . amLODipine (NORVASC) 10 MG tablet Take 1 tablet (10 mg total) by mouth daily.  90 tablet  1  . aspirin EC 325 MG tablet Take 325 mg by mouth daily.        Marland Kitchen atorvastatin (LIPITOR) 40 MG tablet Take 1 tablet (40 mg total) by mouth daily.  90 tablet  1  . carvedilol (COREG) 25 MG tablet Take 1 tablet (25 mg total) by mouth 2 (two) times daily with a meal.  180 tablet  1  . colchicine 0.6 MG tablet Take 0.6 mg by mouth daily as needed. For gout      . levothyroxine (SYNTHROID, LEVOTHROID) 50 MCG tablet Take 1 tablet (50 mcg total) by mouth daily.  90 tablet  1  . DISCONTD: losartan (COZAAR) 25 MG tablet Take 1 tablet (25 mg total) by mouth daily.  90  tablet  1    BP 168/90  Pulse 76  Temp 98 F (36.7 C) (Oral)  Wt 206 lb (93.441 kg)       Objective:   Physical Exam  Constitutional: He appears well-developed and well-nourished.  Cardiovascular: Normal rate, regular rhythm and normal heart sounds.   Pulmonary/Chest: Effort normal and breath sounds normal. He has no wheezes.  Skin: Skin is warm and dry.  Psychiatric: He has a normal mood and affect. His behavior is normal.          Assessment & Plan:

## 2012-07-16 NOTE — Patient Instructions (Addendum)
Please complete the following lab tests before your next follow up appointment: BMET - 401.9 A1c - 790.29 

## 2012-10-18 ENCOUNTER — Encounter: Payer: Self-pay | Admitting: Neurosurgery

## 2012-10-19 ENCOUNTER — Ambulatory Visit: Payer: Medicare Other | Admitting: Neurosurgery

## 2012-11-08 ENCOUNTER — Other Ambulatory Visit (INDEPENDENT_AMBULATORY_CARE_PROVIDER_SITE_OTHER): Payer: Medicare Other

## 2012-11-08 DIAGNOSIS — R7309 Other abnormal glucose: Secondary | ICD-10-CM

## 2012-11-08 DIAGNOSIS — I10 Essential (primary) hypertension: Secondary | ICD-10-CM

## 2012-11-08 LAB — BASIC METABOLIC PANEL
Calcium: 9.2 mg/dL (ref 8.4–10.5)
Creatinine, Ser: 1.5 mg/dL (ref 0.4–1.5)
GFR: 49.57 mL/min — ABNORMAL LOW (ref >60.00)
Sodium: 138 mEq/L (ref 135–145)

## 2012-11-15 ENCOUNTER — Ambulatory Visit: Payer: Medicare Other | Admitting: Internal Medicine

## 2012-11-15 DIAGNOSIS — Z0289 Encounter for other administrative examinations: Secondary | ICD-10-CM

## 2012-11-29 ENCOUNTER — Other Ambulatory Visit: Payer: Self-pay | Admitting: *Deleted

## 2012-11-29 DIAGNOSIS — I6529 Occlusion and stenosis of unspecified carotid artery: Secondary | ICD-10-CM

## 2012-11-30 ENCOUNTER — Ambulatory Visit: Payer: Medicare Other | Admitting: Neurosurgery

## 2012-12-06 ENCOUNTER — Encounter: Payer: Self-pay | Admitting: Neurosurgery

## 2012-12-07 ENCOUNTER — Encounter: Payer: Self-pay | Admitting: Neurosurgery

## 2012-12-07 ENCOUNTER — Ambulatory Visit (INDEPENDENT_AMBULATORY_CARE_PROVIDER_SITE_OTHER): Payer: Medicare Other | Admitting: Vascular Surgery

## 2012-12-07 ENCOUNTER — Ambulatory Visit (INDEPENDENT_AMBULATORY_CARE_PROVIDER_SITE_OTHER): Payer: Medicare Other | Admitting: Neurosurgery

## 2012-12-07 VITALS — BP 109/59 | HR 70 | Resp 16 | Ht 70.0 in | Wt 200.7 lb

## 2012-12-07 DIAGNOSIS — Z48812 Encounter for surgical aftercare following surgery on the circulatory system: Secondary | ICD-10-CM

## 2012-12-07 DIAGNOSIS — I739 Peripheral vascular disease, unspecified: Secondary | ICD-10-CM

## 2012-12-07 DIAGNOSIS — I6529 Occlusion and stenosis of unspecified carotid artery: Secondary | ICD-10-CM

## 2012-12-07 DIAGNOSIS — I70219 Atherosclerosis of native arteries of extremities with intermittent claudication, unspecified extremity: Secondary | ICD-10-CM

## 2012-12-07 NOTE — Progress Notes (Signed)
Ankle brachial index performed @ [facility] 12/07/2012

## 2012-12-07 NOTE — Progress Notes (Signed)
Carotid duplex performed @ [facility] 12/07/2012.

## 2012-12-07 NOTE — Addendum Note (Signed)
Addended by: Dannielle Karvonen on: 12/07/2012 04:25 PM   Modules accepted: Orders

## 2012-12-07 NOTE — Progress Notes (Signed)
Right lower extremity arterial duplex performed @ [facility] 12/07/2012

## 2012-12-07 NOTE — Progress Notes (Signed)
[facility] [place] Carotid Office Note  CC: Carotid and PAD surveillance Referring Physician: Hart Rochester  History of Present Illness: 67 year old male patient of Dr. Hart Rochester who underwent arthrectomy and angioplasty of the stent in the right SFA in April 2013. The patient is status post right SFA to popliteal thrombectomy in 2009 with the placement of the right SFA stent in January 2012. The patient denies claudication or rest pain and states he still works full-time without difficulty. The patient denies any signs or symptoms of CVA, TIA, amaurosis fugax or any neural deficit.  Past Medical History  Diagnosis Date  . History of myocardial infarction   . Hypertension   . Hyperlipidemia   . CAD (coronary artery disease)   . Diabetic peripheral neuropathy   . Benign prostatic hypertrophy   . Diabetes mellitus type II   . History of tobacco abuse   . History of syncope     most likely vasovagal  . Thrombus 04/2008    acute thrombus of his right superficial artery with claudication  . Diabetes, polyneuropathy   . Colitis, ischemic 06/2005     history of  . DVT (deep venous thrombosis)   . Myocardial infarction   . Peripheral vascular disease   . Carotid artery occlusion     ROS: [x]  Positive   [ ]  Denies    General: [ ]  Weight loss, [ ]  Fever, [ ]  chills Neurologic: [ ]  Dizziness, [ ]  Blackouts, [ ]  Seizure [ ]  Stroke, [ ]  "Mini stroke", [ ]  Slurred speech, [ ]  Temporary blindness; [ ]  weakness in arms or legs, [ ]  Hoarseness Cardiac: [ ]  Chest pain/pressure, [ ]  Shortness of breath at rest [ ]  Shortness of breath with exertion, [ ]  Atrial fibrillation or irregular heartbeat Vascular: [ ]  Pain in legs with walking, [ ]  Pain in legs at rest, [ ]  Pain in legs at night,  [ ]  Non-healing ulcer, [ ]  Blood clot in vein/DVT,   Pulmonary: [ ]  Home oxygen, [ ]  Productive cough, [ ]  Coughing up blood, [ ]  Asthma,  [ ]  Wheezing Musculoskeletal:  [ ]  Arthritis, [ ]  Low back  pain, [ ]  Joint pain Hematologic: [ ]  Easy Bruising, [ ]  Anemia; [ ]  Hepatitis Gastrointestinal: [ ]  Blood in stool, [ ]  Gastroesophageal Reflux/heartburn, [ ]  Trouble swallowing Urinary: [ ]  chronic Kidney disease, [ ]  on HD - [ ]  MWF or [ ]  TTHS, [ ]  Burning with urination, [ ]  Difficulty urinating Skin: [ ]  Rashes, [ ]  Wounds Psychological: [ ]  Anxiety, [ ]  Depression   Social History History  Substance Use Topics  . Smoking status: Former Smoker -- 15 years    Types: Cigarettes    Quit date: 04/14/2011  . Smokeless tobacco: Never Used  . Alcohol Use: No    Family History Family History  Problem Relation Age of Onset  . Alcohol abuse    . Stroke    . Coronary artery disease    . Hyperlipidemia    . Other Neg Hx     denies family history of blood clots or blood disorder  . Cancer Mother   . Deep vein thrombosis Mother     Varicose Veins  . Heart disease Mother   . Hyperlipidemia Mother   . Hypertension Mother   . Peripheral vascular disease Mother   . Stroke Father   . Deep vein thrombosis Father   . Diabetes Father   . Heart disease Father  Heart Disease  before age 12  . Hyperlipidemia Father   . Hypertension Father   . Heart attack Father   . Peripheral vascular disease Father   . Deep vein thrombosis Brother   . Diabetes Brother   . Heart disease Brother     Heart Disease before age 48  . Hyperlipidemia Brother   . Hypertension Brother   . Heart attack Brother   . Peripheral vascular disease Brother     No Known Allergies  Current Outpatient Prescriptions  Medication Sig Dispense Refill  . amLODipine (NORVASC) 10 MG tablet Take 1 tablet (10 mg total) by mouth daily.  90 tablet  1  . aspirin EC 325 MG tablet Take 325 mg by mouth daily.        Marland Kitchen atorvastatin (LIPITOR) 40 MG tablet Take 1 tablet (40 mg total) by mouth daily.  90 tablet  1  . carvedilol (COREG) 25 MG tablet Take 1 tablet (25 mg total) by mouth 2 (two) times daily with a meal.  180  tablet  1  . colchicine 0.6 MG tablet Take 0.6 mg by mouth daily as needed. For gout      . levothyroxine (SYNTHROID, LEVOTHROID) 50 MCG tablet Take 1 tablet (50 mcg total) by mouth daily.  90 tablet  1  . losartan (COZAAR) 25 MG tablet Take 1 tablet (25 mg total) by mouth 2 (two) times daily.  180 tablet  1    Physical Examination  Filed Vitals:   12/07/12 1502  BP: 109/59  Pulse: 70  Resp:     Body mass index is 28.80 kg/(m^2).  General:  WDWN in NAD Gait: Normal HEENT: WNL Eyes: Pupils equal Pulmonary: normal non-labored breathing , without Rales, rhonchi,  wheezing Cardiac: RRR, without  Murmurs, rubs or gallops; Abdomen: soft, NT, no masses Skin: no rashes, ulcers noted  Vascular Exam Pulses: Palpable femoral pulses with PT and DP pulse palpable on the right Carotid bruits: Carotid pulses to auscultation no bruits are heard Extremities without ischemic changes, no Gangrene , no cellulitis; no open wounds;  Musculoskeletal: no muscle wasting or atrophy   Neurologic: A&O X 3; Appropriate Affect ; SENSATION: normal; MOTOR FUNCTION:  moving all extremities equally. Speech is fluent/normal  Non-Invasive Vascular Imaging CAROTID DUPLEX 12/07/2012  Right ICA 40 - 59 % stenosis Left ICA 40 - 59 % stenosis ABIs today are 1.05 biphasic on the right, 0.93 and noncompressible left which is stable compared to previous exam in May of 2013, the right SFA stent is patent with some elevation in velocity proximally at 291  ASSESSMENT/PLAN: Asymptomatic carotid patient who is also in PAD surveillance. The patient will followup in 6 months for repeat duplex of his stent and ABIs and in one year for repeat carotid duplex. The patient's questions were encouraged and answered, he is in agreement with this plan.  Lauree Chandler ANP  Clinic MD: Hart Rochester

## 2013-01-11 ENCOUNTER — Encounter: Payer: Self-pay | Admitting: Internal Medicine

## 2013-01-11 ENCOUNTER — Ambulatory Visit (INDEPENDENT_AMBULATORY_CARE_PROVIDER_SITE_OTHER): Payer: Medicare Other | Admitting: Internal Medicine

## 2013-01-11 VITALS — BP 132/68 | HR 76 | Temp 98.5°F | Wt 203.0 lb

## 2013-01-11 DIAGNOSIS — E119 Type 2 diabetes mellitus without complications: Secondary | ICD-10-CM

## 2013-01-11 DIAGNOSIS — I10 Essential (primary) hypertension: Secondary | ICD-10-CM

## 2013-01-11 MED ORDER — AMLODIPINE BESYLATE 10 MG PO TABS: 10.0000 mg | ORAL_TABLET | Freq: Every day | ORAL | Status: DC

## 2013-01-11 MED ORDER — LOSARTAN POTASSIUM 25 MG PO TABS: 25.0000 mg | ORAL_TABLET | Freq: Two times a day (BID) | ORAL | Status: DC

## 2013-01-11 MED ORDER — LEVOTHYROXINE SODIUM 50 MCG PO TABS: 50.0000 ug | ORAL_TABLET | Freq: Every day | ORAL | Status: DC

## 2013-01-11 MED ORDER — CARVEDILOL 25 MG PO TABS: 25.0000 mg | ORAL_TABLET | Freq: Two times a day (BID) | ORAL | Status: DC

## 2013-01-11 MED ORDER — ATORVASTATIN CALCIUM 40 MG PO TABS: 40.0000 mg | ORAL_TABLET | Freq: Every day | ORAL | Status: DC

## 2013-01-11 NOTE — Assessment & Plan Note (Signed)
Diet controlled.  No complications of diabetes. Monitor A1c.

## 2013-01-11 NOTE — Patient Instructions (Signed)
Please complete the following lab tests before your next follow up appointment: BMET - 401.9 A1c - 790.29 FLP, LFTs - 272.4 TSH - 244.9 

## 2013-01-11 NOTE — Progress Notes (Signed)
Subjective:    Patient ID: Fernando Combs, male    DOB: 1945/12/11, 67 y.o.   MRN: [id number]  HPI  67 year old white male with type 2 diabetes, hypertension and peripheral vascular disease for followup. Patient recently seen by his vascular specialist. His lower extremity vascular disease is stable. Followup surveillance studies planned for 6 months.  Hypertension-he recently ran out of losartan. He has noticed more lower extremity edema.  Type II diabetes-weight is stable  Review of Systems Negative for weight gain, orthopnea or shortness of breath.  Past Medical History  Diagnosis Date  . History of myocardial infarction   . Hypertension   . Hyperlipidemia   . CAD (coronary artery disease)   . Diabetic peripheral neuropathy   . Benign prostatic hypertrophy   . Diabetes mellitus type II   . History of tobacco abuse   . History of syncope     most likely vasovagal  . Thrombus 04/2008    acute thrombus of his right superficial artery with claudication  . Diabetes, polyneuropathy   . Colitis, ischemic 06/2005     history of  . DVT (deep venous thrombosis)   . Myocardial infarction   . Peripheral vascular disease   . Carotid artery occlusion     History   Social History  . Marital Status: Married    Spouse Name: N/A    Number of Children: N/A  . Years of Education: N/A   Occupational History  . Not on file.   Social History Main Topics  . Smoking status: Former Smoker -- 15 years    Types: Cigarettes    Quit date: 04/14/2011  . Smokeless tobacco: Never Used  . Alcohol Use: No  . Drug Use: No  . Sexually Active: Not on file   Other Topics Concern  . Not on file   Social History Narrative   Occupation:  Works at [employer] - lives with wife (2nd marriage)   3 children     Alcohol use-no   Tobacco Use - Yes.           Past Surgical History  Procedure Laterality Date  . Other surgical history  11/2010    angiogram, right leg stent placement-  -Dr  Hart Rochester  . Peripheral vascular surgery    . Pr vein bypass graft,aorto-fem-pop  08/04/00  . Coronary artery bypass graft  08/04/00    times 4 (left interanl mammary artery to the left anterior descending, saphenous vein,graft to diagonal, saphenous vein graft to obtuse marginal, spahenous vein graft to posterior descending-Surgeon  Kathlee Nations Tright III  . Lower extremity angiogram  March 09, 2012    Family History  Problem Relation Age of Onset  . Alcohol abuse    . Stroke    . Coronary artery disease    . Hyperlipidemia    . Other Neg Hx     denies family history of blood clots or blood disorder  . Cancer Mother   . Deep vein thrombosis Mother     Varicose Veins  . Heart disease Mother   . Hyperlipidemia Mother   . Hypertension Mother   . Peripheral vascular disease Mother   . Stroke Father   . Deep vein thrombosis Father   . Diabetes Father   . Heart disease Father     Heart Disease  before age 31  . Hyperlipidemia Father   . Hypertension Father   . Heart attack Father   . Peripheral vascular disease  Father   . Deep vein thrombosis Brother   . Diabetes Brother   . Heart disease Brother     Heart Disease before age 46  . Hyperlipidemia Brother   . Hypertension Brother   . Heart attack Brother   . Peripheral vascular disease Brother     No Known Allergies  Current Outpatient Prescriptions on File Prior to Visit  Medication Sig Dispense Refill  . aspirin EC 325 MG tablet Take 325 mg by mouth daily.        . colchicine 0.6 MG tablet Take 0.6 mg by mouth daily as needed. For gout       No current facility-administered medications on file prior to visit.    BP 132/68  Pulse 76  Temp(Src) 98.5 F (36.9 C) (Oral)  Wt 203 lb (92.08 kg)  BMI 29.13 kg/m2       Objective:   Physical Exam  Constitutional: He is oriented to person, place, and time. He appears well-developed and well-nourished.  Cardiovascular: Normal rate, regular rhythm and normal heart sounds.    Pulmonary/Chest: Effort normal and breath sounds normal. He has no wheezes.  Musculoskeletal:  Trace bilateral pitting edema  Neurological: He is alert and oriented to person, place, and time. No cranial nerve deficit.  Psychiatric: He has a normal mood and affect. His behavior is normal.          Assessment & Plan:

## 2013-01-11 NOTE — Assessment & Plan Note (Signed)
Blood pressure improved since increasing losartan to 25 mg twice daily. He has mild lower extremity edema since running out of losartan several days ago. Restart ARB.  Monitor electrolytes and kidney function. BP: 132/68 mmHg

## 2013-05-04 ENCOUNTER — Other Ambulatory Visit (INDEPENDENT_AMBULATORY_CARE_PROVIDER_SITE_OTHER): Payer: Medicare Other

## 2013-05-04 DIAGNOSIS — E785 Hyperlipidemia, unspecified: Secondary | ICD-10-CM

## 2013-05-04 DIAGNOSIS — R7309 Other abnormal glucose: Secondary | ICD-10-CM

## 2013-05-04 DIAGNOSIS — I10 Essential (primary) hypertension: Secondary | ICD-10-CM

## 2013-05-04 LAB — HEPATIC FUNCTION PANEL
ALT: 16 U/L (ref 0–53)
AST: 16 U/L (ref 0–37)
Alkaline Phosphatase: 59 U/L (ref 39–117)
Total Bilirubin: 0.5 mg/dL (ref 0.3–1.2)

## 2013-05-04 LAB — BASIC METABOLIC PANEL
BUN: 23 mg/dL (ref 6–23)
CO2: 25 mEq/L (ref 19–32)
Chloride: 106 mEq/L (ref 96–112)
Creatinine, Ser: 1.5 mg/dL (ref 0.4–1.5)
Potassium: 3.7 mEq/L (ref 3.5–5.1)

## 2013-05-04 LAB — LIPID PANEL
Cholesterol: 122 mg/dL (ref 0–200)
LDL Cholesterol: 53 mg/dL (ref 0–99)
Triglycerides: 165 mg/dL — ABNORMAL HIGH (ref 0.0–149.0)

## 2013-05-04 LAB — TSH: TSH: 6.76 u[IU]/mL — ABNORMAL HIGH (ref 0.35–5.50)

## 2013-05-04 LAB — HEMOGLOBIN A1C: Hgb A1c MFr Bld: 6.2 % (ref 4.6–6.5)

## 2013-05-05 NOTE — Progress Notes (Signed)
He takes synthroid at night and has not m issed any doses

## 2013-05-11 ENCOUNTER — Ambulatory Visit: Payer: Medicare Other | Admitting: Internal Medicine

## 2013-05-12 ENCOUNTER — Ambulatory Visit: Payer: Medicare Other | Admitting: Internal Medicine

## 2013-05-12 ENCOUNTER — Other Ambulatory Visit: Payer: Self-pay | Admitting: Internal Medicine

## 2013-05-12 MED ORDER — CARVEDILOL 25 MG PO TABS: 25.0000 mg | ORAL_TABLET | Freq: Two times a day (BID) | ORAL | Status: DC

## 2013-05-12 MED ORDER — LOSARTAN POTASSIUM 25 MG PO TABS: 25.0000 mg | ORAL_TABLET | Freq: Two times a day (BID) | ORAL | Status: DC

## 2013-05-12 MED ORDER — LEVOTHYROXINE SODIUM 75 MCG PO TABS: 75.0000 ug | ORAL_TABLET | Freq: Every day | ORAL | Status: DC

## 2013-05-12 MED ORDER — AMLODIPINE BESYLATE 10 MG PO TABS: 10.0000 mg | ORAL_TABLET | Freq: Every day | ORAL | Status: DC

## 2013-05-12 MED ORDER — ATORVASTATIN CALCIUM 40 MG PO TABS: 40.0000 mg | ORAL_TABLET | Freq: Every day | ORAL | Status: DC

## 2013-05-20 ENCOUNTER — Encounter: Payer: Self-pay | Admitting: *Deleted

## 2013-05-25 ENCOUNTER — Telehealth: Payer: Self-pay | Admitting: Internal Medicine

## 2013-05-25 MED ORDER — CARVEDILOL 25 MG PO TABS: 25.0000 mg | ORAL_TABLET | Freq: Two times a day (BID) | ORAL | Status: DC

## 2013-05-25 MED ORDER — LOSARTAN POTASSIUM 25 MG PO TABS: 25.0000 mg | ORAL_TABLET | Freq: Two times a day (BID) | ORAL | Status: DC

## 2013-05-25 MED ORDER — ATORVASTATIN CALCIUM 40 MG PO TABS: 40.0000 mg | ORAL_TABLET | Freq: Every day | ORAL | Status: DC

## 2013-05-25 MED ORDER — AMLODIPINE BESYLATE 10 MG PO TABS: 10.0000 mg | ORAL_TABLET | Freq: Every day | ORAL | Status: DC

## 2013-05-25 MED ORDER — LEVOTHYROXINE SODIUM 75 MCG PO TABS: 75.0000 ug | ORAL_TABLET | Freq: Every day | ORAL | Status: DC

## 2013-05-25 NOTE — Telephone Encounter (Signed)
Upon calling to reschedule pt's appt, he informed me that he had only a few weeks supply left of his medication. He will need a 3 month supply of his amLODipine (NORVASC) 10 MG tablet, atorvastatin (LIPITOR) 40 MG tablet, carvedilol (COREG) 25 MG tablet, levothyroxine (SYNTHROID, LEVOTHROID) 75 MCG tablet, and losartan (COZAAR) 25 MG tablet. He would like them called into [facility]. Please assist.

## 2013-05-25 NOTE — Telephone Encounter (Signed)
rx sent in electronically 

## 2013-06-02 ENCOUNTER — Ambulatory Visit: Payer: Medicare Other | Admitting: Internal Medicine

## 2013-06-07 ENCOUNTER — Encounter (INDEPENDENT_AMBULATORY_CARE_PROVIDER_SITE_OTHER): Payer: Medicare Other | Admitting: *Deleted

## 2013-06-07 ENCOUNTER — Ambulatory Visit: Payer: Medicare Other | Admitting: Neurosurgery

## 2013-06-07 DIAGNOSIS — I70219 Atherosclerosis of native arteries of extremities with intermittent claudication, unspecified extremity: Secondary | ICD-10-CM

## 2013-06-07 DIAGNOSIS — I6529 Occlusion and stenosis of unspecified carotid artery: Secondary | ICD-10-CM

## 2013-06-07 DIAGNOSIS — Z48812 Encounter for surgical aftercare following surgery on the circulatory system: Secondary | ICD-10-CM

## 2013-06-07 DIAGNOSIS — I739 Peripheral vascular disease, unspecified: Secondary | ICD-10-CM

## 2013-06-08 ENCOUNTER — Other Ambulatory Visit: Payer: Self-pay | Admitting: *Deleted

## 2013-06-08 DIAGNOSIS — Z48812 Encounter for surgical aftercare following surgery on the circulatory system: Secondary | ICD-10-CM

## 2013-06-08 DIAGNOSIS — I739 Peripheral vascular disease, unspecified: Secondary | ICD-10-CM

## 2013-06-13 ENCOUNTER — Encounter: Payer: Self-pay | Admitting: Vascular Surgery

## 2013-08-01 ENCOUNTER — Encounter: Payer: Self-pay | Admitting: Internal Medicine

## 2013-08-01 ENCOUNTER — Ambulatory Visit (INDEPENDENT_AMBULATORY_CARE_PROVIDER_SITE_OTHER): Payer: Medicare Other | Admitting: Internal Medicine

## 2013-08-01 VITALS — BP 132/70 | HR 72 | Temp 98.4°F | Wt 203.0 lb

## 2013-08-01 DIAGNOSIS — E039 Hypothyroidism, unspecified: Secondary | ICD-10-CM

## 2013-08-01 DIAGNOSIS — E119 Type 2 diabetes mellitus without complications: Secondary | ICD-10-CM

## 2013-08-01 NOTE — Assessment & Plan Note (Signed)
A1c is stable at 6.2. He is managed with diet alone. Continue to monitor A1c. Patient encouraged to follow up with ophthalmologist for yearly eye exam. Diabetic foot exam is normal. Patient will obtain influenza vaccine from his employer. Continue aggressive risk factor management.

## 2013-08-01 NOTE — Patient Instructions (Addendum)
Please follow up with Dr. Nile Riggs for diabetic eye exam Contact our office 2-4 weeks before needing refill on your medications

## 2013-08-01 NOTE — Progress Notes (Signed)
Subjective:    Patient ID: Fernando Combs, male    DOB: September 22, 1946, 67 y.o.   MRN: [id number]  HPI  67 year old white male with history of type 2 diabetes, hypertension and peripheral vascular disease for followup. He denies any significant interval medical history. He is following a low carbohydrate diet and exercising regularly. His A1c is stable at 6.2.  It has been approximately one year since his last diabetic eye exam. No history of diabetic retinopathy. He denies any numbness and tingling in his hands or feet.  Patient continues to stay active and works at [employer].    Review of Systems Negative for chest pain or shortness of breath  Past Medical History  Diagnosis Date  . History of myocardial infarction   . Hypertension   . Hyperlipidemia   . CAD (coronary artery disease)   . Diabetic peripheral neuropathy   . Benign prostatic hypertrophy   . Diabetes mellitus type II   . History of tobacco abuse   . History of syncope     most likely vasovagal  . Thrombus 04/2008    acute thrombus of his right superficial artery with claudication  . Diabetes, polyneuropathy   . Colitis, ischemic 06/2005     history of  . DVT (deep venous thrombosis)   . Myocardial infarction   . Peripheral vascular disease   . Carotid artery occlusion     History   Social History  . Marital Status: Married    Spouse Name: N/A    Number of Children: N/A  . Years of Education: N/A   Occupational History  . Not on file.   Social History Main Topics  . Smoking status: Former Smoker -- 15 years    Types: Cigarettes    Quit date: 04/14/2011  . Smokeless tobacco: Never Used  . Alcohol Use: No  . Drug Use: No  . Sexual Activity: Not on file   Other Topics Concern  . Not on file   Social History Narrative   Occupation:  Works at [employer] - lives with wife (2nd marriage)   3 children     Alcohol use-no   Tobacco Use - Yes.           Past Surgical History   Procedure Laterality Date  . Other surgical history  11/2010    angiogram, right leg stent placement-  -Dr Hart Rochester  . Peripheral vascular surgery    . Pr vein bypass graft,aorto-fem-pop  08/04/00  . Coronary artery bypass graft  08/04/00    times 4 (left interanl mammary artery to the left anterior descending, saphenous vein,graft to diagonal, saphenous vein graft to obtuse marginal, spahenous vein graft to posterior descending-Surgeon  Kathlee Nations Tright III  . Lower extremity angiogram  March 09, 2012    Family History  Problem Relation Age of Onset  . Alcohol abuse    . Stroke    . Coronary artery disease    . Hyperlipidemia    . Other Neg Hx     denies family history of blood clots or blood disorder  . Cancer Mother   . Deep vein thrombosis Mother     Varicose Veins  . Heart disease Mother   . Hyperlipidemia Mother   . Hypertension Mother   . Peripheral vascular disease Mother   . Stroke Father   . Deep vein thrombosis Father   . Diabetes Father   . Heart disease Father     Heart  Disease  before age 73  . Hyperlipidemia Father   . Hypertension Father   . Heart attack Father   . Peripheral vascular disease Father   . Deep vein thrombosis Brother   . Diabetes Brother   . Heart disease Brother     Heart Disease before age 30  . Hyperlipidemia Brother   . Hypertension Brother   . Heart attack Brother   . Peripheral vascular disease Brother     No Known Allergies  Current Outpatient Prescriptions on File Prior to Visit  Medication Sig Dispense Refill  . amLODipine (NORVASC) 10 MG tablet Take 1 tablet (10 mg total) by mouth daily.  90 tablet  1  . aspirin EC 325 MG tablet Take 325 mg by mouth daily.        Marland Kitchen atorvastatin (LIPITOR) 40 MG tablet Take 1 tablet (40 mg total) by mouth daily.  90 tablet  1  . carvedilol (COREG) 25 MG tablet Take 1 tablet (25 mg total) by mouth 2 (two) times daily with a meal.  180 tablet  1  . colchicine 0.6 MG tablet Take 0.6 mg by mouth  daily as needed. For gout      . levothyroxine (SYNTHROID, LEVOTHROID) 75 MCG tablet Take 1 tablet (75 mcg total) by mouth daily.  90 tablet  1  . losartan (COZAAR) 25 MG tablet Take 1 tablet (25 mg total) by mouth 2 (two) times daily.  180 tablet  1   No current facility-administered medications on file prior to visit.    BP 132/70  Pulse 72  Temp(Src) 98.4 F (36.9 C) (Oral)  Wt 203 lb (92.08 kg)  BMI 29.13 kg/m2       Objective:   Physical Exam  Constitutional: He is oriented to person, place, and time. He appears well-developed and well-nourished.  Neck: Neck supple.  No carotid bruit  Cardiovascular: Normal rate, regular rhythm and normal heart sounds.   No murmur heard. Pulmonary/Chest: Effort normal and breath sounds normal. He has no wheezes.  Neurological: He is alert and oriented to person, place, and time. No cranial nerve deficit.  Psychiatric: He has a normal mood and affect. His behavior is normal.          Assessment & Plan:

## 2013-08-01 NOTE — Assessment & Plan Note (Signed)
Patient's last TSH showed patient was under dosed. He reports taking thyroid medication in the evening. He is not taking with any over-the-counter vitamins. Levothyroxine increased to 75 mcg. Monitor thyroid function studies. Adjust dose accordingly.

## 2013-08-04 ENCOUNTER — Other Ambulatory Visit: Payer: Self-pay | Admitting: *Deleted

## 2013-08-04 MED ORDER — LEVOTHYROXINE SODIUM 75 MCG PO TABS: 75.0000 ug | ORAL_TABLET | Freq: Every day | ORAL | Status: DC

## 2013-12-12 ENCOUNTER — Encounter: Payer: Self-pay | Admitting: Family

## 2013-12-13 ENCOUNTER — Encounter: Payer: Self-pay | Admitting: Family

## 2013-12-13 ENCOUNTER — Ambulatory Visit (HOSPITAL_COMMUNITY)
Admission: RE | Admit: 2013-12-13 | Discharge: 2013-12-13 | Disposition: A | Discharge status: Home / Self Care | Payer: Medicare Other | Source: Ambulatory Visit | Attending: Family | Admitting: Family

## 2013-12-13 ENCOUNTER — Ambulatory Visit (INDEPENDENT_AMBULATORY_CARE_PROVIDER_SITE_OTHER): Payer: Medicare Other | Admitting: Family

## 2013-12-13 VITALS — BP 150/81 | HR 65 | Resp 16 | Ht 70.0 in | Wt 200.0 lb

## 2013-12-13 DIAGNOSIS — I658 Occlusion and stenosis of other precerebral arteries: Secondary | ICD-10-CM | POA: Insufficient documentation

## 2013-12-13 DIAGNOSIS — I739 Peripheral vascular disease, unspecified: Secondary | ICD-10-CM

## 2013-12-13 DIAGNOSIS — I6529 Occlusion and stenosis of unspecified carotid artery: Secondary | ICD-10-CM

## 2013-12-13 NOTE — Progress Notes (Signed)
[facility] [place] HISTORY AND PHYSICAL   MRN : [id number]  History of Present Illness:   Fernando Combs is a 68 y.o. male male patient of Dr. Kellie Simmering who underwent arthrectomy and angioplasty of the stent in the right SFA in 17-Mar-2012. The patient is status post right SFA to popliteal thrombectomy in 03/17/2008 with the placement of the right SFA stent in January 2012.  He also has a history of mild carotid artery stenosis. He returns today for carotid artery Duplex. He started smoking after his wife died in 1998/03/17, and suffers stress from his son being a heroin addict. Works full time, walks most of the day, denies claudication symptoms, denies non-healing wounds. Denies ever having a stroke or TIA symptoms.  Pt Diabetic: Yes, states in good control Pt smoker: smoker  (1/2 ppd x 16 yrs)  Current Outpatient Prescriptions  Medication Sig Dispense Refill  . amLODipine (NORVASC) 10 MG tablet Take 1 tablet (10 mg total) by mouth daily.  90 tablet  1  . aspirin EC 325 MG tablet Take 325 mg by mouth daily.        Marland Kitchen atorvastatin (LIPITOR) 40 MG tablet Take 1 tablet (40 mg total) by mouth daily.  90 tablet  1  . carvedilol (COREG) 25 MG tablet Take 1 tablet (25 mg total) by mouth 2 (two) times daily with a meal.  180 tablet  1  . colchicine 0.6 MG tablet Take 0.6 mg by mouth daily as needed. For gout      . levothyroxine (SYNTHROID, LEVOTHROID) 75 MCG tablet Take 1 tablet (75 mcg total) by mouth daily.  90 tablet  1  . losartan (COZAAR) 25 MG tablet Take 1 tablet (25 mg total) by mouth 2 (two) times daily.  180 tablet  1   No current facility-administered medications for this visit.    Pt meds include: Statin :Yes Betablocker: Yes ASA: Yes Other anticoagulants/antiplatelets: no  Past Medical History  Diagnosis Date  . History of myocardial infarction   . Hypertension   . Hyperlipidemia   . CAD (coronary artery disease)   . Diabetic peripheral neuropathy   . Benign  prostatic hypertrophy   . Diabetes mellitus type II   . History of tobacco abuse   . History of syncope     most likely vasovagal  . Thrombus 04/2008    acute thrombus of his right superficial artery with claudication  . Diabetes, polyneuropathy   . Colitis, ischemic 06/2005     history of  . DVT (deep venous thrombosis)   . Myocardial infarction   . Peripheral vascular disease   . Carotid artery occlusion     Past Surgical History  Procedure Laterality Date  . Other surgical history  11/2010    angiogram, right leg stent placement-  -Dr Kellie Simmering  . Peripheral vascular surgery    . Pr vein bypass graft,aorto-fem-pop  08/04/00  . Coronary artery bypass graft  08/04/00    times 4 (left interanl mammary artery to the left anterior descending, saphenous vein,graft to diagonal, saphenous vein graft to obtuse marginal, spahenous vein graft to posterior descending-Surgeon  Tharon Aquas Tright III  . Lower extremity angiogram  March 09, 2012    Social History History  Substance Use Topics  . Smoking status: Former Smoker -- 15 years    Types: Cigarettes    Quit date: 04/14/2011  . Smokeless tobacco: Never Used  . Alcohol Use: No    Family History  Family History  Problem Relation Age of Onset  . Alcohol abuse    . Stroke    . Coronary artery disease    . Hyperlipidemia    . Other Neg Hx     denies family history of blood clots or blood disorder  . Cancer Mother   . Deep vein thrombosis Mother     Varicose Veins  . Heart disease Mother   . Hyperlipidemia Mother   . Hypertension Mother   . Peripheral vascular disease Mother   . Stroke Father   . Deep vein thrombosis Father   . Diabetes Father   . Heart disease Father     Heart Disease  before age 42  . Hyperlipidemia Father   . Hypertension Father   . Heart attack Father   . Peripheral vascular disease Father   . Deep vein thrombosis Brother   . Diabetes Brother   . Heart disease Brother     Heart Disease before age 64  .  Hyperlipidemia Brother   . Hypertension Brother   . Heart attack Brother   . Peripheral vascular disease Brother    No Known Allergies   REVIEW OF SYSTEMS: See HPI for pertinent positives and negatives.  Physical Examination Filed Vitals:   12/13/13 1348 12/13/13 1351  BP: 149/82 150/81  Pulse: 71 65  Resp:  16  Height:  5\' 10"  (1.778 m)  Weight:  200 lb (90.719 kg)  SpO2:  98%   Body mass index is 28.7 kg/(m^2).  General:  WDWN in NAD Gait: Normal HENT: WNL Eyes: Pupils equal Pulmonary: normal non-labored breathing , without Rales, rhonchi,  wheezing Cardiac: RRR, no detected Murmur  Abdomen: soft, NT, no masses Skin: no rashes, ulcers noted;  no Gangrene , no cellulitis; no open wounds;   VASCULAR EXAM  Carotid Bruits Left Right   Negative Negative      Aorta is not palpable. Radial pulses are 2+ and equal.                       VASCULAR EXAM: Extremities without ischemic changes  without Gangrene; without open wounds.                                                                                                          LE Pulses LEFT RIGHT       FEMORAL   palpable   palpable        POPLITEAL  not palpable   not palpable       POSTERIOR TIBIAL   palpable    palpable        DORSALIS PEDIS      ANTERIOR TIBIAL  palpable   palpable    Musculoskeletal: no muscle wasting or atrophy; no edema  Neurologic: A&O X 3; Appropriate Affect ;  SENSATION: normal; MOTOR FUNCTION: 5/5 Symmetric, CN 2-12 intact Speech is fluent/normal   Non-Invasive Vascular Imaging (12/13/2013):   Carotid Duplex:  IMPRESSION Bilateral internal carotid artery stenosis present in the 40%-59% range which may be  underestimated due to calcification present making Doppler interrogation difficult. Left external carotid artery stenosis present.    Compared to the previous exam:  Essentially unchanged since previous study on 12/07/2012.     ASSESSMENT:  Fernando Combs is a 68  y.o. male male patient of Dr. Kellie Simmering who underwent arthrectomy and angioplasty of the stent in the right SFA in April 2013. The patient is status post right SFA to popliteal thrombectomy in 2009 with the placement of the right SFA stent in January 2012.  He also has a history of mild carotid artery stenosis. LE studies were done 6 months ago. Carotid Duplex indicates mild/moderate bilateral ICA stenosis. He has no history of stroke. Unfortunately he continues to smoke and was counseled re this.  PLAN:   Based on today's exam and Duplex results, and after discussing with Dr. Kellie Simmering, advised the pt to return in 1 year for carotid Duplex, ABI's, and RLE arterial Duplex. I discussed in depth with the patient the nature of atherosclerosis, and emphasized the importance of maximal medical management including strict control of blood pressure, blood glucose, and lipid levels, obtaining regular exercise, and cessation of smoking.  The patient is aware that without maximal medical management the underlying atherosclerotic disease process will progress, limiting the benefit of any interventions.  The patient was given information about stroke prevention and what symptoms should prompt the patient to seek immediate medical care. The pt was counseled re smoking cessation.  The patient was given information about PAD including signs, symptoms, treatment, what symptoms should prompt the patient to seek immediate medical care, and risk reduction measures to take.  Thank you for allowing [place] to participate in this patient's care.  Clemon Chambers, RN, MSN, FNP-C [facility] Office: [phone]  Clinic MD: Kellie Simmering 12/13/2013 2:03 PM

## 2013-12-13 NOTE — Patient Instructions (Addendum)
Stroke Prevention Some medical conditions and behaviors are associated with an increased chance of having a stroke. You may prevent a stroke by making healthy choices and managing medical conditions. HOW CAN I REDUCE MY RISK OF HAVING A STROKE?   Stay physically active. Get at least 30 minutes of activity on most or all days.  Do not smoke. It may also be helpful to avoid exposure to secondhand smoke.  Limit alcohol use. Moderate alcohol use is considered to be:  No more than 2 drinks per day for men.  No more than 1 drink per day for nonpregnant women.  Eat healthy foods. This involves  Eating 5 or more servings of fruits and vegetables a day.  Following a diet that addresses high blood pressure (hypertension), high cholesterol, diabetes, or obesity.  Manage your cholesterol levels.  A diet low in saturated fat, trans fat, and cholesterol and high in fiber may control cholesterol levels.  Take any prescribed medicines to control cholesterol as directed by your health care provider.  Manage your diabetes.  A controlled-carbohydrate, controlled-sugar diet is recommended to manage diabetes.  Take any prescribed medicines to control diabetes as directed by your health care provider.  Control your hypertension.  A low-salt (sodium), low-saturated fat, low-trans fat, and low-cholesterol diet is recommended to manage hypertension.  Take any prescribed medicines to control hypertension as directed by your health care provider.  Maintain a healthy weight.  A reduced-calorie, low-sodium, low-saturated fat, low-trans fat, low-cholesterol diet is recommended to manage weight.  Stop drug abuse.  Avoid taking birth control pills.  Talk to your health care provider about the risks of taking birth control pills if you are over 35 years old, smoke, get migraines, or have ever had a blood clot.  Get evaluated for sleep disorders (sleep apnea).  Talk to your health care provider about  getting a sleep evaluation if you snore a lot or have excessive sleepiness.  Take medicines as directed by your health care provider.  For some people, aspirin or blood thinners (anticoagulants) are helpful in reducing the risk of forming abnormal blood clots that can lead to stroke. If you have the irregular heart rhythm of atrial fibrillation, you should be on a blood thinner unless there is a good reason you cannot take them.  Understand all your medicine instructions.  Make sure that other other conditions (such as anemia or atherosclerosis) are addressed. SEEK IMMEDIATE MEDICAL CARE IF:   You have sudden weakness or numbness of the face, arm, or leg, especially on one side of the body.  Your face or eyelid droops to one side.  You have sudden confusion.  You have trouble speaking (aphasia) or understanding.  You have sudden trouble seeing in one or both eyes.  You have sudden trouble walking.  You have dizziness.  You have a loss of balance or coordination.  You have a sudden, severe headache with no known cause.  You have new chest pain or an irregular heartbeat. Any of these symptoms may represent a serious problem that is an emergency. Do not wait to see if the symptoms will go away. Get medical help at once. Call your local emergency services  (911 in [place] Do not drive yourself to the hospital. Document Released: 12/18/2004 Document Revised: 08/31/2013 Document Reviewed: 05/13/2013 ExitCare Patient Information 2014 [employer].   Peripheral Vascular Disease Peripheral Vascular Disease (PVD), also called Peripheral Arterial Disease (PAD), is a circulation problem caused by cholesterol (atherosclerotic plaque) deposits in the   arteries. PVD commonly occurs in the lower extremities (legs) but it can occur in other areas of the body, such as your arms. The cholesterol buildup in the arteries reduces blood flow which can cause pain and other serious problems. The presence  of PVD can place a person at risk for Coronary Artery Disease (CAD).  CAUSES  Causes of PVD can be many. It is usually associated with more than one risk factor such as:   High Cholesterol.  Smoking.  Diabetes.  Lack of exercise or inactivity.  High blood pressure (hypertension).  Obesity.  Family history. SYMPTOMS   When the lower extremities are affected, patients with PVD may experience:  Leg pain with exertion or physical activity. This is called INTERMITTENT CLAUDICATION. This may present as cramping or numbness with physical activity. The location of the pain is associated with the level of blockage. For example, blockage at the abdominal level (distal abdominal aorta) may result in buttock or hip pain. Lower leg arterial blockage may result in calf pain.  As PVD becomes more severe, pain can develop with less physical activity.  In people with severe PVD, leg pain may occur at rest.  Other PVD signs and symptoms:  Leg numbness or weakness.  Coldness in the affected leg or foot, especially when compared to the other leg.  A change in leg color.  Patients with significant PVD are more prone to ulcers or sores on toes, feet or legs. These may take longer to heal or may reoccur. The ulcers or sores can become infected.  If signs and symptoms of PVD are ignored, gangrene may occur. This can result in the loss of toes or loss of an entire limb.  Not all leg pain is related to PVD. Other medical conditions can cause leg pain such as:  Blood clots (embolism) or Deep Vein Thrombosis.  Inflammation of the blood vessels (vasculitis).  Spinal stenosis. DIAGNOSIS  Diagnosis of PVD can involve several different types of tests. These can include:  Pulse Volume Recording Method (PVR). This test is simple, painless and does not involve the use of X-rays. PVR involves measuring and comparing the blood pressure in the arms and legs. An ABI (Ankle-Brachial Index) is calculated.  The normal ratio of blood pressures is 1. As this number becomes smaller, it indicates more severe disease.  < 0.95  indicates significant narrowing in one or more leg vessels.  <0.8 there will usually be pain in the foot, leg or buttock with exercise.  <0.4 will usually have pain in the legs at rest.  <0.25  usually indicates limb threatening PVD.  Doppler detection of pulses in the legs. This test is painless and checks to see if you have a pulses in your legs/feet.  A dye or contrast material (a substance that highlights the blood vessels so they show up on x-ray) may be given to help your caregiver better see the arteries for the following tests. The dye is eliminated from your body by the kidney's. Your caregiver may order blood work to check your kidney function and other laboratory values before the following tests are performed:  Magnetic Resonance Angiography (MRA). An MRA is a picture study of the blood vessels and arteries. The MRA machine uses a large magnet to produce images of the blood vessels.  Computed Tomography Angiography (CTA). A CTA is a specialized x-ray that looks at how the blood flows in your blood vessels. An IV may be inserted into your arm so contrast dye   can be injected.  Angiogram. Is a procedure that uses x-rays to look at your blood vessels. This procedure is minimally invasive, meaning a small incision (cut) is made in your groin. A small tube (catheter) is then inserted into the artery of your groin. The catheter is guided to the blood vessel or artery your caregiver wants to examine. Contrast dye is injected into the catheter. X-rays are then taken of the blood vessel or artery. After the images are obtained, the catheter is taken out. TREATMENT  Treatment of PVD involves many interventions which may include:  Lifestyle changes:  Quitting smoking.  Exercise.  Following a low fat, low cholesterol diet.  Control of diabetes.  Foot care is very  important to the PVD patient. Good foot care can help prevent infection.  Medication:  Cholesterol-lowering medicine.  Blood pressure medicine.  Anti-platelet drugs.  Certain medicines may reduce symptoms of Intermittent Claudication.  Interventional/Surgical options:  Angioplasty. An Angioplasty is a procedure that inflates a balloon in the blocked artery. This opens the blocked artery to improve blood flow.  Stent Implant. A wire mesh tube (stent) is placed in the artery. The stent expands and stays in place, allowing the artery to remain open.  Peripheral Bypass Surgery. This is a surgical procedure that reroutes the blood around a blocked artery to help improve blood flow. This type of procedure may be performed if Angioplasty or stent implants are not an option. SEEK IMMEDIATE MEDICAL CARE IF:   You develop pain or numbness in your arms or legs.  Your arm or leg turns cold, becomes blue in color.  You develop redness, warmth, swelling and pain in your arms or legs. MAKE SURE YOU:   Understand these instructions.  Will watch your condition.  Will get help right away if you are not doing well or get worse. Document Released: 12/18/2004 Document Revised: 02/02/2012 Document Reviewed: 11/14/2008 ExitCare Patient Information 2014 [employer].   Smoking Cessation Quitting smoking is important to your health and has many advantages. However, it is not always easy to quit since nicotine is a very addictive drug. Often times, people try 3 times or more before being able to quit. This document explains the best ways for you to prepare to quit smoking. Quitting takes hard work and a lot of effort, but you can do it. ADVANTAGES OF QUITTING SMOKING  You will live longer, feel better, and live better.  Your body will feel the impact of quitting smoking almost immediately.  Within 20 minutes, blood pressure decreases. Your pulse returns to its normal level.  After 8 hours,  carbon monoxide levels in the blood return to normal. Your oxygen level increases.  After 24 hours, the chance of having a heart attack starts to decrease. Your breath, hair, and body stop smelling like smoke.  After 48 hours, damaged nerve endings begin to recover. Your sense of taste and smell improve.  After 72 hours, the body is virtually free of nicotine. Your bronchial tubes relax and breathing becomes easier.  After 2 to 12 weeks, lungs can hold more air. Exercise becomes easier and circulation improves.  The risk of having a heart attack, stroke, cancer, or lung disease is greatly reduced.  After 1 year, the risk of coronary heart disease is cut in half.  After 5 years, the risk of stroke falls to the same as a nonsmoker.  After 10 years, the risk of lung cancer is cut in half and the risk of other cancers   decreases significantly.  After 15 years, the risk of coronary heart disease drops, usually to the level of a nonsmoker.  If you are pregnant, quitting smoking will improve your chances of having a healthy baby.  The people you live with, especially any children, will be healthier.  You will have extra money to spend on things other than cigarettes. QUESTIONS TO THINK ABOUT BEFORE ATTEMPTING TO QUIT You may want to talk about your answers with your caregiver.  Why do you want to quit?  If you tried to quit in the past, what helped and what did not?  What will be the most difficult situations for you after you quit? How will you plan to handle them?  Who can help you through the tough times? Your family? Friends? A caregiver?  What pleasures do you get from smoking? What ways can you still get pleasure if you quit? Here are some questions to ask your caregiver:  How can you help me to be successful at quitting?  What medicine do you think would be best for me and how should I take it?  What should I do if I need more help?  What is smoking withdrawal like? How  can I get information on withdrawal? GET READY  Set a quit date.  Change your environment by getting rid of all cigarettes, ashtrays, matches, and lighters in your home, car, or work. Do not let people smoke in your home.  Review your past attempts to quit. Think about what worked and what did not. GET SUPPORT AND ENCOURAGEMENT You have a better chance of being successful if you have help. You can get support in many ways.  Tell your family, friends, and co-workers that you are going to quit and need their support. Ask them not to smoke around you.  Get individual, group, or telephone counseling and support. Programs are available at local hospitals and health centers. Call your local health department for information about programs in your area.  Spiritual beliefs and practices may help some smokers quit.  Download a "quit meter" on your computer to keep track of quit statistics, such as how long you have gone without smoking, cigarettes not smoked, and money saved.  Get a self-help book about quitting smoking and staying off of tobacco. LEARN NEW SKILLS AND BEHAVIORS  Distract yourself from urges to smoke. Talk to someone, go for a walk, or occupy your time with a task.  Change your normal routine. Take a different route to work. Drink tea instead of coffee. Eat breakfast in a different place.  Reduce your stress. Take a hot bath, exercise, or read a book.  Plan something enjoyable to do every day. Reward yourself for not smoking.  Explore interactive web-based programs that specialize in helping you quit. GET MEDICINE AND USE IT CORRECTLY Medicines can help you stop smoking and decrease the urge to smoke. Combining medicine with the above behavioral methods and support can greatly increase your chances of successfully quitting smoking.  Nicotine replacement therapy helps deliver nicotine to your body without the negative effects and risks of smoking. Nicotine replacement therapy  includes nicotine gum, lozenges, inhalers, nasal sprays, and skin patches. Some may be available over-the-counter and others require a prescription.  Antidepressant medicine helps people abstain from smoking, but how this works is unknown. This medicine is available by prescription.  Nicotinic receptor partial agonist medicine simulates the effect of nicotine in your brain. This medicine is available by prescription. Ask your caregiver for   advice about which medicines to use and how to use them based on your health history. Your caregiver will tell you what side effects to look out for if you choose to be on a medicine or therapy. Carefully read the information on the package. Do not use any other product containing nicotine while using a nicotine replacement product.  RELAPSE OR DIFFICULT SITUATIONS Most relapses occur within the first 3 months after quitting. Do not be discouraged if you start smoking again. Remember, most people try several times before finally quitting. You may have symptoms of withdrawal because your body is used to nicotine. You may crave cigarettes, be irritable, feel very hungry, cough often, get headaches, or have difficulty concentrating. The withdrawal symptoms are only temporary. They are strongest when you first quit, but they will go away within 10 14 days. To reduce the chances of relapse, try to:  Avoid drinking alcohol. Drinking lowers your chances of successfully quitting.  Reduce the amount of caffeine you consume. Once you quit smoking, the amount of caffeine in your body increases and can give you symptoms, such as a rapid heartbeat, sweating, and anxiety.  Avoid smokers because they can make you want to smoke.  Do not let weight gain distract you. Many smokers will gain weight when they quit, usually less than 10 pounds. Eat a healthy diet and stay active. You can always lose the weight gained after you quit.  Find ways to improve your mood other than  smoking. FOR MORE INFORMATION  www.smokefree.gov  Document Released: 11/04/2001 Document Revised: 05/11/2012 Document Reviewed: 02/19/2012 ExitCare Patient Information 2014 [employer].  

## 2014-01-30 ENCOUNTER — Encounter: Payer: Self-pay | Admitting: Internal Medicine

## 2014-01-30 ENCOUNTER — Ambulatory Visit (INDEPENDENT_AMBULATORY_CARE_PROVIDER_SITE_OTHER): Payer: Medicare Other | Admitting: Internal Medicine

## 2014-01-30 VITALS — BP 144/82 | HR 64 | Temp 98.7°F | Ht 70.0 in | Wt 198.0 lb

## 2014-01-30 DIAGNOSIS — I10 Essential (primary) hypertension: Secondary | ICD-10-CM

## 2014-01-30 DIAGNOSIS — E119 Type 2 diabetes mellitus without complications: Secondary | ICD-10-CM

## 2014-01-30 DIAGNOSIS — E039 Hypothyroidism, unspecified: Secondary | ICD-10-CM

## 2014-01-30 DIAGNOSIS — F4323 Adjustment disorder with mixed anxiety and depressed mood: Secondary | ICD-10-CM

## 2014-01-30 MED ORDER — ATORVASTATIN CALCIUM 40 MG PO TABS: 40.0000 mg | ORAL_TABLET | Freq: Every day | ORAL | Status: DC

## 2014-01-30 MED ORDER — CITALOPRAM HYDROBROMIDE 10 MG PO TABS
10.0000 mg | ORAL_TABLET | Freq: Every day | ORAL | Status: DC
Start: 2014-01-30 — End: 2014-03-03

## 2014-01-30 MED ORDER — AMLODIPINE BESYLATE 10 MG PO TABS: 10.0000 mg | ORAL_TABLET | Freq: Every day | ORAL | Status: DC

## 2014-01-30 MED ORDER — LOSARTAN POTASSIUM 50 MG PO TABS: 50.0000 mg | ORAL_TABLET | Freq: Every day | ORAL | Status: DC

## 2014-01-30 MED ORDER — CARVEDILOL 25 MG PO TABS: 25.0000 mg | ORAL_TABLET | Freq: Two times a day (BID) | ORAL | Status: DC

## 2014-01-30 MED ORDER — LEVOTHYROXINE SODIUM 75 MCG PO TABS: 75.0000 ug | ORAL_TABLET | Freq: Every day | ORAL | Status: DC

## 2014-01-30 NOTE — Progress Notes (Signed)
Pre visit review using our clinic review tool, if applicable. No additional management support is needed unless otherwise documented below in the visit note. 

## 2014-01-30 NOTE — Assessment & Plan Note (Signed)
Patient is under significant stress secondary to his son's heroin addiction.  He has difficulty sleeping.  Trial of low dose citalopram 10 mg.  Patient advised to take 5 mg in the morning for 7 days and titrate to 10 mg. He would like to avoid use of sedatives. Patient will try over-the-counter melatonin 3 mg at bedtime.

## 2014-01-30 NOTE — Assessment & Plan Note (Signed)
Monitor TSH.  Adjust thyroid replacement dose accordingly.

## 2014-01-30 NOTE — Progress Notes (Signed)
Subjective:    Patient ID: Fernando Combs, male    DOB: 22-Jan-1946, 68 y.o.   MRN: [id number]  HPI  68 year old white male with history of coronary artery disease, peripheral vascular disease, hypertension and hyperlipidemia for followup. Since previous visit patient seen by vascular specialist. Patient remains fairly asymptomatic. At the time of his visit with vascular specialist, he was still smoking but he reports he quit smoking 2 months ago.  Hypertension-blood pressure is high normal. Good compliance with medication.  Abnormal glucose-weight is stable. He follows fairly healthy diet.  Adjustment disorder-patient's son is in rehabilitation for heroin addiction. Patient has trouble sleeping and anxiety.  Review of Systems Negative for chest pain, negative for leg pains    Past Medical History  Diagnosis Date  . History of myocardial infarction   . Hypertension   . Hyperlipidemia   . CAD (coronary artery disease)   . Diabetic peripheral neuropathy   . Benign prostatic hypertrophy   . Diabetes mellitus type II   . History of tobacco abuse   . History of syncope     most likely vasovagal  . Thrombus 04/2008    acute thrombus of his right superficial artery with claudication  . Diabetes, polyneuropathy   . Colitis, ischemic 06/2005     history of  . DVT (deep venous thrombosis)   . Myocardial infarction   . Peripheral vascular disease   . Carotid artery occlusion     History   Social History  . Marital Status: Married    Spouse Name: N/A    Number of Children: N/A  . Years of Education: N/A   Occupational History  . Not on file.   Social History Main Topics  . Smoking status: Former Smoker -- 15 years    Types: Cigarettes    Quit date: 04/14/2011  . Smokeless tobacco: Never Used  . Alcohol Use: No  . Drug Use: No  . Sexual Activity: Not on file   Other Topics Concern  . Not on file   Social History Narrative   Occupation:  Works at [employer] -  lives with wife (2nd marriage)   3 children     Alcohol use-no   Tobacco Use - Yes.           Past Surgical History  Procedure Laterality Date  . Other surgical history  11/2010    angiogram, right leg stent placement-  -Dr Kellie Simmering  . Peripheral vascular surgery    . Pr vein bypass graft,aorto-fem-pop  08/04/00  . Coronary artery bypass graft  08/04/00    times 4 (left interanl mammary artery to the left anterior descending, saphenous vein,graft to diagonal, saphenous vein graft to obtuse marginal, spahenous vein graft to posterior descending-Surgeon  Tharon Aquas Tright III  . Lower extremity angiogram  March 09, 2012    Family History  Problem Relation Age of Onset  . Alcohol abuse    . Stroke    . Coronary artery disease    . Hyperlipidemia    . Other Neg Hx     denies family history of blood clots or blood disorder  . Cancer Mother   . Deep vein thrombosis Mother     Varicose Veins  . Heart disease Mother   . Hyperlipidemia Mother   . Hypertension Mother   . Peripheral vascular disease Mother   . Stroke Father   . Deep vein thrombosis Father   . Diabetes Father   .  Heart disease Father     Heart Disease  before age 12  . Hyperlipidemia Father   . Hypertension Father   . Heart attack Father   . Peripheral vascular disease Father   . Deep vein thrombosis Brother   . Diabetes Brother   . Heart disease Brother     Heart Disease before age 86  . Hyperlipidemia Brother   . Hypertension Brother   . Heart attack Brother   . Peripheral vascular disease Brother     No Known Allergies  Current Outpatient Prescriptions on File Prior to Visit  Medication Sig Dispense Refill  . amLODipine (NORVASC) 10 MG tablet Take 1 tablet (10 mg total) by mouth daily.  90 tablet  1  . aspirin EC 325 MG tablet Take 325 mg by mouth daily.        Marland Kitchen atorvastatin (LIPITOR) 40 MG tablet Take 1 tablet (40 mg total) by mouth daily.  90 tablet  1  . carvedilol (COREG) 25 MG tablet Take 1 tablet  (25 mg total) by mouth 2 (two) times daily with a meal.  180 tablet  1  . colchicine 0.6 MG tablet Take 0.6 mg by mouth daily as needed. For gout      . levothyroxine (SYNTHROID, LEVOTHROID) 75 MCG tablet Take 1 tablet (75 mcg total) by mouth daily.  90 tablet  1  . losartan (COZAAR) 25 MG tablet Take 1 tablet (25 mg total) by mouth 2 (two) times daily.  180 tablet  1   No current facility-administered medications on file prior to visit.    BP 144/82  Pulse 64  Temp(Src) 98.7 F (37.1 C) (Oral)  Ht 5\' 10"  (1.778 m)  Wt 198 lb (89.812 kg)  BMI 28.41 kg/m2    Objective:   Physical Exam  Constitutional: He is oriented to person, place, and time. He appears well-developed and well-nourished. No distress.  HENT:  Head: Normocephalic and atraumatic.  Neck: Neck supple.  No carotid bruit  Cardiovascular: Normal rate and regular rhythm.   No murmur heard. Pulmonary/Chest: Effort normal and breath sounds normal. He has no wheezes.  Musculoskeletal: He exhibits no edema.  Neurological: He is alert and oriented to person, place, and time. No cranial nerve deficit.  Skin: Skin is warm and dry.  Psychiatric: He has a normal mood and affect. His behavior is normal.          Assessment & Plan:

## 2014-01-30 NOTE — Assessment & Plan Note (Signed)
Monitor A1c.  Continue dietary management.

## 2014-01-31 ENCOUNTER — Telehealth: Payer: Self-pay | Admitting: Internal Medicine

## 2014-01-31 LAB — BASIC METABOLIC PANEL
BUN: 29 mg/dL — ABNORMAL HIGH (ref 6–23)
CO2: 24 meq/L (ref 19–32)
Calcium: 9.9 mg/dL (ref 8.4–10.5)
Chloride: 104 mEq/L (ref 96–112)
Creatinine, Ser: 1.7 mg/dL — ABNORMAL HIGH (ref 0.4–1.5)
GFR: 43.03 mL/min — ABNORMAL LOW (ref >60.00)
GLUCOSE: 100 mg/dL — AB (ref 70–99)
POTASSIUM: 4.4 meq/L (ref 3.5–5.1)
Sodium: 139 mEq/L (ref 135–145)

## 2014-01-31 LAB — TSH: TSH: 1.36 u[IU]/mL (ref 0.35–5.50)

## 2014-01-31 LAB — HEMOGLOBIN A1C: Hgb A1c MFr Bld: 6.2 % (ref 4.6–6.5)

## 2014-01-31 NOTE — Telephone Encounter (Signed)
Relevant patient education mailed to patient.  

## 2014-03-03 ENCOUNTER — Ambulatory Visit (INDEPENDENT_AMBULATORY_CARE_PROVIDER_SITE_OTHER): Payer: Medicare Other | Admitting: Internal Medicine

## 2014-03-03 ENCOUNTER — Encounter: Payer: Self-pay | Admitting: Internal Medicine

## 2014-03-03 VITALS — BP 142/70 | HR 76 | Temp 97.8°F | Ht 70.0 in | Wt 202.0 lb

## 2014-03-03 DIAGNOSIS — E119 Type 2 diabetes mellitus without complications: Secondary | ICD-10-CM

## 2014-03-03 DIAGNOSIS — N189 Chronic kidney disease, unspecified: Secondary | ICD-10-CM

## 2014-03-03 DIAGNOSIS — F4323 Adjustment disorder with mixed anxiety and depressed mood: Secondary | ICD-10-CM

## 2014-03-03 DIAGNOSIS — M109 Gout, unspecified: Secondary | ICD-10-CM

## 2014-03-03 MED ORDER — SERTRALINE HCL 25 MG PO TABS: 25.0000 mg | ORAL_TABLET | Freq: Every day | ORAL | Status: DC

## 2014-03-03 MED ORDER — ALLOPURINOL 100 MG PO TABS: ORAL_TABLET | ORAL | Status: DC

## 2014-03-03 NOTE — Assessment & Plan Note (Signed)
Patient reports gout flare every 3-4 weeks. Start allopurinol. Patient advised to initially start with 100 mg and titrate to 200 mg in one week. Patient understands to starting allopurinol may precipitate a gout flare. Patient advised start colchicine first for 3-4 days then allopurinol.  Patient also advised to hold atorvastatin while taking colchicine.  Monitor uric acid levels.

## 2014-03-03 NOTE — Assessment & Plan Note (Signed)
Patient could not tolerate citalopram due to rise in BP.  Trial of low-dose sertraline.

## 2014-03-03 NOTE — Patient Instructions (Addendum)
Avoid dehydration Avoid all OTC NSAIDs (Ibuprofen, Motrin, Aleve, etc) Please complete the following lab tests before your next follow up appointment: BMET - 401.9, 585.9 Uric acid level, LFTs - 274.9

## 2014-03-03 NOTE — Assessment & Plan Note (Signed)
His A1c is stable. Continue control with diet and exercise. Patient advised to further decrease his carbohydrate intake. Lab Results  Component Value Date   HGBA1C 6.2 01/30/2014

## 2014-03-03 NOTE — Progress Notes (Signed)
Subjective:    Patient ID: Fernando Combs, male    DOB: 1946-05-21, 68 y.o.   MRN: [id number]  HPI  68 year old white male for followup regarding adjustment disorder with depression/anxiety. Patient's was started on citalopram 10 mg once daily. Patient discontinued medication 3 days after starting due to rise in home blood pressure readings. He did not experience any associated chest pain or headache.  Blood pressure readings have normalized over last 2-3 days.  Prediabetes - stable.  He is following carb modified diet.  Gout - Patient experiencing gout flare 3 weeks. It usually occurs in his left foot.  Review of Systems Negative for fever,  Positive for intermittent left foot pain     Past Medical History  Diagnosis Date  . History of myocardial infarction   . Hypertension   . Hyperlipidemia   . CAD (coronary artery disease)   . Diabetic peripheral neuropathy   . Benign prostatic hypertrophy   . Diabetes mellitus type II   . History of tobacco abuse   . History of syncope     most likely vasovagal  . Thrombus 04/2008    acute thrombus of his right superficial artery with claudication  . Diabetes, polyneuropathy   . Colitis, ischemic 06/2005     history of  . DVT (deep venous thrombosis)   . Myocardial infarction   . Peripheral vascular disease   . Carotid artery occlusion     History   Social History  . Marital Status: Married    Spouse Name: N/A    Number of Children: N/A  . Years of Education: N/A   Occupational History  . Not on file.   Social History Main Topics  . Smoking status: Former Smoker -- 15 years    Types: Cigarettes    Quit date: 04/14/2011  . Smokeless tobacco: Never Used  . Alcohol Use: No  . Drug Use: No  . Sexual Activity: Not on file   Other Topics Concern  . Not on file   Social History Narrative   Occupation:  Works at [employer] - lives with wife (2nd marriage)   3 children     Alcohol use-no   Tobacco Use - Yes.        Past Surgical History  Procedure Laterality Date  . Other surgical history  11/2010    angiogram, right leg stent placement-  -Dr Kellie Simmering  . Peripheral vascular surgery    . Pr vein bypass graft,aorto-fem-pop  08/04/00  . Coronary artery bypass graft  08/04/00    times 4 (left interanl mammary artery to the left anterior descending, saphenous vein,graft to diagonal, saphenous vein graft to obtuse marginal, spahenous vein graft to posterior descending-Surgeon  Tharon Aquas Tright III  . Lower extremity angiogram  March 09, 2012    Family History  Problem Relation Age of Onset  . Alcohol abuse    . Stroke    . Coronary artery disease    . Hyperlipidemia    . Other Neg Hx     denies family history of blood clots or blood disorder  . Cancer Mother   . Deep vein thrombosis Mother     Varicose Veins  . Heart disease Mother   . Hyperlipidemia Mother   . Hypertension Mother   . Peripheral vascular disease Mother   . Stroke Father   . Deep vein thrombosis Father   . Diabetes Father   . Heart disease Father  Heart Disease  before age 51  . Hyperlipidemia Father   . Hypertension Father   . Heart attack Father   . Peripheral vascular disease Father   . Deep vein thrombosis Brother   . Diabetes Brother   . Heart disease Brother     Heart Disease before age 20  . Hyperlipidemia Brother   . Hypertension Brother   . Heart attack Brother   . Peripheral vascular disease Brother     No Known Allergies  Current Outpatient Prescriptions on File Prior to Visit  Medication Sig Dispense Refill  . amLODipine (NORVASC) 10 MG tablet Take 1 tablet (10 mg total) by mouth daily.  10 tablet  0  . aspirin EC 325 MG tablet Take 325 mg by mouth daily.        Marland Kitchen atorvastatin (LIPITOR) 40 MG tablet Take 1 tablet (40 mg total) by mouth daily.  90 tablet  1  . carvedilol (COREG) 25 MG tablet Take 1 tablet (25 mg total) by mouth 2 (two) times daily with a meal.  180 tablet  1  . colchicine 0.6 MG  tablet Take 0.6 mg by mouth daily as needed. For gout      . levothyroxine (SYNTHROID, LEVOTHROID) 75 MCG tablet Take 1 tablet (75 mcg total) by mouth daily.  90 tablet  1  . losartan (COZAAR) 50 MG tablet Take 1 tablet (50 mg total) by mouth daily.  90 tablet  1   No current facility-administered medications on file prior to visit.    BP 142/70  Pulse 76  Temp(Src) 97.8 F (36.6 C) (Oral)  Ht 5\' 10"  (1.778 m)  Wt 202 lb (91.627 kg)  BMI 28.98 kg/m2     Objective:   Physical Exam  Constitutional: He is oriented to person, place, and time. He appears well-developed and well-nourished. No distress.  HENT:  Head: Normocephalic and atraumatic.  Cardiovascular: Normal rate, regular rhythm and normal heart sounds.   Pulmonary/Chest: Effort normal. He has no wheezes.  Musculoskeletal: He exhibits no edema.  Neurological: He is alert and oriented to person, place, and time. No cranial nerve deficit.  Psychiatric: He has a normal mood and affect. His behavior is normal.          Assessment & Plan:

## 2014-03-03 NOTE — Progress Notes (Signed)
Pre visit review using our clinic review tool, if applicable. No additional management support is needed unless otherwise documented below in the visit note. 

## 2014-03-03 NOTE — Assessment & Plan Note (Addendum)
Patient with mild worsening of chronic renal insufficiency. Patient understands to avoid dehydration over-the-counter NSAIDs. Lab Results  Component Value Date   CREATININE 1.7* 01/30/2014

## 2014-03-15 ENCOUNTER — Ambulatory Visit: Payer: Medicare Other | Admitting: Internal Medicine

## 2014-03-20 ENCOUNTER — Telehealth: Payer: Self-pay

## 2014-03-20 NOTE — Telephone Encounter (Signed)
Relevant patient education mailed to patient.  

## 2014-03-24 ENCOUNTER — Other Ambulatory Visit (INDEPENDENT_AMBULATORY_CARE_PROVIDER_SITE_OTHER): Payer: Medicare Other

## 2014-03-24 DIAGNOSIS — N189 Chronic kidney disease, unspecified: Secondary | ICD-10-CM

## 2014-03-24 DIAGNOSIS — M109 Gout, unspecified: Secondary | ICD-10-CM

## 2014-03-24 DIAGNOSIS — I10 Essential (primary) hypertension: Secondary | ICD-10-CM

## 2014-03-24 LAB — HEPATIC FUNCTION PANEL
ALBUMIN: 4.2 g/dL (ref 3.5–5.2)
ALT: 18 U/L (ref 0–53)
AST: 17 U/L (ref 0–37)
Alkaline Phosphatase: 61 U/L (ref 39–117)
BILIRUBIN TOTAL: 0.4 mg/dL (ref 0.3–1.2)
Bilirubin, Direct: 0 mg/dL (ref 0.0–0.3)
Total Protein: 7.7 g/dL (ref 6.0–8.3)

## 2014-03-24 LAB — BASIC METABOLIC PANEL
BUN: 26 mg/dL — ABNORMAL HIGH (ref 6–23)
CALCIUM: 9.7 mg/dL (ref 8.4–10.5)
CHLORIDE: 102 meq/L (ref 96–112)
CO2: 24 mEq/L (ref 19–32)
CREATININE: 1.5 mg/dL (ref 0.4–1.5)
GFR: 48.25 mL/min — AB (ref >60.00)
Glucose, Bld: 105 mg/dL — ABNORMAL HIGH (ref 70–99)
Potassium: 4.2 mEq/L (ref 3.5–5.1)
Sodium: 137 mEq/L (ref 135–145)

## 2014-03-24 LAB — URIC ACID: URIC ACID, SERUM: 5.7 mg/dL (ref 4.0–7.8)

## 2014-03-31 ENCOUNTER — Encounter: Payer: Self-pay | Admitting: Internal Medicine

## 2014-03-31 ENCOUNTER — Ambulatory Visit (INDEPENDENT_AMBULATORY_CARE_PROVIDER_SITE_OTHER): Payer: Medicare Other | Admitting: Internal Medicine

## 2014-03-31 VITALS — BP 122/74 | Temp 98.6°F | Ht 70.0 in | Wt 200.0 lb

## 2014-03-31 DIAGNOSIS — M109 Gout, unspecified: Secondary | ICD-10-CM

## 2014-03-31 DIAGNOSIS — F4323 Adjustment disorder with mixed anxiety and depressed mood: Secondary | ICD-10-CM

## 2014-03-31 MED ORDER — SERTRALINE HCL 25 MG PO TABS: 25.0000 mg | ORAL_TABLET | Freq: Every day | ORAL | Status: DC

## 2014-03-31 MED ORDER — ALLOPURINOL 100 MG PO TABS: 200.0000 mg | ORAL_TABLET | Freq: Every day | ORAL | Status: DC

## 2014-03-31 NOTE — Progress Notes (Signed)
Pre visit review using our clinic review tool, if applicable. No additional management support is needed unless otherwise documented below in the visit note. 

## 2014-03-31 NOTE — Patient Instructions (Addendum)
Please complete the following lab tests before your next follow up appointment: BMET, A1c - 790.29 Uric acid level - 274.9 Restart taking Lipitor and Aspirin Only hold lipitor when you are taking colchicine

## 2014-03-31 NOTE — Progress Notes (Signed)
Subjective:    Patient ID: Fernando Combs, male    DOB: 1946-07-30, 68 y.o.   MRN: [id number]  HPI  69 year old white male for followup regarding gout and adjustment disorder with depressive mood. At last visit we started allopurinol, initially at 100 mg then titrated to 200 mg daily. Patient reports no adverse effects from allopurinol. He has not had any gout flares since starting medication. Patient took colchicine we'll starting allopurinol as directed.  Adjustment disorder with depressed mood-patient reports good response to sertraline 25 mg.  It has significantly helped deal with stress at home.  Review of Systems Negative for chest pain    Past Medical History  Diagnosis Date  . History of myocardial infarction   . Hypertension   . Hyperlipidemia   . CAD (coronary artery disease)   . Diabetic peripheral neuropathy   . Benign prostatic hypertrophy   . Diabetes mellitus type II   . History of tobacco abuse   . History of syncope     most likely vasovagal  . Thrombus 04/2008    acute thrombus of his right superficial artery with claudication  . Diabetes, polyneuropathy   . Colitis, ischemic 06/2005     history of  . DVT (deep venous thrombosis)   . Myocardial infarction   . Peripheral vascular disease   . Carotid artery occlusion     History   Social History  . Marital Status: Married    Spouse Name: N/A    Number of Children: N/A  . Years of Education: N/A   Occupational History  . Not on file.   Social History Main Topics  . Smoking status: Former Smoker -- 15 years    Types: Cigarettes    Quit date: 04/14/2011  . Smokeless tobacco: Never Used  . Alcohol Use: No  . Drug Use: No  . Sexual Activity: Not on file   Other Topics Concern  . Not on file   Social History Narrative   Occupation:  Works at [employer] - lives with wife (2nd marriage)   3 children     Alcohol use-no   Tobacco Use - Yes.           Past Surgical History  Procedure  Laterality Date  . Other surgical history  11/2010    angiogram, right leg stent placement-  -Dr Kellie Simmering  . Peripheral vascular surgery    . Pr vein bypass graft,aorto-fem-pop  08/04/00  . Coronary artery bypass graft  08/04/00    times 4 (left interanl mammary artery to the left anterior descending, saphenous vein,graft to diagonal, saphenous vein graft to obtuse marginal, spahenous vein graft to posterior descending-Surgeon  Tharon Aquas Tright III  . Lower extremity angiogram  March 09, 2012    Family History  Problem Relation Age of Onset  . Alcohol abuse    . Stroke    . Coronary artery disease    . Hyperlipidemia    . Other Neg Hx     denies family history of blood clots or blood disorder  . Cancer Mother   . Deep vein thrombosis Mother     Varicose Veins  . Heart disease Mother   . Hyperlipidemia Mother   . Hypertension Mother   . Peripheral vascular disease Mother   . Stroke Father   . Deep vein thrombosis Father   . Diabetes Father   . Heart disease Father     Heart Disease  before age 44  .  Hyperlipidemia Father   . Hypertension Father   . Heart attack Father   . Peripheral vascular disease Father   . Deep vein thrombosis Brother   . Diabetes Brother   . Heart disease Brother     Heart Disease before age 88  . Hyperlipidemia Brother   . Hypertension Brother   . Heart attack Brother   . Peripheral vascular disease Brother     No Known Allergies  Current Outpatient Prescriptions on File Prior to Visit  Medication Sig Dispense Refill  . amLODipine (NORVASC) 10 MG tablet Take 1 tablet (10 mg total) by mouth daily.  10 tablet  0  . aspirin EC 325 MG tablet Take 325 mg by mouth daily.        Marland Kitchen atorvastatin (LIPITOR) 40 MG tablet Take 1 tablet (40 mg total) by mouth daily.  90 tablet  1  . carvedilol (COREG) 25 MG tablet Take 1 tablet (25 mg total) by mouth 2 (two) times daily with a meal.  180 tablet  1  . colchicine 0.6 MG tablet Take 0.6 mg by mouth daily as needed.  For gout      . levothyroxine (SYNTHROID, LEVOTHROID) 75 MCG tablet Take 1 tablet (75 mcg total) by mouth daily.  90 tablet  1  . losartan (COZAAR) 50 MG tablet Take 1 tablet (50 mg total) by mouth daily.  90 tablet  1   No current facility-administered medications on file prior to visit.    BP 122/74  Temp(Src) 98.6 F (37 C) (Oral)  Ht 5\' 10"  (1.778 m)  Wt 200 lb (90.719 kg)  BMI 28.70 kg/m2    Objective:   Physical Exam  Constitutional: He is oriented to person, place, and time. He appears well-developed and well-nourished. No distress.  Cardiovascular: Normal rate, regular rhythm and normal heart sounds.   Pulmonary/Chest: Effort normal and breath sounds normal. He has no wheezes.  Neurological: He is alert and oriented to person, place, and time. No cranial nerve deficit.  Psychiatric: He has a normal mood and affect. His behavior is normal.          Assessment & Plan:

## 2014-03-31 NOTE — Assessment & Plan Note (Signed)
Good response to allopurinol. Continue 200 mg once daily. His liver function tests are stable. Monitor uric acid level before next office visit.

## 2014-03-31 NOTE — Assessment & Plan Note (Signed)
Improved with sertraline. No side effects noted.  Continue same dose.

## 2014-06-13 ENCOUNTER — Other Ambulatory Visit (HOSPITAL_COMMUNITY): Payer: Medicare Other

## 2014-06-13 ENCOUNTER — Ambulatory Visit: Payer: Medicare Other | Admitting: Family

## 2014-06-13 ENCOUNTER — Encounter (HOSPITAL_COMMUNITY): Payer: Medicare Other

## 2014-06-13 ENCOUNTER — Ambulatory Visit: Payer: Medicare Other | Admitting: Vascular Surgery

## 2014-06-30 ENCOUNTER — Ambulatory Visit (INDEPENDENT_AMBULATORY_CARE_PROVIDER_SITE_OTHER): Payer: Medicare Other | Admitting: Internal Medicine

## 2014-06-30 ENCOUNTER — Encounter: Payer: Self-pay | Admitting: Internal Medicine

## 2014-06-30 VITALS — BP 100/60 | HR 80 | Temp 98.3°F | Ht 70.0 in | Wt 207.0 lb

## 2014-06-30 DIAGNOSIS — E119 Type 2 diabetes mellitus without complications: Secondary | ICD-10-CM

## 2014-06-30 DIAGNOSIS — Z23 Encounter for immunization: Secondary | ICD-10-CM

## 2014-06-30 DIAGNOSIS — I10 Essential (primary) hypertension: Secondary | ICD-10-CM

## 2014-06-30 DIAGNOSIS — M109 Gout, unspecified: Secondary | ICD-10-CM

## 2014-06-30 DIAGNOSIS — E785 Hyperlipidemia, unspecified: Secondary | ICD-10-CM

## 2014-06-30 LAB — BASIC METABOLIC PANEL
BUN: 24 mg/dL — ABNORMAL HIGH (ref 6–23)
CO2: 27 mEq/L (ref 19–32)
Calcium: 9.4 mg/dL (ref 8.4–10.5)
Chloride: 103 mEq/L (ref 96–112)
Creatinine, Ser: 1.7 mg/dL — ABNORMAL HIGH (ref 0.4–1.5)
GFR: 42.4 mL/min — AB (ref >60.00)
Glucose, Bld: 110 mg/dL — ABNORMAL HIGH (ref 70–99)
POTASSIUM: 4.5 meq/L (ref 3.5–5.1)
Sodium: 137 mEq/L (ref 135–145)

## 2014-06-30 LAB — HEMOGLOBIN A1C: HEMOGLOBIN A1C: 6.2 % (ref 4.6–6.5)

## 2014-06-30 LAB — HEPATIC FUNCTION PANEL
ALT: 21 U/L (ref 0–53)
AST: 20 U/L (ref 0–37)
Albumin: 4 g/dL (ref 3.5–5.2)
Alkaline Phosphatase: 68 U/L (ref 39–117)
BILIRUBIN DIRECT: 0 mg/dL (ref 0.0–0.3)
BILIRUBIN TOTAL: 0.5 mg/dL (ref 0.2–1.2)
Total Protein: 7.3 g/dL (ref 6.0–8.3)

## 2014-06-30 LAB — LIPID PANEL
Cholesterol: 102 mg/dL (ref 0–200)
HDL: 32.5 mg/dL — ABNORMAL LOW (ref >39.00)
LDL Cholesterol: 42 mg/dL (ref 0–99)
NonHDL: 69.5
Total CHOL/HDL Ratio: 3
Triglycerides: 138 mg/dL (ref 0.0–149.0)
VLDL: 27.6 mg/dL (ref 0.0–40.0)

## 2014-06-30 LAB — URIC ACID: URIC ACID, SERUM: 5.2 mg/dL (ref 4.0–7.8)

## 2014-06-30 LAB — MICROALBUMIN / CREATININE URINE RATIO
Creatinine,U: 174.8 mg/dL
MICROALB UR: 43.9 mg/dL — AB (ref 0.0–1.9)
MICROALB/CREAT RATIO: 25.1 mg/g (ref 0.0–30.0)

## 2014-06-30 MED ORDER — LEVOTHYROXINE SODIUM 75 MCG PO TABS: 75.0000 ug | ORAL_TABLET | Freq: Every day | ORAL | Status: DC

## 2014-06-30 MED ORDER — ATORVASTATIN CALCIUM 40 MG PO TABS: 40.0000 mg | ORAL_TABLET | Freq: Every day | ORAL | Status: DC

## 2014-06-30 MED ORDER — SERTRALINE HCL 25 MG PO TABS: 25.0000 mg | ORAL_TABLET | Freq: Every day | ORAL | Status: DC

## 2014-06-30 MED ORDER — AMLODIPINE BESYLATE 10 MG PO TABS: 10.0000 mg | ORAL_TABLET | Freq: Every day | ORAL | Status: DC

## 2014-06-30 MED ORDER — CARVEDILOL 25 MG PO TABS: 25.0000 mg | ORAL_TABLET | Freq: Two times a day (BID) | ORAL | Status: DC

## 2014-06-30 MED ORDER — LOSARTAN POTASSIUM 50 MG PO TABS: 50.0000 mg | ORAL_TABLET | Freq: Every day | ORAL | Status: DC

## 2014-06-30 NOTE — Assessment & Plan Note (Signed)
Patient has been asymptomatic since starting allopurinol. Monitor uric acid levels. Monitor LFTs.

## 2014-06-30 NOTE — Assessment & Plan Note (Signed)
Continue same dose of Lipitor. Obtain fasting lipid profile and LFTs. Goal LDL less than 70.

## 2014-06-30 NOTE — Progress Notes (Signed)
Subjective:    Patient ID: Fernando Combs, male    DOB: 12/12/1945, 68 y.o.   MRN: [id number]  HPI  68 year old white male with history of mild type 2 diabetes, gout and hypertension for routine followup.   Type 2 diabetes-patient does not check his blood sugar at home. His A1c's have been fairly stable below 6.5. He manages his blood sugars with diet and exercise.  Hyperlipidemia-good medication compliance. He is due for fasting lipid panel and LFTs.  Hypertension-well-controlled.  Gout-he is tolerating allopurinol. He has not had gout flare since starting allopurinol.   Review of Systems Negative for chest pain.  Negative for shortness of breath.  He is adjusting to being retired.  He would like to resume working part time.    Past Medical History  Diagnosis Date  . History of myocardial infarction   . Hypertension   . Hyperlipidemia   . CAD (coronary artery disease)   . Diabetic peripheral neuropathy   . Benign prostatic hypertrophy   . Diabetes mellitus type II   . History of tobacco abuse   . History of syncope     most likely vasovagal  . Thrombus 04/2008    acute thrombus of his right superficial artery with claudication  . Diabetes, polyneuropathy   . Colitis, ischemic 06/2005     history of  . DVT (deep venous thrombosis)   . Myocardial infarction   . Peripheral vascular disease   . Carotid artery occlusion     History   Social History  . Marital Status: Married    Spouse Name: N/A    Number of Children: N/A  . Years of Education: N/A   Occupational History  . Not on file.   Social History Main Topics  . Smoking status: Former Smoker -- 15 years    Types: Cigarettes    Quit date: 04/14/2011  . Smokeless tobacco: Never Used  . Alcohol Use: No  . Drug Use: No  . Sexual Activity: Not on file   Other Topics Concern  . Not on file   Social History Narrative   Occupation:  Works at [employer] - lives with wife (2nd marriage)   3 children      Alcohol use-no   Tobacco Use - Yes.           Past Surgical History  Procedure Laterality Date  . Other surgical history  11/2010    angiogram, right leg stent placement-  -Dr Kellie Simmering  . Peripheral vascular surgery    . Pr vein bypass graft,aorto-fem-pop  08/04/00  . Coronary artery bypass graft  08/04/00    times 4 (left interanl mammary artery to the left anterior descending, saphenous vein,graft to diagonal, saphenous vein graft to obtuse marginal, spahenous vein graft to posterior descending-Surgeon  Tharon Aquas Tright III  . Lower extremity angiogram  March 09, 2012    Family History  Problem Relation Age of Onset  . Alcohol abuse    . Stroke    . Coronary artery disease    . Hyperlipidemia    . Other Neg Hx     denies family history of blood clots or blood disorder  . Cancer Mother   . Deep vein thrombosis Mother     Varicose Veins  . Heart disease Mother   . Hyperlipidemia Mother   . Hypertension Mother   . Peripheral vascular disease Mother   . Stroke Father   . Deep vein thrombosis Father   .  Diabetes Father   . Heart disease Father     Heart Disease  before age 65  . Hyperlipidemia Father   . Hypertension Father   . Heart attack Father   . Peripheral vascular disease Father   . Deep vein thrombosis Brother   . Diabetes Brother   . Heart disease Brother     Heart Disease before age 77  . Hyperlipidemia Brother   . Hypertension Brother   . Heart attack Brother   . Peripheral vascular disease Brother     No Known Allergies  Current Outpatient Prescriptions on File Prior to Visit  Medication Sig Dispense Refill  . allopurinol (ZYLOPRIM) 100 MG tablet Take 2 tablets (200 mg total) by mouth daily. One tab once daily for 1 week, then 2 tablets once daily  180 tablet  1  . aspirin EC 325 MG tablet Take 325 mg by mouth daily.        . colchicine 0.6 MG tablet Take 0.6 mg by mouth daily as needed. For gout       No current facility-administered medications on  file prior to visit.    BP 100/60  Pulse 80  Temp(Src) 98.3 F (36.8 C) (Oral)  Ht 5\' 10"  (1.778 m)  Wt 207 lb (93.895 kg)  BMI 29.70 kg/m2    Objective:   Physical Exam  Constitutional: He is oriented to person, place, and time. He appears well-developed and well-nourished.  Cardiovascular: Normal rate, regular rhythm and normal heart sounds.   Pulmonary/Chest: Effort normal and breath sounds normal. He has no wheezes.  Musculoskeletal: He exhibits no edema.  Neurological: He is alert and oriented to person, place, and time. No cranial nerve deficit.  Skin: Skin is warm and dry.  Psychiatric: He has a normal mood and affect.       Assessment & Plan:

## 2014-06-30 NOTE — Progress Notes (Signed)
Pre visit review using our clinic review tool, if applicable. No additional management support is needed unless otherwise documented below in the visit note. 

## 2014-06-30 NOTE — Assessment & Plan Note (Addendum)
Continue management with diet and exercise.  Monitor A1c.  Lab Results  Component Value Date   HGBA1C 6.2 01/30/2014   HGBA1C 6.2 05/04/2013   HGBA1C 6.2 11/08/2012   Lab Results  Component Value Date   MICROALBUR 11.2* 12/15/2011   LDLCALC 53 05/04/2013   CREATININE 1.5 03/24/2014

## 2014-08-01 ENCOUNTER — Ambulatory Visit (INDEPENDENT_AMBULATORY_CARE_PROVIDER_SITE_OTHER): Payer: Medicare Other

## 2014-08-01 VITALS — BP 139/73 | HR 66 | Resp 14 | Ht 70.0 in | Wt 205.0 lb

## 2014-08-01 DIAGNOSIS — E1142 Type 2 diabetes mellitus with diabetic polyneuropathy: Secondary | ICD-10-CM

## 2014-08-01 DIAGNOSIS — E1149 Type 2 diabetes mellitus with other diabetic neurological complication: Secondary | ICD-10-CM

## 2014-08-01 DIAGNOSIS — M79609 Pain in unspecified limb: Secondary | ICD-10-CM

## 2014-08-01 DIAGNOSIS — E1140 Type 2 diabetes mellitus with diabetic neuropathy, unspecified: Secondary | ICD-10-CM

## 2014-08-01 DIAGNOSIS — Q828 Other specified congenital malformations of skin: Secondary | ICD-10-CM

## 2014-08-01 DIAGNOSIS — M79673 Pain in unspecified foot: Secondary | ICD-10-CM

## 2014-08-01 DIAGNOSIS — I739 Peripheral vascular disease, unspecified: Secondary | ICD-10-CM

## 2014-08-01 NOTE — Patient Instructions (Signed)
Diabetes and Foot Care Diabetes may cause you to have problems because of poor blood supply (circulation) to your feet and legs. This may cause the skin on your feet to become thinner, break easier, and heal more slowly. Your skin may become dry, and the skin may peel and crack. You may also have nerve damage in your legs and feet causing decreased feeling in them. You may not notice minor injuries to your feet that could lead to infections or more serious problems. Taking care of your feet is one of the most important things you can do for yourself.  HOME CARE INSTRUCTIONS  Wear shoes at all times, even in the house. Do not go barefoot. Bare feet are easily injured.  Check your feet daily for blisters, cuts, and redness. If you cannot see the bottom of your feet, use a mirror or ask someone for help.  Wash your feet with warm water (do not use hot water) and mild soap. Then pat your feet and the areas between your toes until they are completely dry. Do not soak your feet as this can dry your skin.  Apply a moisturizing lotion or petroleum jelly (that does not contain alcohol and is unscented) to the skin on your feet and to dry, brittle toenails. Do not apply lotion between your toes.  Trim your toenails straight across. Do not dig under them or around the cuticle. File the edges of your nails with an emery board or nail file.  Do not cut corns or calluses or try to remove them with medicine.  Wear clean socks or stockings every day. Make sure they are not too tight. Do not wear knee-high stockings since they may decrease blood flow to your legs.  Wear shoes that fit properly and have enough cushioning. To break in new shoes, wear them for just a few hours a day. This prevents you from injuring your feet. Always look in your shoes before you put them on to be sure there are no objects inside.  Do not cross your legs. This may decrease the blood flow to your feet.  If you find a minor scrape,  cut, or break in the skin on your feet, keep it and the skin around it clean and dry. These areas may be cleansed with mild soap and water. Do not cleanse the area with peroxide, alcohol, or iodine.  When you remove an adhesive bandage, be sure not to damage the skin around it.  If you have a wound, look at it several times a day to make sure it is healing.  Do not use heating pads or hot water bottles. They may burn your skin. If you have lost feeling in your feet or legs, you may not know it is happening until it is too late.  Make sure your health care provider performs a complete foot exam at least annually or more often if you have foot problems. Report any cuts, sores, or bruises to your health care provider immediately. SEEK MEDICAL CARE IF:   You have an injury that is not healing.  You have cuts or breaks in the skin.  You have an ingrown nail.  You notice redness on your legs or feet.  You feel burning or tingling in your legs or feet.  You have pain or cramps in your legs and feet.  Your legs or feet are numb.  Your feet always feel cold. SEEK IMMEDIATE MEDICAL CARE IF:   There is increasing redness,   swelling, or pain in or around a wound.  There is a red line that goes up your leg.  Pus is coming from a wound.  You develop a fever or as directed by your health care provider.  You notice a bad smell coming from an ulcer or wound. Document Released: 11/07/2000 Document Revised: 07/13/2013 Document Reviewed: 04/19/2013 [facility] Patient Information 2015 [place], [place]. This information is not intended to replace advice given to you by your health care provider. Make sure you discuss any questions you have with your health care provider.   Recommendation at this time for the painful skin lesion of your right foot is to followup with diabetic extra-depth shoes and custom molded memory foam insoles. The Medicare program allows for 1 pair shoes and 3 pairs of insoles per  year. This will not change your foot structure however, the structure and help prevent future complication

## 2014-08-01 NOTE — Progress Notes (Signed)
   Subjective:    Patient ID: NARADA UZZLE, male    DOB: 06/28/1946, 68 y.o.   MRN: [id number]  HPI Comments: N callous L right plantar sub 4th MPJ D years O on and off C hard painful skin A diabetes, walking and difficult to care for T home shaving, and Dr. Shawna Orleans referred  Pt request diabetic foot exam.  Diabetes      Review of Systems  Cardiovascular: Positive for leg swelling.  All other systems reviewed and are negative.      Objective:   Physical Exam 68 year old white male well-developed well-nourished oriented x3. Patient presents at this time with a complaint of foot pain with keratotic lesion sub-fourth MTP area right foot presents on referral from his primary physician Dr. Shawna Orleans. Patient is requesting diabetic foot evaluation exam and treatment for the skin lesion.  Lower extremity objective findings as follows vascular status appears to be intact with pedal pulses palpable DP and PT plus one over 4 bilateral. Pulses are palpable temperature is cool turgor diminished patient does have a history of vascular compromise has had status on his right legs and balloon angioplasty on left leg also has had 4 episodes of DVT on his right leg. 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 There is no significant edema incision for the medial right leg noted nail somewhat criptotic incurvated friable neurologically epicritic sensations intact although diminished on Semmes Weinstein to the forefoot digits all foot inferior heel area bilateral and first MTP area bilateral does have sensation the digits dorsal foot and ankle area. DTRs noted. There is normal plantar response bilateral. Orthopedic exam reveals rectus foot type semirigid digital contractures 234 and 514 most contracted with a plantigrade fourth metatarsal. There is atrophy of fat pad with associated keratoses either porokeratosis or  verrucoid lesion sub-fourth right is noted. Patient also some pinch calluses first MTP area bilateral       Assessment & Plan:  Assessments at this time patient does have diabetes with history peripheral neuropathy decreased sensations are confirmed patient also has multiple areas of keratoses sub-fourth right pinch callus of hallux bilateral secondary digital contractures and plantigrade metatarsals. These keratoses or potential pre-ulcerative areas as patient does have profound neuropathy as well as a history of PAD and angiopathy. At this keratotic lesions are debrided particular sub-fourth right recommended accommodative shoes reappointed for followup in the future as needed for palliative care however recommendation with the patient would benefit from diabetic extra-depth shoes and Plastizote molded inlays. This would prevent a keratoses and prevent future ulceration and other complications. We'll obtain authorization from Dr. Shawna Orleans for her diabetic shoes and arrange for followup with patient with the next several weeks for measurements and fitting  Harriet Masson DPM

## 2014-09-01 ENCOUNTER — Ambulatory Visit: Payer: Medicare Other | Admitting: Internal Medicine

## 2014-09-01 ENCOUNTER — Ambulatory Visit (INDEPENDENT_AMBULATORY_CARE_PROVIDER_SITE_OTHER): Payer: Medicare Other

## 2014-09-01 DIAGNOSIS — Z23 Encounter for immunization: Secondary | ICD-10-CM

## 2014-09-20 ENCOUNTER — Telehealth: Payer: Self-pay

## 2014-09-20 NOTE — Telephone Encounter (Signed)
LVM for pt to call back.   RE: schedule AWV with PA or NP if pt allows.

## 2014-09-25 ENCOUNTER — Telehealth: Payer: Self-pay | Admitting: Internal Medicine

## 2014-09-25 NOTE — Telephone Encounter (Signed)
FYI

## 2014-09-25 NOTE — Telephone Encounter (Signed)
Pt was one of the pt contacted to have a wellness exam this year bc he had not had one.(per [facility])  Scheduled pt 12/28.  Pt has a fup on 11/16 , cancelled that appt and made a cpe.

## 2014-10-09 ENCOUNTER — Ambulatory Visit: Payer: Medicare Other | Admitting: Internal Medicine

## 2014-11-02 ENCOUNTER — Encounter (HOSPITAL_COMMUNITY): Payer: Self-pay | Admitting: Surgery

## 2014-11-05 ENCOUNTER — Other Ambulatory Visit: Payer: Self-pay | Admitting: Internal Medicine

## 2014-11-13 ENCOUNTER — Other Ambulatory Visit (INDEPENDENT_AMBULATORY_CARE_PROVIDER_SITE_OTHER): Payer: Medicare Other

## 2014-11-13 ENCOUNTER — Other Ambulatory Visit: Payer: Medicare Other

## 2014-11-13 DIAGNOSIS — N289 Disorder of kidney and ureter, unspecified: Secondary | ICD-10-CM

## 2014-11-13 LAB — BASIC METABOLIC PANEL
BUN: 27 mg/dL — ABNORMAL HIGH (ref 6–23)
CALCIUM: 9.3 mg/dL (ref 8.4–10.5)
CHLORIDE: 103 meq/L (ref 96–112)
CO2: 23 meq/L (ref 19–32)
CREATININE: 1.5 mg/dL (ref 0.4–1.5)
GFR: 47.8 mL/min — ABNORMAL LOW (ref >60.00)
GLUCOSE: 125 mg/dL — AB (ref 70–99)
Potassium: 3.9 mEq/L (ref 3.5–5.1)
SODIUM: 135 meq/L (ref 135–145)

## 2014-11-20 ENCOUNTER — Encounter: Payer: Medicare Other | Admitting: Internal Medicine

## 2014-11-23 ENCOUNTER — Other Ambulatory Visit: Payer: Self-pay | Admitting: Internal Medicine

## 2014-12-11 ENCOUNTER — Encounter: Payer: Self-pay | Admitting: Family

## 2014-12-12 ENCOUNTER — Other Ambulatory Visit: Payer: Self-pay | Admitting: Family

## 2014-12-12 ENCOUNTER — Ambulatory Visit (INDEPENDENT_AMBULATORY_CARE_PROVIDER_SITE_OTHER)
Admission: RE | Admit: 2014-12-12 | Discharge: 2014-12-12 | Disposition: A | Discharge status: Home / Self Care | Payer: Medicare Other | Source: Ambulatory Visit | Attending: Family | Admitting: Family

## 2014-12-12 ENCOUNTER — Ambulatory Visit (INDEPENDENT_AMBULATORY_CARE_PROVIDER_SITE_OTHER): Payer: Medicare Other | Admitting: Family

## 2014-12-12 ENCOUNTER — Ambulatory Visit (HOSPITAL_COMMUNITY)
Admission: RE | Admit: 2014-12-12 | Discharge: 2014-12-12 | Disposition: A | Discharge status: Home / Self Care | Payer: Medicare Other | Source: Ambulatory Visit | Attending: Family | Admitting: Family

## 2014-12-12 ENCOUNTER — Encounter: Payer: Self-pay | Admitting: Family

## 2014-12-12 VITALS — BP 116/73 | HR 70 | Resp 16 | Ht 70.0 in | Wt 207.0 lb

## 2014-12-12 DIAGNOSIS — I739 Peripheral vascular disease, unspecified: Secondary | ICD-10-CM

## 2014-12-12 DIAGNOSIS — Z87891 Personal history of nicotine dependence: Secondary | ICD-10-CM

## 2014-12-12 DIAGNOSIS — Z48812 Encounter for surgical aftercare following surgery on the circulatory system: Secondary | ICD-10-CM

## 2014-12-12 DIAGNOSIS — I6523 Occlusion and stenosis of bilateral carotid arteries: Secondary | ICD-10-CM

## 2014-12-12 DIAGNOSIS — Z9889 Other specified postprocedural states: Secondary | ICD-10-CM

## 2014-12-12 DIAGNOSIS — Z95820 Peripheral vascular angioplasty status with implants and grafts: Secondary | ICD-10-CM

## 2014-12-12 NOTE — Patient Instructions (Signed)

## 2014-12-12 NOTE — Progress Notes (Signed)
[facility] [place] HISTORY AND PHYSICAL   MRN : [id number]  History of Present Illness:   Fernando Combs is a 69 y.o. male patient of Dr. Kellie Simmering who underwent atherectomy and angioplasty of the stent in the right SFA in April 2013. The patient is status post right SFA to popliteal thrombectomy in 2009 with the placement of the right SFA stent in January 2012.  He also has a history of mild carotid artery stenosis. He returns today for carotid artery Duplex.  He retired in 2015, is walking less, denies claudication symptoms, denies non-healing wounds. Denies ever having a stroke or TIA symptoms. He denies tingling, numbness, pain, or weakness in either UE, denies dizziness.  Pt Diabetic: Yes, states in good control Pt smoker: former smoker, quit in 2015  Pt meds include: Statin :Yes Betablocker: Yes ASA: Yes Other anticoagulants/antiplatelets: no  Current Outpatient Prescriptions  Medication Sig Dispense Refill  . allopurinol (ZYLOPRIM) 100 MG tablet TAKE 1 TABLET BY MOUTH DAILY FOR THE FIRST WEEK, THEN TAKE 2 TABLETS BY MOUTH DAILY. 180 tablet 1  . amLODipine (NORVASC) 10 MG tablet Take 1 tablet by mouth  daily 90 tablet 0  . aspirin EC 325 MG tablet Take 325 mg by mouth daily.      Marland Kitchen atorvastatin (LIPITOR) 40 MG tablet Take 1 tablet by mouth  daily 90 tablet 0  . carvedilol (COREG) 25 MG tablet Take 1 tablet by mouth two  times daily with a meal 180 tablet 0  . colchicine 0.6 MG tablet Take 0.6 mg by mouth daily as needed. For gout    . levothyroxine (SYNTHROID, LEVOTHROID) 75 MCG tablet Take 1 tablet by mouth  daily 90 tablet 0  . losartan (COZAAR) 50 MG tablet Take 1 tablet by mouth  daily 90 tablet 0  . sertraline (ZOLOFT) 25 MG tablet Take 1 tablet by mouth  daily 90 tablet 0   No current facility-administered medications for this visit.    Past Medical History  Diagnosis Date  . History of myocardial infarction   . Hypertension   .  Hyperlipidemia   . CAD (coronary artery disease)   . Diabetic peripheral neuropathy   . Benign prostatic hypertrophy   . Diabetes mellitus type II   . History of tobacco abuse   . History of syncope     most likely vasovagal  . Thrombus 04/2008    acute thrombus of his right superficial artery with claudication  . Diabetes, polyneuropathy   . Colitis, ischemic 06/2005     history of  . DVT (deep venous thrombosis)   . Myocardial infarction   . Peripheral vascular disease   . Carotid artery occlusion     Social History History  Substance Use Topics  . Smoking status: Former Smoker -- 15 years    Types: Cigarettes    Quit date: 04/14/2011  . Smokeless tobacco: Never Used  . Alcohol Use: No    Family History Family History  Problem Relation Age of Onset  . Alcohol abuse    . Stroke    . Coronary artery disease    . Hyperlipidemia    . Other Neg Hx     denies family history of blood clots or blood disorder  . Cancer Mother   . Deep vein thrombosis Mother     Varicose Veins  . Heart disease Mother   . Hyperlipidemia Mother   . Hypertension Mother   . Peripheral vascular disease Mother   .  Stroke Father   . Deep vein thrombosis Father   . Diabetes Father   . Heart disease Father     Heart Disease  before age 5  . Hyperlipidemia Father   . Hypertension Father   . Heart attack Father   . Peripheral vascular disease Father   . Deep vein thrombosis Brother   . Diabetes Brother   . Heart disease Brother     Heart Disease before age 25  . Hyperlipidemia Brother   . Hypertension Brother   . Heart attack Brother   . Peripheral vascular disease Brother     Surgical History Past Surgical History  Procedure Laterality Date  . Other surgical history  11/2010    angiogram, right leg stent placement-  -Dr Kellie Simmering  . Peripheral vascular surgery    . Pr vein bypass graft,aorto-fem-pop  08/04/00  . Coronary artery bypass graft  08/04/00    times 4 (left interanl mammary  artery to the left anterior descending, saphenous vein,graft to diagonal, saphenous vein graft to obtuse marginal, spahenous vein graft to posterior descending-Surgeon  Tharon Aquas Tright III  . Lower extremity angiogram  March 09, 2012  . Lower extremity angiogram Right 03/09/2012    Procedure: LOWER EXTREMITY ANGIOGRAM;  Surgeon: Serafina Mitchell, MD;  Location: [facility] CATH LAB;  Service: Cardiovascular;  Laterality: Right;    No Known Allergies  Current Outpatient Prescriptions  Medication Sig Dispense Refill  . allopurinol (ZYLOPRIM) 100 MG tablet TAKE 1 TABLET BY MOUTH DAILY FOR THE FIRST WEEK, THEN TAKE 2 TABLETS BY MOUTH DAILY. 180 tablet 1  . amLODipine (NORVASC) 10 MG tablet Take 1 tablet by mouth  daily 90 tablet 0  . aspirin EC 325 MG tablet Take 325 mg by mouth daily.      Marland Kitchen atorvastatin (LIPITOR) 40 MG tablet Take 1 tablet by mouth  daily 90 tablet 0  . carvedilol (COREG) 25 MG tablet Take 1 tablet by mouth two  times daily with a meal 180 tablet 0  . colchicine 0.6 MG tablet Take 0.6 mg by mouth daily as needed. For gout    . levothyroxine (SYNTHROID, LEVOTHROID) 75 MCG tablet Take 1 tablet by mouth  daily 90 tablet 0  . losartan (COZAAR) 50 MG tablet Take 1 tablet by mouth  daily 90 tablet 0  . sertraline (ZOLOFT) 25 MG tablet Take 1 tablet by mouth  daily 90 tablet 0   No current facility-administered medications for this visit.     REVIEW OF SYSTEMS: See HPI for pertinent positives and negatives.  Physical Examination Filed Vitals:   12/12/14 1437 12/12/14 1440  BP: 110/64 116/73  Pulse: 66 70  Resp:  16  Height:  5\' 10"  (1.778 m)  Weight:  207 lb (93.895 kg)  SpO2:  99%   Body mass index is 29.7 kg/(m^2).  General: WDWN in NAD Gait: Normal HENT: WNL Eyes: Pupils equal Pulmonary: normal non-labored breathing , without Rales, rhonchi, wheezing Cardiac: RRR, no detected Murmur  Abdomen: soft, NT, no masses Skin: no rashes, ulcers noted; no Gangrene , no  cellulitis; no open wounds;   VASCULAR EXAM  Carotid Bruits Left Right   Negative Negative   Aorta is not palpable. Radial pulses are 2+ and equal.   VASCULAR EXAM: Extremities without ischemic changes  without Gangrene; without open wounds.     LE Pulses LEFT RIGHT   FEMORAL  palpable  palpable    POPLITEAL not palpable  not palpable   POSTERIOR  TIBIAL  2+palpable  not palpable    DORSALIS PEDIS  ANTERIOR TIBIAL not palpable  1+palpable    Musculoskeletal: no muscle wasting or atrophy; no peripheral edema Neurologic: A&O X 3; Appropriate Affect ;  SENSATION: normal; MOTOR FUNCTION: 5/5 Symmetric, CN 2-12 intact Speech is fluent/normal    Non-Invasive Vascular Imaging (12/12/2014):   CEREBROVASCULAR DUPLEX EVALUATION    INDICATION: Carotid artery stenosis    PREVIOUS INTERVENTION(S): NA    DUPLEX EXAM:     RIGHT  LEFT  Peak Systolic Velocities (cm/s) End Diastolic Velocities (cm/s) Plaque LOCATION Peak Systolic Velocities (cm/s) End Diastolic Velocities (cm/s) Plaque  110 15  CCA PROXIMAL 110 13   80 13 HT CCA MID 89 14   102 15 CP CCA DISTAL 84 15 HT  128 14  ECA 179 17 CP  167 40 CP ICA PROXIMAL 156 31 CP  91 18  ICA MID 120 18   55 15  ICA DISTAL 42 9     2.1 ICA / CCA Ratio (PSV) 1.8  Antegrade Vertebral Flow Antegrade  497 Brachial Systolic Pressure (mmHg) 026  Triphasic Brachial Artery Waveforms Triphasic    Plaque Morphology:  HM = Homogeneous, HT = Heterogeneous, CP = Calcific Plaque, SP = Smooth Plaque, IP = Irregular Plaque     ADDITIONAL FINDINGS:     IMPRESSION: Right internal carotid artery stenosis present in the 40%-59% range. Left internal carotid artery stenosis present in the less than 40% range, unable to  replicate previously more elevated velocities most likely due to vessel tortuousity and calcific plaque present. Blood pressure gradient present.    Compared to the previous exam:  Essentially unchanged since previous study on 12/13/2013.      LOWER EXTREMITY ARTERIAL EVALUATION    INDICATION: Peripheral vascular disease     PREVIOUS INTERVENTION(S): Right popliteal thrombectomy 2009; Right superficial femoral artery percutaneous transluminal angioplasty w/stent 2012; Right SFA atherectomy with percutaneous transluminal angioplasty and stent April 2013     DUPLEX EXAM:     RIGHT  LEFT   Peak Systolic Velocity (cm/s) Ratio (if abnormal) Waveform  Peak Systolic Velocity (cm/s) Ratio (if abnormal) Waveform  64  B Artery - Proximal to Stent     71  B Stent - Origin     162 2.3 B Stent - Proximal     144  B Stent - Mid     85  B Stent - Distal     260 3.1 B  Stent - End     175/75  B/B Artery - Distal to Stent     0.91/0.80 Today's ABI / TBI 1.00/0.83  1.12/0.75 Previous ABI / TBI (06/07/2013  ) 1.07/0.75    Waveform:    M - Monophasic       B - Biphasic       T - Triphasic  If Ankle Brachial Index (ABI) or Toe Brachial Index (TBI) performed, please see complete report  ADDITIONAL FINDINGS:     IMPRESSION: Elevated velocities present involving the right mid to distal superficial femoral artery stent suggestive of greater than 50%, at the proximal segment and distal anastomosis. Remainder of the right lower extremity is patent. Dampened biphasic right lower extremity phasicity at the inflow segment may be suggestive of a more proximal disease process.    Compared to the previous exam:  Decreased right ankle brachial index however still remains in normal range, and unchanged left ankle brachial index since previous study on 06/07/2013.  ASSESSMENT:  MARWAN LIPE is a 69 y.o. male who is s/p atherectomy and angioplasty of the stent in the right SFA in April 2013, and right  SFA to popliteal thrombectomy in 2009 with the placement of the right SFA stent in January 2012.  He also has a history of mild carotid artery stenosis. He has no claudication symptoms, no tissue loss. He is walking less lately due to colder weather. He has no stroke or TIA history. Today's carotid Duplex reveals 40-59% right ICA stenosis and <40% left ICA stenosis.   Today's right LE arterial Duplex reveals elevated velocities involving the right mid to distal superficial femoral artery stent suggestive of greater than 50% stenosis, at the proximal segment and distal anastomosis. Remainder of the right lower extremity is patent. Dampened biphasic right lower extremity phasicity at the inflow segment may be suggestive of a more proximal disease process. Decreased right ankle brachial index however still remains in normal range, and unchanged left ankle brachial index since previous study on 06/07/2013.  He quit smoking in this last year and was congratulated re this.  Face to face time with patient was 25 minutes. Over 50% of this time was spent on counseling and coordination of care.    PLAN:   Graduated walking program. Based on today's exam and non-invasive vascular lab results, the patient will follow up in 1 year with the following tests: cartotid Duplex, ABI's, and right LE arterial Duplex. I discussed in depth with the patient the nature of atherosclerosis, and emphasized the importance of maximal medical management including strict control of blood pressure, blood glucose, and lipid levels, obtaining regular exercise, and cessation of smoking.  The patient is aware that without maximal medical management the underlying atherosclerotic disease process will progress, limiting the benefit of any interventions.  The patient was given information about stroke prevention and what symptoms should prompt the patient to seek immediate medical care.  The patient was given information about  PAD including signs, symptoms, treatment, what symptoms should prompt the patient to seek immediate medical care, and risk reduction measures to take. Thank you for allowing [place] to participate in this patient's care.  Clemon Chambers, RN, MSN, FNP-C [facility] Office: [phone]  Clinic MD: Kellie Simmering 12/12/2014 2:59 PM

## 2014-12-13 NOTE — Addendum Note (Signed)
Addended by: Mena Goes on: 12/13/2014 12:17 PM   Modules accepted: Orders

## 2015-05-03 ENCOUNTER — Other Ambulatory Visit: Payer: Self-pay | Admitting: Internal Medicine

## 2015-05-15 ENCOUNTER — Encounter (HOSPITAL_COMMUNITY): Payer: Self-pay | Admitting: Emergency Medicine

## 2015-05-15 ENCOUNTER — Inpatient Hospital Stay (HOSPITAL_COMMUNITY)
Admission: EM | Admit: 2015-05-15 | Discharge: 2015-05-18 | DRG: 250 | Disposition: A | Discharge status: Home / Self Care | Payer: Medicare Other | Attending: Cardiovascular Disease | Admitting: Cardiovascular Disease

## 2015-05-15 ENCOUNTER — Encounter (HOSPITAL_COMMUNITY)
Admission: EM | Disposition: A | Discharge status: Home / Self Care | Payer: Medicare Other | Source: Home / Self Care | Attending: Cardiovascular Disease

## 2015-05-15 DIAGNOSIS — N189 Chronic kidney disease, unspecified: Secondary | ICD-10-CM | POA: Diagnosis present

## 2015-05-15 DIAGNOSIS — I214 Non-ST elevation (NSTEMI) myocardial infarction: Secondary | ICD-10-CM | POA: Diagnosis not present

## 2015-05-15 DIAGNOSIS — I2119 ST elevation (STEMI) myocardial infarction involving other coronary artery of inferior wall: Secondary | ICD-10-CM | POA: Diagnosis not present

## 2015-05-15 DIAGNOSIS — Z7982 Long term (current) use of aspirin: Secondary | ICD-10-CM

## 2015-05-15 DIAGNOSIS — T82868A Thrombosis of vascular prosthetic devices, implants and grafts, initial encounter: Secondary | ICD-10-CM | POA: Diagnosis not present

## 2015-05-15 DIAGNOSIS — I2102 ST elevation (STEMI) myocardial infarction involving left anterior descending coronary artery: Secondary | ICD-10-CM

## 2015-05-15 DIAGNOSIS — I2510 Atherosclerotic heart disease of native coronary artery without angina pectoris: Secondary | ICD-10-CM | POA: Diagnosis not present

## 2015-05-15 DIAGNOSIS — E1142 Type 2 diabetes mellitus with diabetic polyneuropathy: Secondary | ICD-10-CM | POA: Diagnosis not present

## 2015-05-15 DIAGNOSIS — Z87891 Personal history of nicotine dependence: Secondary | ICD-10-CM | POA: Diagnosis not present

## 2015-05-15 DIAGNOSIS — E785 Hyperlipidemia, unspecified: Secondary | ICD-10-CM | POA: Diagnosis present

## 2015-05-15 DIAGNOSIS — Z833 Family history of diabetes mellitus: Secondary | ICD-10-CM

## 2015-05-15 DIAGNOSIS — Z8249 Family history of ischemic heart disease and other diseases of the circulatory system: Secondary | ICD-10-CM

## 2015-05-15 DIAGNOSIS — I255 Ischemic cardiomyopathy: Secondary | ICD-10-CM | POA: Diagnosis not present

## 2015-05-15 DIAGNOSIS — I10 Essential (primary) hypertension: Secondary | ICD-10-CM | POA: Diagnosis present

## 2015-05-15 DIAGNOSIS — I213 ST elevation (STEMI) myocardial infarction of unspecified site: Secondary | ICD-10-CM | POA: Diagnosis not present

## 2015-05-15 DIAGNOSIS — E039 Hypothyroidism, unspecified: Secondary | ICD-10-CM | POA: Diagnosis not present

## 2015-05-15 DIAGNOSIS — I129 Hypertensive chronic kidney disease with stage 1 through stage 4 chronic kidney disease, or unspecified chronic kidney disease: Secondary | ICD-10-CM | POA: Diagnosis present

## 2015-05-15 DIAGNOSIS — R079 Chest pain, unspecified: Secondary | ICD-10-CM | POA: Diagnosis not present

## 2015-05-15 DIAGNOSIS — I252 Old myocardial infarction: Secondary | ICD-10-CM | POA: Diagnosis not present

## 2015-05-15 DIAGNOSIS — I2109 ST elevation (STEMI) myocardial infarction involving other coronary artery of anterior wall: Secondary | ICD-10-CM | POA: Diagnosis present

## 2015-05-15 DIAGNOSIS — Z79899 Other long term (current) drug therapy: Secondary | ICD-10-CM

## 2015-05-15 HISTORY — PX: CARDIAC CATHETERIZATION: SHX172

## 2015-05-15 LAB — TROPONIN I: TROPONIN I: 0.04 ng/mL — AB (ref <0.031)

## 2015-05-15 LAB — BASIC METABOLIC PANEL
ANION GAP: 15 (ref 5–15)
BUN: 36 mg/dL — ABNORMAL HIGH (ref 6–20)
CO2: 20 mmol/L — ABNORMAL LOW (ref 22–32)
Calcium: 9.5 mg/dL (ref 8.9–10.3)
Chloride: 103 mmol/L (ref 101–111)
Creatinine, Ser: 1.77 mg/dL — ABNORMAL HIGH (ref 0.61–1.24)
GFR calc Af Amer: 43 mL/min — ABNORMAL LOW (ref >60)
GFR, EST NON AFRICAN AMERICAN: 37 mL/min — AB (ref >60)
Glucose, Bld: 144 mg/dL — ABNORMAL HIGH (ref 65–99)
Potassium: 4.5 mmol/L (ref 3.5–5.1)
SODIUM: 138 mmol/L (ref 135–145)

## 2015-05-15 LAB — POCT I-STAT, CHEM 8
BUN: 38 mg/dL — ABNORMAL HIGH (ref 6–20)
CREATININE: 1.9 mg/dL — AB (ref 0.61–1.24)
Calcium, Ion: 1.09 mmol/L — ABNORMAL LOW (ref 1.13–1.30)
Chloride: 107 mmol/L (ref 101–111)
GLUCOSE: 144 mg/dL — AB (ref 65–99)
HCT: 46 % (ref 39.0–52.0)
HEMOGLOBIN: 15.6 g/dL (ref 13.0–17.0)
Potassium: 4.3 mmol/L (ref 3.5–5.1)
SODIUM: 139 mmol/L (ref 135–145)
TCO2: 21 mmol/L (ref 0–100)

## 2015-05-15 LAB — DIFFERENTIAL
BASOS ABS: 0 10*3/uL (ref 0.0–0.1)
BASOS PCT: 0 % (ref 0–1)
EOS ABS: 0.4 10*3/uL (ref 0.0–0.7)
Eosinophils Relative: 4 % (ref 0–5)
Lymphocytes Relative: 26 % (ref 12–46)
Lymphs Abs: 2.9 10*3/uL (ref 0.7–4.0)
Monocytes Absolute: 0.8 10*3/uL (ref 0.1–1.0)
Monocytes Relative: 7 % (ref 3–12)
NEUTROS PCT: 63 % (ref 43–77)
Neutro Abs: 7 10*3/uL (ref 1.7–7.7)

## 2015-05-15 LAB — PROTIME-INR
INR: 1.01 (ref 0.00–1.49)
Prothrombin Time: 13.5 seconds (ref 11.6–15.2)

## 2015-05-15 LAB — APTT: APTT: 29 s (ref 24–37)

## 2015-05-15 LAB — CBC
HEMATOCRIT: 44.1 % (ref 39.0–52.0)
HEMOGLOBIN: 15.5 g/dL (ref 13.0–17.0)
MCH: 29.7 pg (ref 26.0–34.0)
MCHC: 35.1 g/dL (ref 30.0–36.0)
MCV: 84.5 fL (ref 78.0–100.0)
Platelets: 236 10*3/uL (ref 150–400)
RBC: 5.22 MIL/uL (ref 4.22–5.81)
RDW: 14.7 % (ref 11.5–15.5)
WBC: 11.1 10*3/uL — ABNORMAL HIGH (ref 4.0–10.5)

## 2015-05-15 LAB — POCT I-STAT TROPONIN I: Troponin i, poc: 0.06 ng/mL (ref 0.00–0.08)

## 2015-05-15 SURGERY — LEFT HEART CATH AND CORS/GRAFTS ANGIOGRAPHY
Anesthesia: LOCAL

## 2015-05-15 MED ORDER — BIVALIRUDIN BOLUS VIA INFUSION - CUPID
INTRAVENOUS | Status: DC | PRN
  Administered 2015-05-15: 70.425 mg via INTRAVENOUS

## 2015-05-15 MED ORDER — NITROGLYCERIN IN D5W 200-5 MCG/ML-% IV SOLN
INTRAVENOUS | Status: DC | PRN
Start: 2015-05-15 — End: 2015-05-16
  Administered 2015-05-15: 10 ug/min via INTRAVENOUS

## 2015-05-15 MED ORDER — MIDAZOLAM HCL 2 MG/2ML IJ SOLN
INTRAMUSCULAR | Status: AC
  Filled 2015-05-15: qty 2

## 2015-05-15 MED ORDER — MIDAZOLAM HCL 2 MG/2ML IJ SOLN
INTRAMUSCULAR | Status: DC | PRN
  Administered 2015-05-15 (×2): 1 mg via INTRAVENOUS
  Administered 2015-05-15: 2 mg via INTRAVENOUS
  Administered 2015-05-15: 1 mg via INTRAVENOUS

## 2015-05-15 MED ORDER — METOPROLOL TARTRATE 1 MG/ML IV SOLN
INTRAVENOUS | Status: DC | PRN
  Administered 2015-05-15 (×2): 2.5 mg via INTRAVENOUS

## 2015-05-15 MED ORDER — BIVALIRUDIN 250 MG IV SOLR
INTRAVENOUS | Status: AC
  Filled 2015-05-15: qty 250

## 2015-05-15 MED ORDER — NITROGLYCERIN 1 MG/10 ML FOR IR/CATH LAB
INTRA_ARTERIAL | Status: DC | PRN
  Administered 2015-05-15: 200 ug via INTRACORONARY

## 2015-05-15 MED ORDER — FENTANYL CITRATE (PF) 100 MCG/2ML IJ SOLN
INTRAMUSCULAR | Status: AC
  Filled 2015-05-15: qty 2

## 2015-05-15 MED ORDER — HEPARIN SODIUM (PORCINE) 5000 UNIT/ML IJ SOLN
60.0000 [IU]/kg | Freq: Once | INTRAMUSCULAR | Status: AC
  Administered 2015-05-15: 4000 [IU] via INTRAVENOUS

## 2015-05-15 MED ORDER — SODIUM CHLORIDE 0.9 % IV SOLN
250.0000 mg | INTRAVENOUS | Status: DC | PRN
  Administered 2015-05-15: 1.75 mg/kg/h via INTRAVENOUS

## 2015-05-15 MED ORDER — ASPIRIN 81 MG PO CHEW: 324.0000 mg | CHEWABLE_TABLET | Freq: Once | ORAL | Status: AC

## 2015-05-15 MED ORDER — FENTANYL CITRATE (PF) 100 MCG/2ML IJ SOLN
INTRAMUSCULAR | Status: DC | PRN
  Administered 2015-05-15 – 2015-05-16 (×6): 25 ug via INTRAVENOUS

## 2015-05-15 MED ORDER — HEPARIN (PORCINE) IN NACL 2-0.9 UNIT/ML-% IJ SOLN
INTRAMUSCULAR | Status: AC
  Filled 2015-05-15: qty 1500

## 2015-05-15 MED ORDER — LIDOCAINE HCL (PF) 1 % IJ SOLN
INTRAMUSCULAR | Status: AC
  Filled 2015-05-15: qty 30

## 2015-05-15 MED ORDER — METOPROLOL TARTRATE 1 MG/ML IV SOLN
INTRAVENOUS | Status: AC
  Filled 2015-05-15: qty 5

## 2015-05-15 SURGICAL SUPPLY — 18 items
BALLN EUPHORA RX 2.0X20 (BALLOONS) ×3
BALLN EUPHORA RX 2.5X12 (BALLOONS) ×3
BALLOON EUPHORA RX 2.0X20 (BALLOONS) ×1 IMPLANT
BALLOON EUPHORA RX 2.5X12 (BALLOONS) ×1 IMPLANT
CATH INFINITI 5FR MULTPACK ANG (CATHETERS) ×3 IMPLANT
CATH VISTA GUIDE 6FR XBLAD3.5 (CATHETERS) ×3 IMPLANT
KIT ENCORE 26 ADVANTAGE (KITS) ×3 IMPLANT
KIT HEART LEFT (KITS) ×3 IMPLANT
PACK CARDIAC CATHETERIZATION (CUSTOM PROCEDURE TRAY) ×3 IMPLANT
SHEATH PINNACLE 6F 10CM (SHEATH) ×3 IMPLANT
SYR MEDRAD MARK V 150ML (SYRINGE) ×3 IMPLANT
TRANSDUCER W/STOPCOCK (MISCELLANEOUS) ×3 IMPLANT
TUBING CIL FLEX 10 FLL-RA (TUBING) ×3 IMPLANT
WIRE ASAHI MEDIUM 180CM (WIRE) ×3 IMPLANT
WIRE EMERALD 3MM-J .035X150CM (WIRE) ×3 IMPLANT
WIRE HI TORQ WHISPER MS 190CM (WIRE) ×3 IMPLANT
WIRE LUGE 182CM (WIRE) ×3 IMPLANT
WIRE PT2 MS 185 (WIRE) ×3 IMPLANT

## 2015-05-15 NOTE — ED Notes (Signed)
Per EMS: Pt from home for eval of constant left sided chest pain with radiation to left arm 45 min ago, also reports some sob and also nausea with pain. nad noted at this time.pt given 324 mg of aspirin, 2 nitro with no relief, 2mg  of morphine and 4 mg of zofran. Pt alert and oriented upon arrival.

## 2015-05-15 NOTE — ED Notes (Signed)
Pt transported to cath lab by this RN and cardiology fellow. Report given to cath lab techs.

## 2015-05-15 NOTE — Progress Notes (Signed)
Chaplain responded to code stemi, paged fifteen minutes out. Chaplain introduced herself to pt family in waiting room and later escorted them to cath lab waiting area. Chaplain provided emotional support, and offered prayer. Page chaplain as needed.    05/15/15 2206  Clinical Encounter Type  Visited With Family;Patient  Visit Type Spiritual support  Referral From Nurse  Spiritual Encounters  Spiritual Needs Emotional;Prayer  Stress Factors  Family Stress Factors Lack of knowledge  Advance Directives (For Healthcare)  Does patient have an advance directive? Yes  Type of Advance Directive [place], [place], [place] 05/15/2015 10:42 PM

## 2015-05-15 NOTE — ED Provider Notes (Signed)
CSN: [id number]     Arrival date & time 05/15/15  2140 History   First MD Initiated Contact with Patient 05/15/15 2146     No chief complaint on file.   Chief complaint chest pain (Consider location/radiation/quality/duration/timing/severity/associated sxs/prior Treatment) HPI Level 5 caveat urgency of situation. Patient complains of anterior chest pain nonradiating feels like "heart pain" he's had in the past. Onset 30 minutes prior to coming here. He called 911 and EMS treated patient with aspirin 325 mg Zofran formoterol grams IV subungual nitroglycerin 2 tablets and supplemental oxygen and 4 mg of morphine IV. Presently his pain is moderate. Improved since onset. Associated symptoms include nausea and shortness of breath. No other associated symptoms. Pain was onset at rest. Nothing made pain better or worse. Prehospital 12-lead ECG showed normal sinus rhythm presently 95 bpm with an acute lateral wall myocardial infarction and inferior reciprocal T-wave changes area code STEMI was called in the field Past Medical History  Diagnosis Date  . History of myocardial infarction   . Hypertension   . Hyperlipidemia   . CAD (coronary artery disease)   . Diabetic peripheral neuropathy   . Benign prostatic hypertrophy   . Diabetes mellitus type II   . History of tobacco abuse   . History of syncope     most likely vasovagal  . Thrombus 04/2008    acute thrombus of his right superficial artery with claudication  . Diabetes, polyneuropathy   . Colitis, ischemic 06/2005     history of  . DVT (deep venous thrombosis)   . Myocardial infarction   . Peripheral vascular disease   . Carotid artery occlusion    Past Surgical History  Procedure Laterality Date  . Other surgical history  11/2010    angiogram, right leg stent placement-  -Dr Kellie Simmering  . Peripheral vascular surgery    . Pr vein bypass graft,aorto-fem-pop  08/04/00  . Coronary artery bypass graft  08/04/00    times 4 (left interanl  mammary artery to the left anterior descending, saphenous vein,graft to diagonal, saphenous vein graft to obtuse marginal, spahenous vein graft to posterior descending-Surgeon  Tharon Aquas Tright III  . Lower extremity angiogram  March 09, 2012  . Lower extremity angiogram Right 03/09/2012    Procedure: LOWER EXTREMITY ANGIOGRAM;  Surgeon: Serafina Mitchell, MD;  Location: [facility] CATH LAB;  Service: Cardiovascular;  Laterality: Right;   Family History  Problem Relation Age of Onset  . Alcohol abuse    . Stroke    . Coronary artery disease    . Hyperlipidemia    . Other Neg Hx     denies family history of blood clots or blood disorder  . Cancer Mother   . Deep vein thrombosis Mother     Varicose Veins  . Heart disease Mother   . Hyperlipidemia Mother   . Hypertension Mother   . Peripheral vascular disease Mother   . Stroke Father   . Deep vein thrombosis Father   . Diabetes Father   . Heart disease Father     Heart Disease  before age 53  . Hyperlipidemia Father   . Hypertension Father   . Heart attack Father   . Peripheral vascular disease Father   . Deep vein thrombosis Brother   . Diabetes Brother   . Heart disease Brother     Heart Disease before age 21  . Hyperlipidemia Brother   . Hypertension Brother   . Heart attack Brother   .  Peripheral vascular disease Brother    History  Substance Use Topics  . Smoking status: Former Smoker -- 15 years    Types: Cigarettes    Quit date: 04/14/2011  . Smokeless tobacco: Never Used  . Alcohol Use: No    Review of Systems  Unable to perform ROS: Acuity of condition  Respiratory: Positive for shortness of breath.   Cardiovascular: Positive for chest pain.  Gastrointestinal: Positive for nausea.      Allergies  Review of patient's allergies indicates no known allergies.  Home Medications   Prior to Admission medications   Medication Sig Start Date End Date Taking? Authorizing Provider  allopurinol (ZYLOPRIM) 100 MG tablet  TAKE 1 TABLET BY MOUTH DAILY FOR THE FIRST WEEK, THEN TAKE 2 TABLETS BY MOUTH DAILY. 11/06/14   Doe-Hyun R Shawna Orleans, DO  amLODipine (NORVASC) 10 MG tablet Take 1 tablet by mouth  daily 11/27/14   Doe-Hyun R Shawna Orleans, DO  aspirin EC 325 MG tablet Take 325 mg by mouth daily.      Historical Provider, MD  atorvastatin (LIPITOR) 40 MG tablet Take 1 tablet by mouth  daily 11/27/14   Doe-Hyun R Shawna Orleans, DO  carvedilol (COREG) 25 MG tablet Take 1 tablet by mouth two  times daily with a meal 11/27/14   Doe-Hyun R Shawna Orleans, DO  colchicine 0.6 MG tablet Take 0.6 mg by mouth daily as needed. For gout 05/19/11   Debbrah Alar, NP  levothyroxine (SYNTHROID, LEVOTHROID) 75 MCG tablet Take 1 tablet by mouth  daily 11/27/14   Doe-Hyun R Shawna Orleans, DO  losartan (COZAAR) 50 MG tablet Take 1 tablet by mouth  daily 11/27/14   Doe-Hyun R Shawna Orleans, DO  sertraline (ZOLOFT) 25 MG tablet Take 1 tablet by mouth  daily 11/27/14   Doe-Hyun R Shawna Orleans, DO   There were no vitals taken for this visit. Physical Exam  Constitutional: He appears well-developed and well-nourished.  HENT:  Head: Normocephalic and atraumatic.  Eyes: Conjunctivae are normal. Pupils are equal, round, and reactive to light.  Neck: Neck supple. No tracheal deviation present. No thyromegaly present.  Cardiovascular: Normal rate and regular rhythm.   No murmur heard. Pulmonary/Chest: Effort normal and breath sounds normal.  Abdominal: Soft. Bowel sounds are normal. He exhibits no distension. There is no tenderness.  Musculoskeletal: Normal range of motion. He exhibits no edema or tenderness.  Neurological: He is alert. Coordination normal.  Skin: Skin is warm and dry. No rash noted.  Psychiatric: He has a normal mood and affect.  Nursing note and vitals reviewed.   ED Course  Procedures (including critical care time) Labs Review Labs Reviewed  CBC  DIFFERENTIAL  PROTIME-INR  APTT  BASIC METABOLIC PANEL  I-STAT CHEM 8, ED  I-STAT TROPOININ, ED    Imaging Review No results  found.   EKG Interpretation None     ED ECG REPORT   Date: 05/15/2015  Rate: 95  Rhythm: normal sinus rhythm  QRS Axis: normal  Intervals: normal  ST/T Wave abnormalities: ST elevations laterally  Conduction Disutrbances:none  Narrative Interpretation:   Old EKG Reviewed: Lateral injury pattern use since last tracing  I have personally reviewed the EKG tracing and agree with the computerized printout as noted. Patient received 4000 unit heparin intravenous bolus, my order. MDM   Dr. Ulyses Amor from cardiology service arrived and arrangements for patient to go to cardiac catheterization laboratory  Final diagnoses:  None  Dx  Stemi   CRITICAL CARE Performed by: Orlie Dakin Total critical care  time:  20 minute Critical care time was exclusive of separately billable procedures and treating other patients. Critical care was necessary to treat or prevent imminent or life-threatening deterioration. Critical care was time spent personally by me on the following activities: development of treatment plan with patient and/or surrogate as well as nursing, discussions with consultants, evaluation of patient's response to treatment, examination of patient, obtaining history from patient or surrogate, ordering and performing treatments and interventions, ordering and review of laboratory studies, ordering and review of radiographic studies, pulse oximetry and re-evaluation of patient's condition.  Orlie Dakin, MD 05/15/15 2156

## 2015-05-15 NOTE — H&P (Signed)
PCP:   Drema Pry, DO  Primary cardiologist: Lelon Perla, MD ??? - Last cardiology clinic note is from 2012  Chief Complaint:  Chest pain  HPI:  This is 69 year old Caucasian male with past medical history of hyperlipidemia, hypertension, diabetes mellitus type 2 not on medications, coronary artery disease status post CABG in 2001 with LIMA to LAD, SVG to diagonal, SVG to OM, SVG to RPDA who presented with acute onset of chest pain. Patient reported acute pressure like chest pain with radiation to his left arm 8 out of 10 in severity associate with nausea and shortness of breath that started at 8 PM on 05/15/2015. Patient called EMS and was brought to emergency apartment at [facility]. He received aspirin, 4000 units of heparin, nitroglycerin and 4 mg of morphine in the ED and by EMS. His EKG showed normal sinus rhythm with evidence of ST elevation in V5, V6, I, aVL and reciprocal depressions in III and aVF  Patient was emergently taken to the Cath Lab. Coronary angiography showed patent LIMA, SVG to OM, SVG to RPDA, SVG to diagonal was occluded with severe native D1 disease. LVEDP was noted to be 28 mmHg, LV gram with evidence of antero-lateral wall akinesis, LVEF mildly reduced.  Review of Systems:  12 systems were reviewed and were negative except mentioned in the history of present illness  Past Medical History: Past Medical History  Diagnosis Date  . History of myocardial infarction   . Hypertension   . Hyperlipidemia   . CAD (coronary artery disease)   . Diabetic peripheral neuropathy   . Benign prostatic hypertrophy   . Diabetes mellitus type II   . History of tobacco abuse   . History of syncope     most likely vasovagal  . Thrombus 04/2008    acute thrombus of his right superficial artery with claudication  . Diabetes, polyneuropathy   . Colitis, ischemic 06/2005     history of  . DVT (deep venous thrombosis)   . Myocardial infarction   . Peripheral vascular  disease   . Carotid artery occlusion    Past Surgical History  Procedure Laterality Date  . Other surgical history  11/2010    angiogram, right leg stent placement-  -Dr Kellie Simmering  . Peripheral vascular surgery    . Pr vein bypass graft,aorto-fem-pop  08/04/00  . Coronary artery bypass graft  08/04/00    times 4 (left interanl mammary artery to the left anterior descending, saphenous vein,graft to diagonal, saphenous vein graft to obtuse marginal, spahenous vein graft to posterior descending-Surgeon  Tharon Aquas Tright III  . Lower extremity angiogram  March 09, 2012  . Lower extremity angiogram Right 03/09/2012    Procedure: LOWER EXTREMITY ANGIOGRAM;  Surgeon: Serafina Mitchell, MD;  Location: [facility] CATH LAB;  Service: Cardiovascular;  Laterality: Right;    Medications: Prior to Admission medications   Medication Sig Start Date End Date Taking? Authorizing Provider  allopurinol (ZYLOPRIM) 100 MG tablet TAKE 1 TABLET BY MOUTH DAILY FOR THE FIRST WEEK, THEN TAKE 2 TABLETS BY MOUTH DAILY. 11/06/14   Doe-Hyun R Shawna Orleans, DO  amLODipine (NORVASC) 10 MG tablet Take 1 tablet by mouth  daily 11/27/14   Doe-Hyun R Shawna Orleans, DO  aspirin EC 325 MG tablet Take 325 mg by mouth daily.      Historical Provider, MD  atorvastatin (LIPITOR) 40 MG tablet Take 1 tablet by mouth  daily 11/27/14   Doe-Hyun R Shawna Orleans, DO  carvedilol (COREG) 25  MG tablet Take 1 tablet by mouth two  times daily with a meal 11/27/14   Doe-Hyun R Shawna Orleans, DO  colchicine 0.6 MG tablet Take 0.6 mg by mouth daily as needed. For gout 05/19/11   Debbrah Alar, NP  levothyroxine (SYNTHROID, LEVOTHROID) 75 MCG tablet Take 1 tablet by mouth  daily 11/27/14   Doe-Hyun R Shawna Orleans, DO  losartan (COZAAR) 50 MG tablet Take 1 tablet by mouth  daily 11/27/14   Doe-Hyun R Shawna Orleans, DO  sertraline (ZOLOFT) 25 MG tablet Take 1 tablet by mouth  daily 11/27/14   Doe-Hyun R Shawna Orleans, DO    Allergies:  No Known Allergies  Social History:  reports that he quit smoking about 4 years ago. His smoking  use included Cigarettes. He quit after 15 years of use. He has never used smokeless tobacco. He reports that he does not drink alcohol or use illicit drugs.  Family History: Family History  Problem Relation Age of Onset  . Alcohol abuse    . Stroke    . Coronary artery disease    . Hyperlipidemia    . Other Neg Hx     denies family history of blood clots or blood disorder  . Cancer Mother   . Deep vein thrombosis Mother     Varicose Veins  . Heart disease Mother   . Hyperlipidemia Mother   . Hypertension Mother   . Peripheral vascular disease Mother   . Stroke Father   . Deep vein thrombosis Father   . Diabetes Father   . Heart disease Father     Heart Disease  before age 29  . Hyperlipidemia Father   . Hypertension Father   . Heart attack Father   . Peripheral vascular disease Father   . Deep vein thrombosis Brother   . Diabetes Brother   . Heart disease Brother     Heart Disease before age 45  . Hyperlipidemia Brother   . Hypertension Brother   . Heart attack Brother   . Peripheral vascular disease Brother     PHYSICAL EXAM: Heart rate 85 bpm, SPO2 95% on 2 L of oxygen, blood pressure 160/100 mmHg  General:  Well appearing. No respiratory difficulty HEENT: normal Neck: supple. No lymphadenopathy or thryomegaly appreciated. Cor: PMI nondisplaced. Regular rate & rhythm. No rubs, gallops or murmurs. Lungs: clear Abdomen: soft, nontender, nondistended. No hepatosplenomegaly. No bruits or masses. Good bowel sounds. Extremities: no cyanosis, clubbing, rash, edema Neuro: alert & oriented x 3, cranial nerves grossly intact. moves all 4 extremities w/o difficulty. Affect pleasant.  Labs on Admission:  Labs pending  Assessment/Plan  Acute lateral STEMI Coronary artery disease status post CABG in 2001 Hypertension Diabetes mellitus type 2 Hyperlipidemia hypothyroidism  - Further respiration plans pending - Continue aspirin 81 mg daily - Lipitor 80 mg daily -  Lipid profile routine labs, A1c, TSH - sliding scale insulin - Decide on beta blockers and ARB's depending on the outcome of heart catheterization and patient clinical status once in the intensive care unit  [facility], [place] 05/15/2015, 9:48 PM

## 2015-05-16 ENCOUNTER — Encounter (HOSPITAL_COMMUNITY): Payer: Self-pay | Admitting: Cardiovascular Disease

## 2015-05-16 ENCOUNTER — Inpatient Hospital Stay (HOSPITAL_COMMUNITY): Payer: Medicare Other

## 2015-05-16 DIAGNOSIS — I213 ST elevation (STEMI) myocardial infarction of unspecified site: Secondary | ICD-10-CM

## 2015-05-16 DIAGNOSIS — I10 Essential (primary) hypertension: Secondary | ICD-10-CM

## 2015-05-16 DIAGNOSIS — I2109 ST elevation (STEMI) myocardial infarction involving other coronary artery of anterior wall: Secondary | ICD-10-CM | POA: Diagnosis present

## 2015-05-16 DIAGNOSIS — E785 Hyperlipidemia, unspecified: Secondary | ICD-10-CM

## 2015-05-16 LAB — BASIC METABOLIC PANEL
Anion gap: 11 (ref 5–15)
BUN: 32 mg/dL — AB (ref 6–20)
CALCIUM: 8.7 mg/dL — AB (ref 8.9–10.3)
CO2: 23 mmol/L (ref 22–32)
Chloride: 101 mmol/L (ref 101–111)
Creatinine, Ser: 1.6 mg/dL — ABNORMAL HIGH (ref 0.61–1.24)
GFR calc Af Amer: 49 mL/min — ABNORMAL LOW (ref >60)
GFR calc non Af Amer: 42 mL/min — ABNORMAL LOW (ref >60)
GLUCOSE: 234 mg/dL — AB (ref 65–99)
Potassium: 4 mmol/L (ref 3.5–5.1)
SODIUM: 135 mmol/L (ref 135–145)

## 2015-05-16 LAB — GLUCOSE, CAPILLARY
GLUCOSE-CAPILLARY: 180 mg/dL — AB (ref 65–99)
GLUCOSE-CAPILLARY: 152 mg/dL — AB (ref 65–99)
Glucose-Capillary: 181 mg/dL — ABNORMAL HIGH (ref 65–99)
Glucose-Capillary: 154 mg/dL — ABNORMAL HIGH (ref 65–99)
Glucose-Capillary: 130 mg/dL — ABNORMAL HIGH (ref 65–99)

## 2015-05-16 LAB — LIPID PANEL
Cholesterol: 109 mg/dL (ref 0–200)
HDL: 35 mg/dL — ABNORMAL LOW (ref >40)
LDL Cholesterol: 48 mg/dL (ref 0–99)
Total CHOL/HDL Ratio: 3.1 RATIO
Triglycerides: 128 mg/dL (ref <150)
VLDL: 26 mg/dL (ref 0–40)

## 2015-05-16 LAB — CBC
HCT: 41.1 % (ref 39.0–52.0)
Hemoglobin: 14.5 g/dL (ref 13.0–17.0)
MCH: 29.4 pg (ref 26.0–34.0)
MCHC: 35.3 g/dL (ref 30.0–36.0)
MCV: 83.4 fL (ref 78.0–100.0)
PLATELETS: 235 10*3/uL (ref 150–400)
RBC: 4.93 MIL/uL (ref 4.22–5.81)
RDW: 14.6 % (ref 11.5–15.5)
WBC: 12.5 10*3/uL — ABNORMAL HIGH (ref 4.0–10.5)

## 2015-05-16 LAB — MAGNESIUM: Magnesium: 1.9 mg/dL (ref 1.7–2.4)

## 2015-05-16 LAB — TROPONIN I: Troponin I: 3.67 ng/mL (ref <0.031)

## 2015-05-16 LAB — POCT ACTIVATED CLOTTING TIME
ACTIVATED CLOTTING TIME: 171 s
Activated Clotting Time: 552 seconds

## 2015-05-16 LAB — MRSA PCR SCREENING: MRSA by PCR: NEGATIVE

## 2015-05-16 LAB — TSH: TSH: 5.493 u[IU]/mL — ABNORMAL HIGH (ref 0.350–4.500)

## 2015-05-16 MED ORDER — ACETAMINOPHEN 325 MG PO TABS
650.0000 mg | ORAL_TABLET | ORAL | Status: DC | PRN
  Administered 2015-05-17 (×2): 650 mg via ORAL
  Filled 2015-05-16 (×2): qty 2

## 2015-05-16 MED ORDER — TICAGRELOR 90 MG PO TABS
90.0000 mg | ORAL_TABLET | Freq: Two times a day (BID) | ORAL | Status: DC
  Administered 2015-05-16 – 2015-05-18 (×5): 90 mg via ORAL
  Filled 2015-05-16 (×6): qty 1

## 2015-05-16 MED ORDER — PERFLUTREN LIPID MICROSPHERE
1.0000 mL | INTRAVENOUS | Status: AC | PRN
  Administered 2015-05-16: 2 mL via INTRAVENOUS
  Filled 2015-05-16: qty 10

## 2015-05-16 MED ORDER — INSULIN ASPART 100 UNIT/ML ~~LOC~~ SOLN: 0.0000 [IU] | Freq: Every day | SUBCUTANEOUS | Status: DC

## 2015-05-16 MED ORDER — NITROGLYCERIN 0.4 MG SL SUBL: 0.4000 mg | SUBLINGUAL_TABLET | SUBLINGUAL | Status: DC | PRN

## 2015-05-16 MED ORDER — ALUM & MAG HYDROXIDE-SIMETH 200-200-20 MG/5ML PO SUSP
15.0000 mL | ORAL | Status: DC | PRN
  Administered 2015-05-16: 15 mL via ORAL
  Filled 2015-05-16: qty 30

## 2015-05-16 MED ORDER — ATORVASTATIN CALCIUM 80 MG PO TABS
80.0000 mg | ORAL_TABLET | Freq: Every day | ORAL | Status: DC
  Administered 2015-05-16 – 2015-05-17 (×2): 80 mg via ORAL
  Filled 2015-05-16 (×3): qty 1

## 2015-05-16 MED ORDER — INSULIN ASPART 100 UNIT/ML ~~LOC~~ SOLN
0.0000 [IU] | Freq: Three times a day (TID) | SUBCUTANEOUS | Status: DC
  Administered 2015-05-16 – 2015-05-17 (×5): 2 [IU] via SUBCUTANEOUS

## 2015-05-16 MED ORDER — MORPHINE SULFATE 2 MG/ML IJ SOLN
INTRAMUSCULAR | Status: AC
  Filled 2015-05-16: qty 1

## 2015-05-16 MED ORDER — HEPARIN SODIUM (PORCINE) 5000 UNIT/ML IJ SOLN
5000.0000 [IU] | Freq: Three times a day (TID) | INTRAMUSCULAR | Status: DC
  Administered 2015-05-16 – 2015-05-18 (×7): 5000 [IU] via SUBCUTANEOUS
  Filled 2015-05-16 (×10): qty 1

## 2015-05-16 MED ORDER — SERTRALINE HCL 50 MG PO TABS
25.0000 mg | ORAL_TABLET | Freq: Every day | ORAL | Status: DC
  Administered 2015-05-16 – 2015-05-18 (×3): 25 mg via ORAL
  Filled 2015-05-16 (×3): qty 1

## 2015-05-16 MED ORDER — SODIUM CHLORIDE 0.9 % IV SOLN: 250.0000 mL | INTRAVENOUS | Status: DC | PRN

## 2015-05-16 MED ORDER — ISOSORBIDE MONONITRATE 15 MG HALF TABLET
15.0000 mg | ORAL_TABLET | Freq: Every day | ORAL | Status: DC
  Administered 2015-05-16: 15 mg via ORAL
  Filled 2015-05-16 (×2): qty 1

## 2015-05-16 MED ORDER — SODIUM CHLORIDE 0.9 % IV SOLN
INTRAVENOUS | Status: DC
  Administered 2015-05-16: 125 mL/h via INTRAVENOUS
  Administered 2015-05-16: 100 mL/h via INTRAVENOUS

## 2015-05-16 MED ORDER — TICAGRELOR 90 MG PO TABS
ORAL_TABLET | ORAL | Status: AC
  Filled 2015-05-16: qty 2

## 2015-05-16 MED ORDER — ATROPINE SULFATE 0.1 MG/ML IJ SOLN
INTRAMUSCULAR | Status: AC
  Filled 2015-05-16: qty 10

## 2015-05-16 MED ORDER — ALLOPURINOL 100 MG PO TABS
100.0000 mg | ORAL_TABLET | Freq: Every day | ORAL | Status: DC
  Administered 2015-05-16 – 2015-05-18 (×3): 100 mg via ORAL
  Filled 2015-05-16 (×3): qty 1

## 2015-05-16 MED ORDER — IOHEXOL 350 MG/ML SOLN
INTRAVENOUS | Status: DC | PRN
Start: 2015-05-16 — End: 2015-05-16
  Administered 2015-05-16: 360 mL via INTRA_ARTERIAL

## 2015-05-16 MED ORDER — ASPIRIN EC 81 MG PO TBEC: 81.0000 mg | DELAYED_RELEASE_TABLET | Freq: Every day | ORAL | Status: DC

## 2015-05-16 MED ORDER — LEVOTHYROXINE SODIUM 75 MCG PO TABS
75.0000 ug | ORAL_TABLET | Freq: Every day | ORAL | Status: DC
  Administered 2015-05-16 – 2015-05-18 (×3): 75 ug via ORAL
  Filled 2015-05-16 (×4): qty 1

## 2015-05-16 MED ORDER — NITROGLYCERIN IN D5W 200-5 MCG/ML-% IV SOLN
0.0000 ug/min | INTRAVENOUS | Status: DC
  Filled 2015-05-16: qty 250

## 2015-05-16 MED ORDER — CARVEDILOL 12.5 MG PO TABS
12.5000 mg | ORAL_TABLET | Freq: Once | ORAL | Status: AC
  Administered 2015-05-16: 12.5 mg via ORAL
  Filled 2015-05-16: qty 1

## 2015-05-16 MED ORDER — MORPHINE SULFATE 2 MG/ML IJ SOLN
2.0000 mg | INTRAMUSCULAR | Status: DC | PRN
  Administered 2015-05-16: 2 mg via INTRAVENOUS

## 2015-05-16 MED ORDER — ASPIRIN 81 MG PO CHEW
81.0000 mg | CHEWABLE_TABLET | Freq: Every day | ORAL | Status: DC
  Administered 2015-05-16 – 2015-05-18 (×3): 81 mg via ORAL
  Filled 2015-05-16 (×3): qty 1

## 2015-05-16 MED ORDER — ONDANSETRON HCL 4 MG/2ML IJ SOLN
4.0000 mg | Freq: Four times a day (QID) | INTRAMUSCULAR | Status: DC | PRN
  Administered 2015-05-16 (×3): 4 mg via INTRAVENOUS
  Filled 2015-05-16 (×2): qty 2

## 2015-05-16 MED ORDER — ONDANSETRON HCL 4 MG/2ML IJ SOLN: 4.0000 mg | Freq: Four times a day (QID) | INTRAMUSCULAR | Status: DC | PRN

## 2015-05-16 MED ORDER — ACETAMINOPHEN 325 MG PO TABS: 650.0000 mg | ORAL_TABLET | ORAL | Status: DC | PRN

## 2015-05-16 MED ORDER — SODIUM CHLORIDE 0.9 % IJ SOLN
3.0000 mL | Freq: Two times a day (BID) | INTRAMUSCULAR | Status: DC
  Administered 2015-05-16 (×2): 3 mL via INTRAVENOUS
  Administered 2015-05-17: 10 mL via INTRAVENOUS
  Administered 2015-05-17: 3 mL via INTRAVENOUS

## 2015-05-16 MED ORDER — NITROGLYCERIN IN D5W 200-5 MCG/ML-% IV SOLN: 20.0000 ug/min | INTRAVENOUS | Status: DC

## 2015-05-16 MED ORDER — SODIUM CHLORIDE 0.9 % IJ SOLN: 3.0000 mL | INTRAMUSCULAR | Status: DC | PRN

## 2015-05-16 MED ORDER — CARVEDILOL 25 MG PO TABS
25.0000 mg | ORAL_TABLET | Freq: Two times a day (BID) | ORAL | Status: DC
  Administered 2015-05-16 – 2015-05-18 (×5): 25 mg via ORAL
  Filled 2015-05-16 (×8): qty 1

## 2015-05-16 MED ORDER — SODIUM CHLORIDE 0.9 % IV SOLN
1.7500 mg/kg/h | INTRAVENOUS | Status: DC
  Filled 2015-05-16: qty 250

## 2015-05-16 MED ORDER — TICAGRELOR 90 MG PO TABS
ORAL_TABLET | ORAL | Status: DC | PRN
  Administered 2015-05-16: 180 mg via ORAL

## 2015-05-16 MED FILL — Heparin Sodium (Porcine) 2 Unit/ML in Sodium Chloride 0.9%: INTRAMUSCULAR | Qty: 1500 | Status: AC

## 2015-05-16 MED FILL — Lidocaine HCl Local Preservative Free (PF) Inj 1%: INTRAMUSCULAR | Qty: 30 | Status: AC

## 2015-05-16 NOTE — Progress Notes (Signed)
Patient Name: Fernando Combs Date of Encounter: 05/16/2015     Principal Problem:   STEMI (ST elevation myocardial infarction) Active Problems:   Hyperlipidemia   Essential hypertension   CAD (coronary atherosclerotic disease)   ST elevation myocardial infarction (STEMI) involving other coronary artery of anterior wall    SUBJECTIVE  Denies any SOB, continue to have mild 3/10 CP since onset around 9AM last night. Has been ongoing for 12 hours.   CURRENT MEDS . allopurinol  100 mg Oral Daily  . aspirin  81 mg Oral Daily  . atorvastatin  80 mg Oral q1800  . carvedilol  25 mg Oral BID WC  . heparin  5,000 Units Subcutaneous 3 times per day  . insulin aspart  0-5 Units Subcutaneous QHS  . insulin aspart  0-9 Units Subcutaneous TID WC  . isosorbide mononitrate  15 mg Oral Daily  . levothyroxine  75 mcg Oral QAC breakfast  . sertraline  25 mg Oral Daily  . sodium chloride  3 mL Intravenous Q12H  . ticagrelor  90 mg Oral BID    OBJECTIVE  Filed Vitals:   05/16/15 0800 05/16/15 0802 05/16/15 0900 05/16/15 1000  BP: 141/75  134/67 125/68  Pulse: 94  102 90  Temp:  97.4 F (36.3 C)    TempSrc:  Oral    Resp: 19  18 20   Height:      Weight:      SpO2: 97%  97% 99%    Intake/Output Summary (Last 24 hours) at 05/16/15 1004 Last data filed at 05/16/15 1000  Gross per 24 hour  Intake 1750.36 ml  Output   1650 ml  Net 100.36 ml   Filed Weights   05/15/15 2206 05/16/15 0113  Weight: 207 lb (93.895 kg) 206 lb 5.6 oz (93.6 kg)    PHYSICAL EXAM  General: Pleasant, NAD. Neuro: Alert and oriented X 3. Moves all extremities spontaneously. Psych: Normal affect. HEENT:  Normal  Neck: Supple without bruits or JVD. Lungs:  Resp regular and unlabored, CTA. Heart: RRR no s3, s4, or murmurs. R groin cath site stable.  Abdomen: Soft, non-tender, non-distended, BS + x 4.  Extremities: No clubbing, cyanosis or edema. DP/PT/Radials 2+ and equal bilaterally.  Accessory  Clinical Findings  CBC  Recent Labs  05/15/15 2153 05/15/15 2159 05/16/15 0228  WBC 11.1*  --  12.5*  NEUTROABS 7.0  --   --   HGB 15.5 15.6 14.5  HCT 44.1 46.0 41.1  MCV 84.5  --  83.4  PLT 236  --  007   Basic Metabolic Panel  Recent Labs  05/15/15 2153 05/15/15 2159 05/16/15 0228  NA 138 139 135  K 4.5 4.3 4.0  CL 103 107 101  CO2 20*  --  23  GLUCOSE 144* 144* 234*  BUN 36* 38* 32*  CREATININE 1.77* 1.90* 1.60*  CALCIUM 9.5  --  8.7*  MG  --   --  1.9   Cardiac Enzymes  Recent Labs  05/15/15 2153 05/16/15 0228  TROPONINI 0.04* 3.67*   Fasting Lipid Panel  Recent Labs  05/16/15 0228  CHOL 109  HDL 35*  LDLCALC 48  TRIG 128  CHOLHDL 3.1   Thyroid Function Tests  Recent Labs  05/16/15 0228  TSH 5.493*    TELE NSR    ECG  NSR with ST elevation in lateral leads  Echocardiogram  pending    Radiology/Studies  No results found.  ASSESSMENT AND PLAN  1. Lateral STEMI  - EF 35-40%, 95% prox LAD treated with PCTA reduced to 60%, unsuccessful attempt at wiring the diagonal vessel due to acute angle takeoff. Medical therapy  - continue ASA, lipitor, coreg, brilinta. Pending echo  - Cr 1.6 stable, add imdur for now. If Cr stable as outpatient, may consider ACEI at some point.   2. CAD s/p CABG in 2001 LIMA to LAD, SVG to diag, SVG to OM, SVG to RPDA  3. ICM EF 35-40%  4. HTN  5. HLD  6. DM type 2  7. Hypothyroidism  Signed, Almyra Deforest PA-C Pager: [phone]  History and all data above reviewed.  Still with some mild residual pain and "heart burn"  Patient examined.  I agree with the findings as above.  The patient exam reveals COR:RRR  ,  Lungs: Clear  ,  Abd: Positive bowel sounds, no rebound no guarding, Ext No edema  .  All available labs, radiology testing, previous records reviewed. Agree with documented assessment and plan.  Lateral STEMI:  Continue medical management for residual disease.  Assess EF with echo.  Titrate meds  for presumed ischemic cardiomyopathy.    Quadre Sharad Vaneaton  11:00 AM  05/16/2015

## 2015-05-16 NOTE — Progress Notes (Signed)
Pt reports progressive chest pain of 7/10 severity after attempt to wean off NTG gtt (per Cardiology PA order this am). NTG currently at rate of 62mcg/min. Dr Percival Spanish paged and updated. Order received for Morphine 2mg  every 4 hours prn for moderate to severe pain. Dr Percival Spanish also advised to leave NTG infusion rate at its current rate of 37mcg/min. Will continue to monitor pt.

## 2015-05-16 NOTE — Progress Notes (Signed)
CARDIAC REHAB PHASE I   PRE:  Rate/Rhythm: 79 SR  BP:  Supine: 137/75  Sitting: 147/72  Standing:    SaO2: 100% 2L  MODE:  Ambulation:  chair ft  1120-1150 Pt just off bedrest. Still with CP 3 on scale of 1-10. Assisted RN to get pt to recliner. Tolerated well. Will follow up for ambulation and education tomorrow as pt just off bedrest and still with some slight CP.        Graylon Good, RN BSN  05/16/2015 11:47 AM

## 2015-05-16 NOTE — Progress Notes (Signed)
Utilization Review Completed.  

## 2015-05-16 NOTE — Progress Notes (Signed)
Echocardiogram 2D Echocardiogram with Definity has been performed.  Tresa Res 05/16/2015, 2:50 PM

## 2015-05-17 LAB — BASIC METABOLIC PANEL
ANION GAP: 11 (ref 5–15)
BUN: 19 mg/dL (ref 6–20)
CALCIUM: 9 mg/dL (ref 8.9–10.3)
CO2: 24 mmol/L (ref 22–32)
Chloride: 101 mmol/L (ref 101–111)
Creatinine, Ser: 1.82 mg/dL — ABNORMAL HIGH (ref 0.61–1.24)
GFR calc non Af Amer: 36 mL/min — ABNORMAL LOW (ref >60)
GFR, EST AFRICAN AMERICAN: 42 mL/min — AB (ref >60)
Glucose, Bld: 180 mg/dL — ABNORMAL HIGH (ref 65–99)
Potassium: 3.6 mmol/L (ref 3.5–5.1)
Sodium: 136 mmol/L (ref 135–145)

## 2015-05-17 LAB — GLUCOSE, CAPILLARY
GLUCOSE-CAPILLARY: 169 mg/dL — AB (ref 65–99)
GLUCOSE-CAPILLARY: 126 mg/dL — AB (ref 65–99)
Glucose-Capillary: 157 mg/dL — ABNORMAL HIGH (ref 65–99)
Glucose-Capillary: 109 mg/dL — ABNORMAL HIGH (ref 65–99)

## 2015-05-17 LAB — HEMOGLOBIN A1C
Hgb A1c MFr Bld: 6.3 % — ABNORMAL HIGH (ref 4.8–5.6)
MEAN PLASMA GLUCOSE: 134 mg/dL

## 2015-05-17 MED ORDER — ISOSORBIDE MONONITRATE ER 60 MG PO TB24
60.0000 mg | ORAL_TABLET | Freq: Every day | ORAL | Status: DC
  Administered 2015-05-17 – 2015-05-18 (×2): 60 mg via ORAL
  Filled 2015-05-17 (×2): qty 1

## 2015-05-17 NOTE — Progress Notes (Signed)
CARDIAC REHAB PHASE I   PRE:  Rate/Rhythm: 94 SR  BP:  Sitting: 129/61        SaO2: 96 RA  MODE:  Ambulation: 500 ft   POST:  Rate/Rhythm: 110 ST  BP:  Sitting: 150/82         SaO2: 97 RA  Pt ambulated 500 ft on RA, independent, steady gait, tolerated well. Pt denies cp, dizziness, DOE, declined rest stop. Pt states he feels "fine" today and could have gone farther, but was accompanied by his 70 year old granddaughter who wanted to return to the room. Pt states he is concerned that he still has a blockage and had symptoms yesterday.  Pt states "my granddaughter is my life and I have to be here for her." Pt states his granddaughters parents are getting divorced and he and his family have been under a lot of stress; his grand daughter spends a lot of time with him, under his care. Pt is very anxious about his residual heart disease.  Began MI/PCI education. Reviewed risk factors,  activity restrictions, ntg, exercise, and phase 2 cardiac rehab. Pt verbalized understanding. Pt agrees to phase 2 cardiac rehab. Will send referral to [facility] where he completed outpatient cardiac rehab after his heart surgery.  Pt encouraged to ambulate as tolerated. To recliner after walk, call bell within reach. Will follow-up tomorrow.   [phone]  Lenna Sciara, RN, BSN 05/17/2015 3:25 PM

## 2015-05-17 NOTE — Progress Notes (Signed)
    SUBJECTIVE:  No pain this AM.  He had pain yesterday when they weaned the IV NTG.  He is not SOB.     PHYSICAL EXAM Filed Vitals:   05/17/15 0535 05/17/15 0600 05/17/15 0630 05/17/15 0700  BP: 159/76 148/73 129/67 92/53  Pulse: 99 88 102 93  Temp:      TempSrc:      Resp: 17 22 20 21   Height:      Weight:      SpO2: 97% 95% 98% 95%   General:  No distress Lungs:  Clear Heart:  RRR Abdomen:  Positive bowel sounds, no rebound no guarding Extremities:  Right femoral cath site OK.  LABS: Lab Results  Component Value Date   TROPONINI 3.67* 05/16/2015   Results for orders placed or performed during the hospital encounter of 05/15/15 (from the past 24 hour(s))  Glucose, capillary     Status: Abnormal   Collection Time: 05/16/15 11:46 AM  Result Value Ref Range   Glucose-Capillary 180 (H) 65 - 99 mg/dL   Comment 1 Capillary Specimen   Glucose, capillary     Status: Abnormal   Collection Time: 05/16/15  6:03 PM  Result Value Ref Range   Glucose-Capillary 152 (H) 65 - 99 mg/dL   Comment 1 Capillary Specimen   Glucose, capillary     Status: Abnormal   Collection Time: 05/16/15  9:42 PM  Result Value Ref Range   Glucose-Capillary 130 (H) 65 - 99 mg/dL   Comment 1 Capillary Specimen    Comment 2 Notify RN    Comment 3 Document in Chart     Intake/Output Summary (Last 24 hours) at 05/17/15 0813 Last data filed at 05/17/15 0700  Gross per 24 hour  Intake   1419 ml  Output   4000 ml  Net  -2581 ml     ASSESSMENT AND PLAN:  Lateral STEMI:  EF 50 - 55% by echo.     OK to wean IV NTG.  I will increase Imdur.  Move to tele bed.  Probable discharge in AM.   HTN  BP slightly labile.  However, for the most part high.  I will increase the Imdur  CKD:  Check creat today.    HLD:  Continue high dose Lipitor.   DM type 2:  Continue current meds.  A1C is 6.3.  This appears to be diet controlled at home.   Hypothyroidism:  TSH is slightly high.  Continue current dose of  synthroid.  This can be followed as an out patient and dose adjusted as needed.    Fernando Combs [facility] 05/17/2015 8:13 AM

## 2015-05-18 ENCOUNTER — Telehealth: Payer: Self-pay | Admitting: Cardiology

## 2015-05-18 ENCOUNTER — Encounter (HOSPITAL_COMMUNITY): Payer: Self-pay | Admitting: Physician Assistant

## 2015-05-18 DIAGNOSIS — I214 Non-ST elevation (NSTEMI) myocardial infarction: Secondary | ICD-10-CM

## 2015-05-18 LAB — GLUCOSE, CAPILLARY: Glucose-Capillary: 105 mg/dL — ABNORMAL HIGH (ref 65–99)

## 2015-05-18 MED ORDER — ASPIRIN 81 MG PO CHEW: 81.0000 mg | CHEWABLE_TABLET | Freq: Every day | ORAL | Status: AC

## 2015-05-18 MED ORDER — TICAGRELOR 90 MG PO TABS: 90.0000 mg | ORAL_TABLET | Freq: Two times a day (BID) | ORAL | Status: DC

## 2015-05-18 MED ORDER — ATORVASTATIN CALCIUM 80 MG PO TABS: 80.0000 mg | ORAL_TABLET | Freq: Every day | ORAL | Status: DC

## 2015-05-18 MED ORDER — ISOSORBIDE MONONITRATE ER 60 MG PO TB24: 60.0000 mg | ORAL_TABLET | Freq: Every day | ORAL | Status: DC

## 2015-05-18 MED ORDER — NITROGLYCERIN 0.4 MG SL SUBL: 0.4000 mg | SUBLINGUAL_TABLET | SUBLINGUAL | Status: DC | PRN

## 2015-05-18 NOTE — Discharge Summary (Signed)
Discharge Summary   Patient ID: Fernando Combs,  MRN: [id number], DOB/AGE: 05-03-1946 69 y.o.  Admit date: 05/15/2015 Discharge date: 05/18/2015  Primary Care Provider: Drema Pry Primary Cardiologist: previously Dr. Stanford Breed (but last visit in Epic appears to be 2011)  Discharge Diagnoses Principal Problem:   STEMI (ST elevation myocardial infarction) Active Problems:   Hyperlipidemia   Essential hypertension   CAD (coronary atherosclerotic disease)   ST elevation myocardial infarction (STEMI) involving other coronary artery of anterior wall   Allergies No Known Allergies  Procedures  Echocardiogram 05/16/2015 LV EF: 50% -  55%  ------------------------------------------------------------------- Indications:   MI - acute 410.91.  ------------------------------------------------------------------- History:  PMH:  Myocardial infarction. Risk factors: Hypertension. Diabetes mellitus.  ------------------------------------------------------------------- Study Conclusions  - Left ventricle: Poor endocardial definition Possible posterior lateral hypokinesis on definity images. The cavity size was mildly dilated. There was moderate concentric hypertrophy. Systolic function was normal. The estimated ejection fraction was in the range of 50% to 55%. - Mitral valve: There was mild regurgitation. - Left atrium: The atrium was mildly dilated. - Atrial septum: No defect or patent foramen ovale was identified.    Cardiac catheterization 05/16/2015 Conclusion     Mid RCA lesion, 100% stenosed.  SVG was injected is normal in caliber, and is anatomically normal.  LIMA was injected is normal in caliber, and is anatomically normal.  1st Diag lesion, 100% stenosed.  Prox LAD to Mid LAD lesion, 100% stenosed.  Ost LAD to Prox LAD lesion, 95% stenosed.  Prox Cx lesion, 100% stenosed.  SVG was injected is normal in caliber, and is anatomically normal.  was  injected .  There is severe disease in the graft.  Origin lesion, 100% stenosed.  There is moderate left ventricular systolic dysfunction.  Acute coronary syndrome/STEMI with 1 mm ST elevation in leads 1 aVL, V5 and V6 with inferior T changes.  Moderate left ventricular dysfunction with severe hypo-to akinesis involving the mid anterolateral wall and hypokinesis of the mid inferior wall on the RAo projection with hypo-to akinesis involving the low to mid posterior lateral wall and the LAO projection. Estimated ejection fraction is a aproximately 35-40%.  Significant native coronary artery disease with 95% proximal LAD stenosis after proximal septal and prior to takeoff of the first diagonal branch. The diagonal branch was occluded in the proximal third of the vessel. The LAD was occluded shortly thereafter the diagonal takeoff; subtotal total occlusion of the AV groove circumflex; total occlusion of the mid RCA after the anterior RV marginal branch.  Patent LIMA graft supplying the mid LAD with filling of the LAD, proximal to the point of occlusion.  Patent SVG supplying the distal obtuse marginal branch of the circumflex coronary artery.  Occluded vein graft which had supplied the diagonal vessel.  Patent SVG supplying the PDA branch of the distal RCA.  PTCA of the very proximal LAD prior to the diagonal/LAD bifurcation with a 95+ percent stenosis being reduced to 60% and unsuccessful attempt at wiring the diagonal vessel due to the acute angle takeoff which was occluded in its proximal third segment and had been supplied by the vein graft.  RECOMMENDATION:  Medical therapy.       Hospital Course  The patient is a 69 year old male with past medical history of HLD, HTN, DM2, CAD s/p CABG 2001 with LIMA to LAD, SVG to diagonal, SVG to OM, SVG to RPDA. He presented to [facility] on 05/15/2015 with acute onset of chest pain. He described the  chest discomfort as a  pressure-like sensation radiating to his left arm with 8 out of 10 in intensity with associated nausea and shortness of breath that started around 8 PM on the night of 05/15/2015. EMS was called, en route to [facility], he received aspirin, IV heparin, nitroglycerin. EKG demonstrated ST elevation in V5, V6, 1, aVL and reciprocal depression in 3 and aVF.   Patient was taken emergently to the cath lab which showed patent LIMA to LAD, SVG to OM, SVG to RPDA, SVG to diagonal was occluded with severe native D1 disease. He was noted to have 95% proximal LAD stenosis after proximal septal and prior to the takeoff of D1 branch, the diagonal branch was occluded at the proximal third of the vessel, LAD was occluded shortly after the diagonal takeoff, subtotal occlusion of the AV groove circumflex and total occlusion of the native mid RCA after anterior RV marginal branch. PTCA was attempted at the very proximal LAD prior to the diagonal and LAD bifurcation with 95% stenosis being reduced down to 60%, unsuccessful attempt at wiring the diagonal vessel due to acute angle takeoff which was occluded at the proximal third segment and the has been supplied by the vein graft. Medical therapy was recommended. Patient was seen on the following day on 05/16/2015, at which time he continued to have 3 out of 10 chest pain which has been persistent. The chest pain worsens with weaning of IV nitroglycerin. He was started on PO Imdur with up titration to 60 mg daily and resolution of chest pain. He was transferred out of ICU on 6/23.  He was seen in the morning of 05/18/2015, at which time he has been chest pain free for more than 24 hours. He denies any shortness breath. During cardiac catheterization, he was noted to have an ejection fraction of 35-45% by visual estimate. Echocardiogram was obtained on 6/22 which showed EF has improved to 50-55%, mild MR. Emphasis has been placed on compliance with aspirin and Brilinta. Overall  the patient is doing well and is deemed stable for discharge from cardiology perspective. We will ask cardiac rehabilitation to see the patient again prior to leaving the hospital. I have discussed with the patient sign an dsymptom of angina given the 60% residual prox LAD dx. I have instructed him to seek immediate medical attention if has any recurrent chest pain with exertion and especially if chest pain lasting longer than 20 minutes at rest. I will make sure he has enough sublingual nitroglycerin at home. I will arrange a 7-10 day transition of care follow-up in the office.    Discharge Vitals Blood pressure 120/70, pulse 82, temperature 98.5 F (36.9 C), temperature source Oral, resp. rate 18, height 5\' 10"  (1.778 m), weight 206 lb 14.4 oz (93.849 kg), SpO2 97 %.  Filed Weights   05/16/15 0113 05/17/15 0500 05/18/15 0500  Weight: 206 lb 5.6 oz (93.6 kg) 206 lb 9.1 oz (93.7 kg) 206 lb 14.4 oz (93.849 kg)    Labs  CBC  Recent Labs  05/15/15 2153 05/15/15 2159 05/16/15 0228  WBC 11.1*  --  12.5*  NEUTROABS 7.0  --   --   HGB 15.5 15.6 14.5  HCT 44.1 46.0 41.1  MCV 84.5  --  83.4  PLT 236  --  676   Basic Metabolic Panel  Recent Labs  05/16/15 0228 05/17/15 0905  NA 135 136  K 4.0 3.6  CL 101 101  CO2 23 24  GLUCOSE  234* 180*  BUN 32* 19  CREATININE 1.60* 1.82*  CALCIUM 8.7* 9.0  MG 1.9  --    Cardiac Enzymes  Recent Labs  05/15/15 2153 05/16/15 0228  TROPONINI 0.04* 3.67*   Hemoglobin A1C  Recent Labs  05/16/15 0228  HGBA1C 6.3*   Fasting Lipid Panel  Recent Labs  05/16/15 0228  CHOL 109  HDL 35*  LDLCALC 48  TRIG 128  CHOLHDL 3.1   Thyroid Function Tests  Recent Labs  05/16/15 0228  TSH 5.493*    Disposition  Pt is being discharged home today in good condition.  Follow-up Plans & Appointments      Follow-up Information    Follow up with Richardson Dopp, PA-C On 05/29/2015.   Specialties:  Physician Assistant, Radiology,  Interventional Cardiology   Why:  10 day TCM cardiology followup. 8:00am   Contact information:   [address] [place] [address] [phone]       Discharge Medications    Medication List    STOP taking these medications        amLODipine 10 MG tablet  Commonly known as:  NORVASC     aspirin EC 325 MG tablet  Replaced by:  aspirin 81 MG chewable tablet     losartan 50 MG tablet  Commonly known as:  COZAAR      TAKE these medications        allopurinol 100 MG tablet  Commonly known as:  ZYLOPRIM  TAKE 1 TABLET BY MOUTH DAILY FOR THE FIRST WEEK, THEN TAKE 2 TABLETS BY MOUTH DAILY.     aspirin 81 MG chewable tablet  Chew 1 tablet (81 mg total) by mouth daily.     atorvastatin 80 MG tablet  Commonly known as:  LIPITOR  Take 1 tablet (80 mg total) by mouth daily at 6 PM.     carvedilol 25 MG tablet  Commonly known as:  COREG  Take 1 tablet by mouth two  times daily with a meal     colchicine 0.6 MG tablet  Take 0.6 mg by mouth daily as needed. For gout     isosorbide mononitrate 60 MG 24 hr tablet  Commonly known as:  IMDUR  Take 1 tablet (60 mg total) by mouth daily.     levothyroxine 75 MCG tablet  Commonly known as:  SYNTHROID, LEVOTHROID  Take 1 tablet by mouth  daily     nitroGLYCERIN 0.4 MG SL tablet  Commonly known as:  NITROSTAT  Place 1 tablet (0.4 mg total) under the tongue every 5 (five) minutes x 3 doses as needed for chest pain.     sertraline 25 MG tablet  Commonly known as:  ZOLOFT  Take 1 tablet by mouth  daily     ticagrelor 90 MG Tabs tablet  Commonly known as:  BRILINTA  Take 1 tablet (90 mg total) by mouth 2 (two) times daily.         Duration of Discharge Encounter   Greater than 30 minutes including physician time.  Hilbert Corrigan PA-C Pager: [phone] 05/18/2015, 9:51 AM   Patient seen and examined.  Plan as discussed in my rounding note for today and outlined above. [facility]  05/18/2015   11:04 AM

## 2015-05-18 NOTE — Progress Notes (Addendum)
CARDIAC REHAB PHASE I   Pt has walked 800 ft x2 this am. Feels well. Ed completed/reviewed with pt and wife. Voiced understanding.  [id number]  Josephina Shih [place] CES, ACSM 05/18/2015 10:14 AM

## 2015-05-18 NOTE — Telephone Encounter (Signed)
New message   10 days TCM per Billey Chang PA   appt on  7.5.2016 @ 8 am

## 2015-05-18 NOTE — Progress Notes (Signed)
Patient Name: Fernando Combs Date of Encounter: 05/18/2015  Primary Cardiologist: previously Dr. Stanford Breed (but last visit in Epic appears to be 2011)   Principal Problem:   STEMI (ST elevation myocardial infarction) Active Problems:   Hyperlipidemia   Essential hypertension   CAD (coronary atherosclerotic disease)   ST elevation myocardial infarction (STEMI) involving other coronary artery of anterior wall    SUBJECTIVE   Denies any CP for the past 24 hours. No SOB.   CURRENT MEDS . allopurinol  100 mg Oral Daily  . aspirin  81 mg Oral Daily  . atorvastatin  80 mg Oral q1800  . carvedilol  25 mg Oral BID WC  . heparin  5,000 Units Subcutaneous 3 times per day  . insulin aspart  0-5 Units Subcutaneous QHS  . insulin aspart  0-9 Units Subcutaneous TID WC  . isosorbide mononitrate  60 mg Oral Daily  . levothyroxine  75 mcg Oral QAC breakfast  . sertraline  25 mg Oral Daily  . sodium chloride  3 mL Intravenous Q12H  . ticagrelor  90 mg Oral BID    OBJECTIVE  Filed Vitals:   05/17/15 1025 05/17/15 1726 05/17/15 2024 05/18/15 0500  BP: 119/63 130/67 110/54 131/71  Pulse: 77 91 91 79  Temp: 98.2 F (36.8 C)  100.2 F (37.9 C) 98.5 F (36.9 C)  TempSrc: Oral  Oral Oral  Resp: 16  18 18   Height: 5\' 10"  (1.778 m)     Weight:    206 lb 14.4 oz (93.849 kg)  SpO2: 100%  95% 97%    Intake/Output Summary (Last 24 hours) at 05/18/15 0729 Last data filed at 05/18/15 0600  Gross per 24 hour  Intake    986 ml  Output   2350 ml  Net  -1364 ml   Filed Weights   05/16/15 0113 05/17/15 0500 05/18/15 0500  Weight: 206 lb 5.6 oz (93.6 kg) 206 lb 9.1 oz (93.7 kg) 206 lb 14.4 oz (93.849 kg)    PHYSICAL EXAM  General: Pleasant, NAD. Neuro: Alert and oriented X 3. Moves all extremities spontaneously. Psych: Normal affect. HEENT:  Normal  Neck: Supple without bruits or JVD. Lungs:  Resp regular and unlabored, CTA. Heart: RRR no s3, s4, or murmurs. R groin cath site stable.   Abdomen: Soft, non-tender, non-distended, BS + x 4.  Extremities: No clubbing, cyanosis or edema. DP/PT/Radials 2+ and equal bilaterally.  Accessory Clinical Findings  CBC  Recent Labs  05/15/15 2153 05/15/15 2159 05/16/15 0228  WBC 11.1*  --  12.5*  NEUTROABS 7.0  --   --   HGB 15.5 15.6 14.5  HCT 44.1 46.0 41.1  MCV 84.5  --  83.4  PLT 236  --  017   Basic Metabolic Panel  Recent Labs  05/16/15 0228 05/17/15 0905  NA 135 136  K 4.0 3.6  CL 101 101  CO2 23 24  GLUCOSE 234* 180*  BUN 32* 19  CREATININE 1.60* 1.82*  CALCIUM 8.7* 9.0  MG 1.9  --    Cardiac Enzymes  Recent Labs  05/15/15 2153 05/16/15 0228  TROPONINI 0.04* 3.67*   Fasting Lipid Panel  Recent Labs  05/16/15 0228  CHOL 109  HDL 35*  LDLCALC 48  TRIG 128  CHOLHDL 3.1   Thyroid Function Tests  Recent Labs  05/16/15 0228  TSH 5.493*    TELE NSR    ECG  No new EKG  Echocardiogram 05/16/2015  LV EF: 50% -  55%  ------------------------------------------------------------------- Indications:   MI - acute 410.91.  ------------------------------------------------------------------- History:  PMH:  Myocardial infarction. Risk factors: Hypertension. Diabetes mellitus.  ------------------------------------------------------------------- Study Conclusions  - Left ventricle: Poor endocardial definition Possible posterior lateral hypokinesis on definity images. The cavity size was mildly dilated. There was moderate concentric hypertrophy. Systolic function was normal. The estimated ejection fraction was in the range of 50% to 55%. - Mitral valve: There was mild regurgitation. - Left atrium: The atrium was mildly dilated. - Atrial septum: No defect or patent foramen ovale was identified.     Radiology/Studies  No results found.  ASSESSMENT AND PLAN  1. Lateral STEMI  - Cath 05/16/2015 EF 35-40%, 95% prox LAD treated with PCTA reduced to 60%, unsuccessful  attempt at wiring the diagonal vessel due to acute angle takeoff. Medical therapy  - Echo 05/16/2015 EF 50-55%, moderate LVH, mild MR  - continue ASA, lipitor, coreg, brilinta. Imdur added during this admission, uptitrated to 60mg     - stable for discharge today, last visit with Dr. Stanford Breed appears to be 2011, will need 7 day TOC followup  2. CAD s/p CABG in 2001 LIMA to LAD, SVG to diag, SVG to OM, SVG to RPDA  3. ICM EF 35-40% on cath, improved to 50-55% by echo on the following day  4. HTN  5. HLD  6. DM type 2: hgb A1C 6.3  7. Hypothyroidism: TSH mildly high, need outpatient followup. Discussed with patient who will followup with PCP  Signed, Almyra Deforest PA-C Pager: [phone]   History and all data above reviewed.  Patient examined.  I agree with the findings as above.   No chest pain.  Wants to go home.   The patient exam reveals COR:RRR  ,  Lungs: Clear   ,  Abd: Positive bowel sounds, no rebound no guarding, Ext No edema  .  All available labs, radiology testing, previous records reviewed. Agree with documented assessment and plan. OK to go home.  Meds as listed.  Needs cardiac rehab.   Fernando Combs  9:14 AM  05/18/2015

## 2015-05-18 NOTE — Discharge Instructions (Signed)
Acute Coronary Syndrome  Acute coronary syndrome (ACS) is an urgent problem in which the blood and oxygen supply to the heart is critically deficient. ACS requires hospitalization because one or more coronary arteries may be blocked.  ACS represents a range of conditions including:  · Previous angina that is now unstable, lasts longer, happens at rest, or is more intense.  · A heart attack, with heart muscle cell injury and death.  There are three vital coronary arteries that supply the heart muscle with blood and oxygen so that it can pump blood effectively. If blockages to these arteries develop, blood flow to the heart muscle is reduced. If the heart does not get enough blood, angina may occur as the first warning sign.  SYMPTOMS   · The most common signs of angina include:  ¨ Tightness or squeezing in the chest.  ¨ Feeling of heaviness on the chest.  ¨ Discomfort in the arms, neck, back, or jaw.  ¨ Shortness of breath and nausea.  ¨ Cold, wet skin.  · Angina is usually brought on by physical effort or excitement which increase the oxygen needs of the heart. These states increase the blood flow needs of the heart beyond what can be delivered.  · Other symptoms that are not as common include:  ¨ Fatigue  ¨ Unexplained feelings of nervousness or anxiety  ¨ Weakness  ¨ Diarrhea  · Sometimes, you may not have noticed any symptoms at all but still suffered a cardiac injury.  TREATMENT   · Medicines to help discomfort may include nitroglycerin (nitro) in the form of tablets or a spray for rapid relief, or longer-acting forms such as cream, patches, or capsules. (Be aware that there are many side effects and possible interactions with other drugs).  · Other medicines may be used to help the heart pump better.  · Procedures to open blocked arteries including angioplasty or stent placement to keep the arteries open.  · Open heart surgery may be needed when there are many blockages or they are in critical locations that  are best treated with surgery.  HOME CARE INSTRUCTIONS   · Do not use any tobacco products including cigarettes, chewing tobacco, or electronic cigarettes.  · Take one baby or adult aspirin daily, if your health care provider advises. This helps reduce the risk of a heart attack.  · It is very important that you follow the angina treatment prescribed by your health care provider. Make arrangements for proper follow-up care.  · Eat a heart healthy diet with salt and fat restrictions as advised.  · Regular exercise is good for you as long as it does not cause discomfort. Do not begin any new type of exercise until you check with your health care provider.  · If you are overweight, you should lose weight.  · Try to maintain normal blood lipid levels.  · Keep your blood pressure under control as recommended by your health care provider.  · You should tell your health care provider right away about any increase in the severity or frequency of your chest discomfort or angina attacks. When you have angina, you should stop what you are doing and sit down. This may bring relief in 3 to 5 minutes. If your health care provider has prescribed nitro, take it as directed.  · If your health care provider has given you a follow-up appointment, it is very important to keep that appointment. Not keeping the appointment could result in a chronic or   of breath. °· You feel faint, lightheaded, or pass out. °· Your chest discomfort gets worse. °· You are sweating or experience sudden profound fatigue. °· You do not get relief of your chest pain after 3 doses of nitro. °· Your discomfort lasts longer than 15 minutes. °MAKE SURE YOU:  °· Understand these instructions. °· Will watch your condition. °· Will get help right  away if you are not doing well or get worse. °· Take all medicines as directed by your health care provider. °Document Released: 11/10/2005 Document Revised: 11/15/2013 Document Reviewed: 03/14/2014 °Exit[employer]® Patient Information ©2015 [employer]. This information is not intended to replace advice given to you by your health care provider. Make sure you discuss any questions you have with your health care provider. ° °No driving for 24hours. No lifting over 5 lbs for 1 week. No sexual activity for 1 week. Keep procedure site clean & dry. If you notice increased pain, swelling, bleeding or pus, call/return!  You may shower, but no soaking baths/hot tubs/pools for 1 week.  ° ° °

## 2015-05-18 NOTE — Care Management Note (Signed)
Case Management Note  Patient Details  Name: [name] MRN: [id number] Date of Birth: 10-09-1946  Subjective/Objective: Pt in for stemi- post cath plan for home on Brilinta. Pt was given 30 day free card.              Action/Plan:No further needs at this time.    Expected Discharge Date:                  Expected Discharge Plan:  Home/Self Care  In-House Referral:     Discharge planning Services  CM Consult, Medication Assistance  Post Acute Care Choice:    Choice offered to:     DME Arranged:    DME Agency:     HH Arranged:    [place] Agency:     Status of Service:  Completed, signed off  Medicare Important Message Given:  Yes Date Medicare IM Given:  05/18/15 Medicare IM give by:  Jacqlyn Krauss, RN, BSN  Date Additional Medicare IM Given:    Additional Medicare Important Message give by:     If discussed at [place] of Stay Meetings, dates discussed:    Additional Comments:  Bethena Roys, RN 05/18/2015, 1:11 PM

## 2015-05-21 ENCOUNTER — Telehealth: Payer: Self-pay | Admitting: Cardiovascular Disease

## 2015-05-21 NOTE — Telephone Encounter (Signed)
spoke to wife , call back - patient unable to come to phone at present time  Patient contacted regarding discharge from [facility] on 05/18/2015.  Patient understands to follow up with provider Richardson Dopp on 05/29/2015 at 8 am at  [facility] [address]. Patient understands discharge instructions?  Patient understands medications and regiment?  Patient understands to bring all medications to this visit?

## 2015-05-21 NOTE — Telephone Encounter (Signed)
Pt will need a a TCM phone call He is scheduled to see Richardson Dopp on 7/5 at 8:00am  Thanks

## 2015-05-21 NOTE — Telephone Encounter (Signed)
  Patient understands to follow up with provider Richardson Dopp on 05/29/15 at  8 am at [facility]. Patient understands discharge instructions? yes  Patient understands medications and regiment? yes  Patient understands to bring all medications to this visit? yes

## 2015-05-21 NOTE — Telephone Encounter (Signed)
See other telephone message Spoke to patient.

## 2015-05-22 ENCOUNTER — Telehealth: Payer: Self-pay | Admitting: Internal Medicine

## 2015-05-22 MED ORDER — CARVEDILOL 25 MG PO TABS: ORAL_TABLET | ORAL | Status: DC

## 2015-05-22 MED ORDER — LEVOTHYROXINE SODIUM 75 MCG PO TABS: ORAL_TABLET | ORAL | Status: DC

## 2015-05-22 NOTE — Telephone Encounter (Signed)
rx sent in electronically 

## 2015-05-22 NOTE — Telephone Encounter (Signed)
Pt needs a refills on carvedilol and levothyroxine #90 each [facility]. Pt has an appt sch 06-08-15

## 2015-05-27 NOTE — Progress Notes (Signed)
Cardiology Office Note   Date:  05/29/2015   ID:  ROCKNEY GRENZ, DOB Apr 21, 1946, MRN [id number]  PCP:  Drema Pry, DO  Cardiologist:  Dr. Kirk Ruths     Chief Complaint  Patient presents with  . Hospitalization Follow-up    s/p STEMI  . Coronary Artery Disease     History of Present Illness: Fernando Combs is a 69 y.o. male with a hx of HLD, HTN, DM2, CAD s/p CABG 2001 with LIMA to LAD, SVG to diagonal, SVG to OM, SVG to RPDA, carotid stenosis (CTA 09/2009 with 75% right and 76% left carotid stenosis), CKD. Last seen by Dr. Stanford Breed in 2011.  Admitted 6/21-6/24 with a lateral STEMI. EKG demonstrated ST elevation in V5, V6, 1, aVL and reciprocal depression in 3 and aVF. Patient was taken emergently to the cath lab. LHC showed patent LIMA to LAD, SVG to OM, SVG to RPDA, SVG to diagonal was occluded with severe native D1 disease. He was noted to have 95% proximal LAD stenosis after proximal septal and prior to the takeoff of D1 branch, the diagonal branch was occluded at the proximal third of the vessel, LAD was occluded shortly after the diagonal takeoff, subtotal occlusion of the AV groove circumflex and total occlusion of the native mid RCA after anterior RV marginal branch. PTCA was performed on the very proximal LAD prior to the diagonal and LAD bifurcation with 95% stenosis being reduced down to 60%.  There was unsuccessful attempt at wiring the diagonal vessel due to acute angle takeoff which was occluded at the proximal third segment and had been supplied by the vein graft. Medical therapy was recommended. Post PCI, he continued to have chest discomfort which worsened with weaning of IV nitroglycerin. He was placed on oral nitrates with resolution of symptoms. EF on left ventriculogram during cardiac catheterization was 35-40%.  Follow-up echo demonstrated EF 50-55% with possible posterior lateral HK, mild MR, mild LAE.  Returns for follow-up. Since DC, he has been doing well. He  denies chest discomfort. He does have some dyspnea with exertion. This is chronic and unchanged. He is NYHA 2-2b. He denies orthopnea, PND or edema. He denies syncope. He has had some dizziness was standing abruptly.  This is fairly mild.   Studies/Reports Reviewed Today:  LHC 05/16/15 LAD:  prox 95%, mid 100%, D1 100% LCx:  100% RCA:  Mid 100% S-RPDA ok Free LIMA-LAD:  Ok S-OM3: ok S-D1:  100% EF:  35-40% PCI:  PTCA of the very proximal LAD prior to the diagonal/LAD bifurcation with a 95+ percent stenosis being reduced to 60% and unsuccessful attempt at wiring the diagonal vessel due to the acute angle takeoff which was occluded in its proximal third segment and had been supplied by the vein graft.  Echo 05/16/15 - Left ventricle: Poor endocardial definition Possible posterior lateral hypokinesis on definity images. The cavity size was mildly dilated. There was moderate concentric hypertrophy. Systolic function was normal. The estimated ejection fraction was in the range of 50% to 55%. - Mitral valve: There was mild regurgitation. - Left atrium: The atrium was mildly dilated. - Atrial septum: No defect or patent foramen ovale was identified.  Carotid US 12/12/14 R 40-59% L < 40%  LE Art Duplex 12/12/14 R 0.9; L 1.0  Myoview 10/2009 EF 49%; no ischemia, inf-lat scar; Low Risk   Past Medical History  Diagnosis Date  . History of myocardial infarction   . Hypertension   . Hyperlipidemia   .  CAD (coronary artery disease)     cath 05/16/2015 patent LIMA to LAD, SVG to OM, SVG to RPDA, SVG to diagonal was occluded. PTCA 95% prox LAD reduce down to 60%. Medical management  . Diabetic peripheral neuropathy   . Benign prostatic hypertrophy   . Diabetes mellitus type II   . History of tobacco abuse   . History of syncope     most likely vasovagal  . Thrombus 04/2008    acute thrombus of his right superficial artery with claudication  . Diabetes, polyneuropathy   .  Colitis, ischemic 06/2005     history of  . DVT (deep venous thrombosis)   . Myocardial infarction   . Peripheral vascular disease   . Carotid artery occlusion     Past Surgical History  Procedure Laterality Date  . Other surgical history  11/2010    angiogram, right leg stent placement-  -Dr Kellie Simmering  . Peripheral vascular surgery    . Pr vein bypass graft,aorto-fem-pop  08/04/00  . Coronary artery bypass graft  08/04/00    times 4 (left interanl mammary artery to the left anterior descending, saphenous vein,graft to diagonal, saphenous vein graft to obtuse marginal, spahenous vein graft to posterior descending-Surgeon  Tharon Aquas Tright III  . Lower extremity angiogram  March 09, 2012  . Lower extremity angiogram Right 03/09/2012    Procedure: LOWER EXTREMITY ANGIOGRAM;  Surgeon: Serafina Mitchell, MD;  Location: [facility] CATH LAB;  Service: Cardiovascular;  Laterality: Right;  . Cardiac catheterization N/A 05/15/2015    Procedure: Left Heart Cath and Cors/Grafts Angiography;  Surgeon: Troy Sine, MD;  Location: [facility] LAB;  Service: Cardiovascular;  Laterality: N/A;     Current Outpatient Prescriptions  Medication Sig Dispense Refill  . allopurinol (ZYLOPRIM) 100 MG tablet Take 200 mg by mouth daily.    Marland Kitchen aspirin 81 MG chewable tablet Chew 1 tablet (81 mg total) by mouth daily.    Marland Kitchen atorvastatin (LIPITOR) 80 MG tablet Take 1 tablet (80 mg total) by mouth daily at 6 PM. 30 tablet 11  . carvedilol (COREG) 25 MG tablet Take 1 tablet by mouth two  times daily with a meal 180 tablet 0  . colchicine 0.6 MG tablet Take 0.6 mg by mouth daily as needed. For gout    . isosorbide mononitrate (IMDUR) 60 MG 24 hr tablet Take 1 tablet (60 mg total) by mouth daily. 30 tablet 11  . levothyroxine (SYNTHROID, LEVOTHROID) 75 MCG tablet Take 1 tablet by mouth  daily 90 tablet 0  . nitroGLYCERIN (NITROSTAT) 0.4 MG SL tablet Place 1 tablet (0.4 mg total) under the tongue every 5 (five) minutes x 3 doses as  needed for chest pain. 25 tablet 3  . sertraline (ZOLOFT) 25 MG tablet Take 1 tablet by mouth  daily (Patient taking differently: Take 1 tablet by mouth every evening.) 90 tablet 0  . ticagrelor (BRILINTA) 90 MG TABS tablet Take 1 tablet (90 mg total) by mouth 2 (two) times daily. 60 tablet 0   No current facility-administered medications for this visit.    Allergies:   Review of patient's allergies indicates no known allergies.    Social History:  The patient  reports that he quit smoking about 4 years ago. His smoking use included Cigarettes. He quit after 15 years of use. He has never used smokeless tobacco. He reports that he does not drink alcohol or use illicit drugs.   Family History:  The patient's family history  includes Alcohol abuse in an other family member; Cancer in his mother; Coronary artery disease in an other family member; Deep vein thrombosis in his brother, father, and mother; Diabetes in his brother and father; Heart attack in his brother and father; Heart disease in his brother, father, and mother; Hyperlipidemia in his brother, father, mother, and another family member; Hypertension in his brother, father, and mother; Peripheral vascular disease in his brother, father, and mother; Stroke in his father and another family member. There is no history of Other.    ROS:   Please see the history of present illness.   Review of Systems  Cardiovascular: Positive for dyspnea on exertion.  Gastrointestinal: Positive for abdominal pain.  Genitourinary: Positive for incomplete emptying.  Neurological: Positive for dizziness.  All other systems reviewed and are negative.    PHYSICAL EXAM: VS:  BP 122/52 mmHg  Pulse 78  Ht 5\' 10"  (1.778 m)  Wt 207 lb 9.6 oz (94.167 kg)  BMI 29.79 kg/m2    Wt Readings from Last 3 Encounters:  05/29/15 207 lb 9.6 oz (94.167 kg)  05/18/15 206 lb 14.4 oz (93.849 kg)  12/12/14 207 lb (93.895 kg)     GEN: Well nourished, well developed, in  no acute distress HEENT: normal Neck: no JVD,   no masses Cardiac:  Normal S1/S2, RRR; no murmur ,  no rubs or gallops, no edema;  R groin without hematoma or bruit  Respiratory:  clear to auscultation bilaterally, no wheezing, rhonchi or rales. GI: soft, nontender, nondistended, + BS MS: no deformity or atrophy Skin: warm and dry  Neuro:  CNs II-XII intact, Strength and sensation are intact Psych: Normal affect   EKG:  EKG is ordered today.  It demonstrates:   NSR, HR 67, normal axis, T-wave inversions in 1, aVL, V6   Recent Labs: 06/30/2014: ALT 21 05/16/2015: Hemoglobin 14.5; Magnesium 1.9; Platelets 235; TSH 5.493* 05/17/2015: BUN 19; Creatinine, Ser 1.82*; Potassium 3.6; Sodium 136    Lipid Panel    Component Value Date/Time   CHOL 109 05/16/2015 0228   TRIG 128 05/16/2015 0228   HDL 35* 05/16/2015 0228   CHOLHDL 3.1 05/16/2015 0228   VLDL 26 05/16/2015 0228   LDLCALC 48 05/16/2015 0228      ASSESSMENT AND PLAN:  Coronary artery disease involving native coronary artery of native heart without angina pectoris:  Doing well since DC from the hospital with lateral STEMI tx with POBA to the LAD.  He is having trouble affording Brilinta.  He did not have a stent placed. I will have him start Plavix after he completes 30 days of Brilinta.  He has had some mild orthostatic intolerance with standing abruptly.  However, this is rare.  Continue beta-blocker, ASA, statin.  Would like to get him on an ACE inhibitor or angiotensin receptor blocker.  However with his symptoms of dizziness with standing and normal BP, will hold off for now. Consider adding one of these agents at FU (previously took Losartan).  He does not think his insurance will cover cardiac rehab. I have encouraged him to keep walking if he is not able to go to [facility].    Essential hypertension:  Controlled.    Hyperlipidemia:  Continue high intensity statin.  Check Lipids and LFTs in 6 weeks.   Carotid stenosis, bilateral:   Followed by [facility].  Continue ASA, statin.   Chronic Kidney Disease: Creatinine remained stable post catheterization.   Medication Changes: Current medicines are reviewed at length  with the patient today.  Concerns regarding medicines are as outlined above.  The following changes have been made:   Discontinued Medications   ALLOPURINOL (ZYLOPRIM) 100 MG TABLET    TAKE 1 TABLET BY MOUTH DAILY FOR THE FIRST WEEK, THEN TAKE 2 TABLETS BY MOUTH DAILY.   Modified Medications   No medications on file   New Prescriptions   No medications on file     Labs/ tests ordered today include:   No orders of the defined types were placed in this encounter.     Disposition:   FU with Dr. Kirk Ruths 8 weeks.    Signed, Versie Starks, MHS 05/29/2015 8:15 AM    [facility] [address], [place], [place]  [address] Phone: [phone]; Fax: [phone]

## 2015-05-29 ENCOUNTER — Encounter: Payer: Self-pay | Admitting: Physician Assistant

## 2015-05-29 ENCOUNTER — Ambulatory Visit (INDEPENDENT_AMBULATORY_CARE_PROVIDER_SITE_OTHER): Payer: Medicare Other | Admitting: Physician Assistant

## 2015-05-29 VITALS — BP 122/52 | HR 78 | Ht 70.0 in | Wt 207.6 lb

## 2015-05-29 DIAGNOSIS — I6523 Occlusion and stenosis of bilateral carotid arteries: Secondary | ICD-10-CM | POA: Diagnosis not present

## 2015-05-29 DIAGNOSIS — E785 Hyperlipidemia, unspecified: Secondary | ICD-10-CM

## 2015-05-29 DIAGNOSIS — I10 Essential (primary) hypertension: Secondary | ICD-10-CM | POA: Diagnosis not present

## 2015-05-29 DIAGNOSIS — I2510 Atherosclerotic heart disease of native coronary artery without angina pectoris: Secondary | ICD-10-CM

## 2015-05-29 DIAGNOSIS — N189 Chronic kidney disease, unspecified: Secondary | ICD-10-CM

## 2015-05-29 MED ORDER — CLOPIDOGREL BISULFATE 75 MG PO TABS: 75.0000 mg | ORAL_TABLET | Freq: Every day | ORAL | Status: DC

## 2015-05-29 NOTE — Patient Instructions (Signed)
Medication Instructions:  1. FINISH YOUR BOTTLE OF BRILINTA ONCE FINISHED SEE # 2  2. START PLAVIX 75 MG 1 TABLET DAILY  Labwork: FASTING LIPID AND LIVER PANEL TO BE DONE IN 8 WEEKS  Testing/Procedures: NONE  Follow-Up: DR. Stanford Breed OR PA/NP AT [facility] IN 6-8 WEEKS  Any Other Special Instructions Will Be Listed Below (If Applicable).

## 2015-06-04 ENCOUNTER — Telehealth: Payer: Self-pay | Admitting: Cardiology

## 2015-06-04 ENCOUNTER — Other Ambulatory Visit: Payer: Self-pay | Admitting: *Deleted

## 2015-06-04 DIAGNOSIS — I2510 Atherosclerotic heart disease of native coronary artery without angina pectoris: Secondary | ICD-10-CM

## 2015-06-04 MED ORDER — CLOPIDOGREL BISULFATE 75 MG PO TABS: 75.0000 mg | ORAL_TABLET | Freq: Every day | ORAL | Status: DC

## 2015-06-04 NOTE — Telephone Encounter (Signed)
Resubmitted Rx, informed patient would resubmit and if any issues let him know.

## 2015-06-04 NOTE — Telephone Encounter (Signed)
New Message      Pt calling stating that Richardson Dopp PA wrote him a prescription for Plavix and his pharmacy [facility] told him that the prescription has to be written by an MD in order for them to fill this. Please call back and advise.

## 2015-06-08 ENCOUNTER — Encounter: Payer: Self-pay | Admitting: Internal Medicine

## 2015-06-08 ENCOUNTER — Ambulatory Visit (INDEPENDENT_AMBULATORY_CARE_PROVIDER_SITE_OTHER): Payer: Medicare Other | Admitting: Internal Medicine

## 2015-06-08 VITALS — BP 130/80 | HR 84 | Temp 98.6°F | Wt 209.0 lb

## 2015-06-08 DIAGNOSIS — N289 Disorder of kidney and ureter, unspecified: Secondary | ICD-10-CM

## 2015-06-08 DIAGNOSIS — I2510 Atherosclerotic heart disease of native coronary artery without angina pectoris: Secondary | ICD-10-CM

## 2015-06-08 DIAGNOSIS — E118 Type 2 diabetes mellitus with unspecified complications: Secondary | ICD-10-CM

## 2015-06-08 DIAGNOSIS — N189 Chronic kidney disease, unspecified: Secondary | ICD-10-CM

## 2015-06-08 DIAGNOSIS — E038 Other specified hypothyroidism: Secondary | ICD-10-CM

## 2015-06-08 DIAGNOSIS — R1030 Lower abdominal pain, unspecified: Secondary | ICD-10-CM

## 2015-06-08 DIAGNOSIS — R1031 Right lower quadrant pain: Secondary | ICD-10-CM

## 2015-06-08 DIAGNOSIS — I25118 Atherosclerotic heart disease of native coronary artery with other forms of angina pectoris: Secondary | ICD-10-CM | POA: Diagnosis not present

## 2015-06-08 LAB — URINALYSIS, ROUTINE W REFLEX MICROSCOPIC
BILIRUBIN URINE: NEGATIVE
Ketones, ur: NEGATIVE
Leukocytes, UA: NEGATIVE
Nitrite: NEGATIVE
Specific Gravity, Urine: 1.015 (ref 1.000–1.030)
Urine Glucose: NEGATIVE
Urobilinogen, UA: 0.2 (ref 0.0–1.0)
WBC, UA: NONE SEEN (ref 0–?)
pH: 6 (ref 5.0–8.0)

## 2015-06-08 LAB — TSH: TSH: 3.85 u[IU]/mL (ref 0.35–4.50)

## 2015-06-08 MED ORDER — CARVEDILOL 25 MG PO TABS: ORAL_TABLET | ORAL | Status: DC

## 2015-06-08 MED ORDER — ALLOPURINOL 100 MG PO TABS: 200.0000 mg | ORAL_TABLET | Freq: Every day | ORAL | Status: DC

## 2015-06-08 MED ORDER — LEVOTHYROXINE SODIUM 75 MCG PO TABS: ORAL_TABLET | ORAL | Status: DC

## 2015-06-08 MED ORDER — ATORVASTATIN CALCIUM 80 MG PO TABS: 80.0000 mg | ORAL_TABLET | Freq: Every day | ORAL | Status: DC

## 2015-06-08 MED ORDER — ISOSORBIDE MONONITRATE ER 60 MG PO TB24: 60.0000 mg | ORAL_TABLET | Freq: Every day | ORAL | Status: DC

## 2015-06-08 MED ORDER — SERTRALINE HCL 25 MG PO TABS: ORAL_TABLET | ORAL | Status: DC

## 2015-06-08 NOTE — Assessment & Plan Note (Signed)
69 y/o with hx of CABG admitted on 6/21 -6/24 with lateral STEMI.  PTCA was performed on the very proximal LAD prior to the diagonal and LAD bifurcation with 95% stenosis being reduced down to 60%. There was unsuccessful attempt at wiring the diagonal vessel due to acute angle takeoff which was occluded at the proximal third segment and had been supplied by the vein graft.    Cardiology recommends medical therapy.  Atorvastatin increased to 80 mg.  I suggest he discuss possibility of adding PCSK9 inhibitor with Dr. Stanford Breed.  His pulse is 84 on carvedilol 25 mg bid. Increase dose to 37.5 mg bid.

## 2015-06-08 NOTE — Assessment & Plan Note (Signed)
A1c is stable. Continue management with diet and exercise.  His renal insufficiency likely related to diabetes

## 2015-06-08 NOTE — Assessment & Plan Note (Signed)
Repeat TSH

## 2015-06-08 NOTE — Progress Notes (Signed)
Pre visit review using our clinic review tool, if applicable. No additional management support is needed unless otherwise documented below in the visit note. 

## 2015-06-08 NOTE — Patient Instructions (Signed)
Discuss starting PCSK9 inhibitor with your cardiologist. Our office will contact you re: Kidney ultrasound results Please contact our office if your groin symptoms do not improve or gets worse.

## 2015-06-08 NOTE — Progress Notes (Signed)
Subjective:    Patient ID: Fernando Combs, male    DOB: 12-24-1945, 69 y.o.   MRN: [id number]  HPI  69 year old white male with history of coronary artery disease, type 2 diabetes hypertension and hyperlipidemia for follow-up. Interval medical history-patient was hospitalized for acute MI 6/21- 6/24  Lateral STEMI.  He underwent cardiac cath.  "PTCA was performed on the very proximal LAD prior to the diagonal and LAD bifurcation with 95% stenosis being reduced down to 60%. There was unsuccessful attempt at wiring the diagonal vessel due to acute angle takeoff which was occluded at the proximal third segment and had been supplied by the vein graft."  Patient has been trying to exercise but reports dyspnea with exertion. Patient is chest pain-free on oral nitrates. Patient's atorvastatin increased to 80 mg.  Type II diabetes-A1c stable at 6.3  Chronic renal insufficiency-creatinine slightly higher at 1.6. Patient has history of kidney stones. Over the last 2 weeks he complains of mild discomfort in his right testicle. He had similar discomfort on the left side one month ago. He denies any urinary complaints.  Hypothyroidism - TSH slightly elevated during hospitalization for MI.  Good medication compliance  Review of Systems Negative for fever or chills, negative for back pain    Past Medical History  Diagnosis Date  . History of myocardial infarction   . Hypertension   . Hyperlipidemia   . CAD (coronary artery disease)     cath 05/16/2015 patent LIMA to LAD, SVG to OM, SVG to RPDA, SVG to diagonal was occluded. PTCA 95% prox LAD reduce down to 60%. Medical management  . Diabetic peripheral neuropathy   . Benign prostatic hypertrophy   . Diabetes mellitus type II   . History of tobacco abuse   . History of syncope     most likely vasovagal  . Thrombus 04/2008    acute thrombus of his right superficial artery with claudication  . Diabetes, polyneuropathy   . Colitis, ischemic 06/2005       history of  . DVT (deep venous thrombosis)   . Myocardial infarction   . Peripheral vascular disease   . Carotid artery occlusion     History   Social History  . Marital Status: Married    Spouse Name: N/A  . Number of Children: N/A  . Years of Education: N/A   Occupational History  . Not on file.   Social History Main Topics  . Smoking status: Former Smoker -- 15 years    Types: Cigarettes    Quit date: 04/14/2011  . Smokeless tobacco: Never Used  . Alcohol Use: No  . Drug Use: No  . Sexual Activity: Not on file   Other Topics Concern  . Not on file   Social History Narrative   Occupation:  Works at [employer] - lives with wife (2nd marriage)   3 children     Alcohol use-no   Tobacco Use - Yes.           Past Surgical History  Procedure Laterality Date  . Other surgical history  11/2010    angiogram, right leg stent placement-  -Dr Kellie Simmering  . Peripheral vascular surgery    . Pr vein bypass graft,aorto-fem-pop  08/04/00  . Coronary artery bypass graft  08/04/00    times 4 (left interanl mammary artery to the left anterior descending, saphenous vein,graft to diagonal, saphenous vein graft to obtuse marginal, spahenous vein graft to posterior descending-Surgeon  Collier Salina  Lucianne Lei Tright III  . Lower extremity angiogram  March 09, 2012  . Lower extremity angiogram Right 03/09/2012    Procedure: LOWER EXTREMITY ANGIOGRAM;  Surgeon: Serafina Mitchell, MD;  Location: [facility] CATH LAB;  Service: Cardiovascular;  Laterality: Right;  . Cardiac catheterization N/A 05/15/2015    Procedure: Left Heart Cath and Cors/Grafts Angiography;  Surgeon: Troy Sine, MD;  Location: [facility];  Service: Cardiovascular;  Laterality: N/A;    Family History  Problem Relation Age of Onset  . Alcohol abuse    . Stroke    . Coronary artery disease    . Hyperlipidemia    . Other Neg Hx     denies family history of blood clots or blood disorder  . Cancer Mother   . Deep vein  thrombosis Mother     Varicose Veins  . Heart disease Mother   . Hyperlipidemia Mother   . Hypertension Mother   . Peripheral vascular disease Mother   . Stroke Father   . Deep vein thrombosis Father   . Diabetes Father   . Heart disease Father     Heart Disease  before age 83  . Hyperlipidemia Father   . Hypertension Father   . Heart attack Father   . Peripheral vascular disease Father   . Deep vein thrombosis Brother   . Diabetes Brother   . Heart disease Brother     Heart Disease before age 8  . Hyperlipidemia Brother   . Hypertension Brother   . Heart attack Brother   . Peripheral vascular disease Brother     No Known Allergies  Current Outpatient Prescriptions on File Prior to Visit  Medication Sig Dispense Refill  . allopurinol (ZYLOPRIM) 100 MG tablet Take 200 mg by mouth daily.    Marland Kitchen aspirin 81 MG chewable tablet Chew 1 tablet (81 mg total) by mouth daily.    Marland Kitchen atorvastatin (LIPITOR) 80 MG tablet Take 1 tablet (80 mg total) by mouth daily at 6 PM. 30 tablet 11  . carvedilol (COREG) 25 MG tablet Take 1 tablet by mouth two  times daily with a meal 180 tablet 0  . clopidogrel (PLAVIX) 75 MG tablet Take 1 tablet (75 mg total) by mouth daily. DO NOT START UNTIL YOU FINISH BRILINTA 90 tablet 3  . colchicine 0.6 MG tablet Take 0.6 mg by mouth daily as needed. For gout    . isosorbide mononitrate (IMDUR) 60 MG 24 hr tablet Take 1 tablet (60 mg total) by mouth daily. 30 tablet 11  . levothyroxine (SYNTHROID, LEVOTHROID) 75 MCG tablet Take 1 tablet by mouth  daily 90 tablet 0  . nitroGLYCERIN (NITROSTAT) 0.4 MG SL tablet Place 1 tablet (0.4 mg total) under the tongue every 5 (five) minutes x 3 doses as needed for chest pain. 25 tablet 3  . sertraline (ZOLOFT) 25 MG tablet Take 1 tablet by mouth  daily (Patient taking differently: Take 1 tablet by mouth every evening.) 90 tablet 0  . ticagrelor (BRILINTA) 90 MG TABS tablet Take 1 tablet (90 mg total) by mouth 2 (two) times daily.  60 tablet 0   No current facility-administered medications on file prior to visit.    BP 130/80 mmHg  Pulse 84  Temp(Src) 98.6 F (37 C) (Oral)  Wt 209 lb (94.802 kg)  Lab Results  Component Value Date   HGBA1C 6.3* 05/16/2015   HGBA1C 6.2 06/30/2014   HGBA1C 6.2 01/30/2014   Lab Results  Component Value  Date   MICROALBUR 43.9* 06/30/2014   LDLCALC 48 05/16/2015   CREATININE 1.82* 05/17/2015   Lab Results  Component Value Date   NA 136 05/17/2015   K 3.6 05/17/2015   CL 101 05/17/2015   CO2 24 05/17/2015   Lab Results  Component Value Date   CREATININE 1.82* 05/17/2015   Lab Results  Component Value Date   TSH 5.493* 05/16/2015     Objective:   Physical Exam  Constitutional: He is oriented to person, place, and time. He appears well-developed and well-nourished. No distress.  HENT:  Head: Normocephalic and atraumatic.  Neck: Neck supple.  No carotid bruit  Cardiovascular: Normal rate, regular rhythm and normal heart sounds.   No murmur heard. Pulmonary/Chest: Effort normal and breath sounds normal. He has no wheezes.  Genitourinary:  Mild right testicular tenderness, no redness, no obvious hernia  Musculoskeletal: He exhibits no edema.  Neurological: He is alert and oriented to person, place, and time.  Skin: Skin is warm and dry.  Psychiatric: He has a normal mood and affect. His behavior is normal.          Assessment & Plan:

## 2015-06-08 NOTE — Assessment & Plan Note (Signed)
Patient complains of mild intermittent right groin pain over the past 2 weeks. He has history of kidney stones. Patient may be in the process of passing small stone. His creatinine is slightly higher.  Obtain UA with microalbumin and renal ultrasound.

## 2015-06-11 ENCOUNTER — Ambulatory Visit
Admission: RE | Admit: 2015-06-11 | Discharge: 2015-06-11 | Disposition: A | Discharge status: Home / Self Care | Payer: Medicare Other | Source: Ambulatory Visit | Attending: Internal Medicine | Admitting: Internal Medicine

## 2015-06-11 DIAGNOSIS — N289 Disorder of kidney and ureter, unspecified: Secondary | ICD-10-CM | POA: Diagnosis not present

## 2015-06-11 DIAGNOSIS — I2510 Atherosclerotic heart disease of native coronary artery without angina pectoris: Secondary | ICD-10-CM

## 2015-06-11 DIAGNOSIS — N189 Chronic kidney disease, unspecified: Secondary | ICD-10-CM | POA: Diagnosis not present

## 2015-06-14 ENCOUNTER — Other Ambulatory Visit: Payer: Self-pay | Admitting: Internal Medicine

## 2015-06-14 MED ORDER — CIPROFLOXACIN HCL 250 MG PO TABS: 250.0000 mg | ORAL_TABLET | Freq: Two times a day (BID) | ORAL | Status: DC

## 2015-07-02 ENCOUNTER — Ambulatory Visit: Payer: Medicare Other | Admitting: Pharmacist

## 2015-07-02 ENCOUNTER — Other Ambulatory Visit: Payer: Self-pay | Admitting: Pharmacist

## 2015-07-02 DIAGNOSIS — E785 Hyperlipidemia, unspecified: Secondary | ICD-10-CM

## 2015-07-03 ENCOUNTER — Other Ambulatory Visit (INDEPENDENT_AMBULATORY_CARE_PROVIDER_SITE_OTHER): Payer: Medicare Other | Admitting: *Deleted

## 2015-07-03 DIAGNOSIS — E785 Hyperlipidemia, unspecified: Secondary | ICD-10-CM | POA: Diagnosis not present

## 2015-07-03 LAB — HEPATIC FUNCTION PANEL
ALBUMIN: 3.9 g/dL (ref 3.5–5.2)
ALT: 15 U/L (ref 0–53)
AST: 14 U/L (ref 0–37)
Alkaline Phosphatase: 76 U/L (ref 39–117)
BILIRUBIN TOTAL: 0.4 mg/dL (ref 0.2–1.2)
Bilirubin, Direct: 0.1 mg/dL (ref 0.0–0.3)
TOTAL PROTEIN: 7 g/dL (ref 6.0–8.3)

## 2015-07-03 LAB — LIPID PANEL
Cholesterol: 89 mg/dL (ref 0–200)
HDL: 32.9 mg/dL — AB (ref >39.00)
LDL CALC: 30 mg/dL (ref 0–99)
NONHDL: 56.11
Total CHOL/HDL Ratio: 3
Triglycerides: 132 mg/dL (ref 0.0–149.0)
VLDL: 26.4 mg/dL (ref 0.0–40.0)

## 2015-07-04 ENCOUNTER — Encounter: Payer: Self-pay | Admitting: *Deleted

## 2015-07-11 ENCOUNTER — Ambulatory Visit: Payer: Medicare Other | Admitting: Internal Medicine

## 2015-07-16 ENCOUNTER — Ambulatory Visit: Payer: Medicare Other | Admitting: Internal Medicine

## 2015-07-25 NOTE — Progress Notes (Signed)
HPI: FU hx of HLD, HTN, DM2, CAD s/p CABG 2001 with LIMA to LAD, SVG to diagonal, SVG to OM, SVG to RPDA, carotid stenosis (CTA 09/2009 with 75% right and 76% left carotid stenosis), CKD.  Admitted 6/16 with a lateral STEMI. LHC showed patent LIMA to LAD, SVG to OM, SVG to RPDA, SVG to diagonal was occluded with severe native D1 disease. He was noted to have 95% proximal LAD stenosis after proximal septal and prior to the takeoff of D1 branch, the diagonal branch was occluded at the proximal third of the vessel, LAD was occluded shortly after the diagonal takeoff, subtotal occlusion of the AV groove circumflex and total occlusion of the native mid RCA after anterior RV marginal branch. PTCA was performed on the very proximal LAD prior to the diagonal and LAD bifurcation with 95% stenosis being reduced down to 60%. There was unsuccessful attempt at wiring the diagonal vessel due to acute angle takeoff which was occluded at the proximal third segment and had been supplied by the vein graft. Medical therapy was recommended. Post PCI, he continued to have chest discomfort which worsened with weaning of IV nitroglycerin. He was placed on oral nitrates with resolution of symptoms. EF on left ventriculogram during cardiac catheterization was 35-40%. Follow-up echo demonstrated EF 50-55% with possible posterior lateral HK, mild MR, mild LAE.  Since last seen, the patient denies any dyspnea on exertion, orthopnea, PND, pedal edema, palpitations, syncope or chest pain.    Studies/Reports Reviewed Today:  LHC 05/16/15 LAD: prox 95%, mid 100%, D1 100% LCx: 100% RCA: Mid 100% S-RPDA ok Free LIMA-LAD: Ok S-OM3: ok S-D1: 100% EF: 35-40% PCI: PTCA of the very proximal LAD prior to the diagonal/LAD bifurcation with a 95+ percent stenosis being reduced to 60% and unsuccessful attempt at wiring the diagonal vessel due to the acute angle takeoff which was occluded in its proximal third segment and  had been supplied by the vein graft.  Echo 05/16/15 - Left ventricle: Poor endocardial definition Possible posterior lateral hypokinesis on definity images. The cavity size was mildly dilated. There was moderate concentric hypertrophy. Systolic function was normal. The estimated ejection fraction was in the range of 50% to 55%. - Mitral valve: There was mild regurgitation. - Left atrium: The atrium was mildly dilated. - Atrial septum: No defect or patent foramen ovale was identified.  Carotid US 12/12/14 R 40-59% L < 40%  LE Art Duplex 12/12/14 R 0.9; L 1.0  Current Outpatient Prescriptions  Medication Sig Dispense Refill  . allopurinol (ZYLOPRIM) 100 MG tablet Take 2 tablets (200 mg total) by mouth daily. 90 tablet 1  . aspirin 81 MG chewable tablet Chew 1 tablet (81 mg total) by mouth daily.    Marland Kitchen atorvastatin (LIPITOR) 80 MG tablet Take 1 tablet (80 mg total) by mouth daily at 6 PM. 90 tablet 1  . carvedilol (COREG) 25 MG tablet Take 25 mg by mouth 2 (two) times daily with a meal.    . clopidogrel (PLAVIX) 75 MG tablet Take 1 tablet (75 mg total) by mouth daily. DO NOT START UNTIL YOU FINISH BRILINTA 90 tablet 3  . isosorbide mononitrate (IMDUR) 60 MG 24 hr tablet Take 1 tablet (60 mg total) by mouth daily. 90 tablet 1  . levothyroxine (SYNTHROID, LEVOTHROID) 75 MCG tablet Take 1 tablet by mouth  daily 90 tablet 1  . nitroGLYCERIN (NITROSTAT) 0.4 MG SL tablet Place 1 tablet (0.4 mg total) under the tongue every 5 (  five) minutes x 3 doses as needed for chest pain. 25 tablet 3  . sertraline (ZOLOFT) 25 MG tablet Take 1 tablet by mouth  daily 90 tablet 1   No current facility-administered medications for this visit.     Past Medical History  Diagnosis Date  . History of myocardial infarction   . Hypertension   . Hyperlipidemia   . CAD (coronary artery disease)     cath 05/16/2015 patent LIMA to LAD, SVG to OM, SVG to RPDA, SVG to diagonal was occluded. PTCA 95% prox LAD  reduce down to 60%. Medical management  . Diabetic peripheral neuropathy   . Benign prostatic hypertrophy   . Diabetes mellitus type II   . History of tobacco abuse   . History of syncope     most likely vasovagal  . Thrombus 04/2008    acute thrombus of his right superficial artery with claudication  . Diabetes, polyneuropathy   . Colitis, ischemic 06/2005     history of  . DVT (deep venous thrombosis)   . Myocardial infarction   . Peripheral vascular disease   . Carotid artery occlusion     Past Surgical History  Procedure Laterality Date  . Other surgical history  11/2010    angiogram, right leg stent placement-  -Dr Kellie Simmering  . Peripheral vascular surgery    . Pr vein bypass graft,aorto-fem-pop  08/04/00  . Coronary artery bypass graft  08/04/00    times 4 (left interanl mammary artery to the left anterior descending, saphenous vein,graft to diagonal, saphenous vein graft to obtuse marginal, spahenous vein graft to posterior descending-Surgeon  Tharon Aquas Tright III  . Lower extremity angiogram  March 09, 2012  . Lower extremity angiogram Right 03/09/2012    Procedure: LOWER EXTREMITY ANGIOGRAM;  Surgeon: Serafina Mitchell, MD;  Location: [facility] CATH LAB;  Service: Cardiovascular;  Laterality: Right;  . Cardiac catheterization N/A 05/15/2015    Procedure: Left Heart Cath and Cors/Grafts Angiography;  Surgeon: Troy Sine, MD;  Location: [facility] CV LAB;  Service: Cardiovascular;  Laterality: N/A;    Social History   Social History  . Marital Status: Married    Spouse Name: N/A  . Number of Children: N/A  . Years of Education: N/A   Occupational History  . Not on file.   Social History Main Topics  . Smoking status: Former Smoker -- 15 years    Types: Cigarettes    Quit date: 04/14/2011  . Smokeless tobacco: Never Used  . Alcohol Use: No  . Drug Use: No  . Sexual Activity: Not on file   Other Topics Concern  . Not on file   Social History Narrative   Occupation:   Works at [employer] - lives with wife (2nd marriage)   3 children     Alcohol use-no   Tobacco Use - Yes.           ROS: no fevers or chills, productive cough, hemoptysis, dysphasia, odynophagia, melena, hematochezia, dysuria, hematuria, rash, seizure activity, orthopnea, PND, pedal edema, claudication. Remaining systems are negative.  Physical Exam: Well-developed well-nourished in no acute distress.  Skin is warm and dry.  HEENT is normal.  Neck is supple.  Chest is clear to auscultation with normal expansion.  Cardiovascular exam is regular rate and rhythm.  Abdominal exam nontender or distended. No masses palpated. Extremities show no edema. neuro grossly intact

## 2015-07-27 ENCOUNTER — Encounter: Payer: Self-pay | Admitting: Cardiology

## 2015-07-27 ENCOUNTER — Ambulatory Visit (INDEPENDENT_AMBULATORY_CARE_PROVIDER_SITE_OTHER): Payer: Medicare Other | Admitting: Cardiology

## 2015-07-27 VITALS — BP 160/70 | HR 60 | Ht 70.0 in | Wt 209.8 lb

## 2015-07-27 DIAGNOSIS — E785 Hyperlipidemia, unspecified: Secondary | ICD-10-CM

## 2015-07-27 DIAGNOSIS — N183 Chronic kidney disease, stage 3 unspecified: Secondary | ICD-10-CM

## 2015-07-27 DIAGNOSIS — I10 Essential (primary) hypertension: Secondary | ICD-10-CM

## 2015-07-27 MED ORDER — LOSARTAN POTASSIUM 50 MG PO TABS: 50.0000 mg | ORAL_TABLET | Freq: Every day | ORAL | Status: DC

## 2015-07-27 NOTE — Assessment & Plan Note (Signed)
Blood pressure is elevated in recent evaluation showed mild to moderate reduction in LV function. Plan to resume Cozaar 50 mg daily which he was on prior to his recent infarct. Note he does have renal insufficiency and we will need to be careful with this. I will check his renal function in 5 days.

## 2015-07-27 NOTE — Assessment & Plan Note (Addendum)
Continue aspirin and statin. Continue Plavix for 1 year following infarct.

## 2015-07-27 NOTE — Assessment & Plan Note (Signed)
Cerebrovascular disease and peripheral vascular disease followed by vascular surgery. Continue aspirin and statin.

## 2015-07-27 NOTE — Assessment & Plan Note (Signed)
Check potassium and renal function in 5 days after reinitiating Cozaar.Marland Kitchen

## 2015-07-27 NOTE — Assessment & Plan Note (Signed)
Continue statin. 

## 2015-07-27 NOTE — Patient Instructions (Signed)
Your physician wants you to follow-up in: [place] will receive a reminder letter in the mail two months in advance. If you don't receive a letter, please call our office to schedule the follow-up appointment.   START LOSARTAN 50 MG ONCE DAILY  Your physician recommends that you return for lab work Wednesday NEXT WEEK

## 2015-08-10 ENCOUNTER — Ambulatory Visit: Payer: Medicare Other | Admitting: Internal Medicine

## 2015-08-30 DIAGNOSIS — I10 Essential (primary) hypertension: Secondary | ICD-10-CM | POA: Diagnosis not present

## 2015-08-31 LAB — BASIC METABOLIC PANEL
BUN: 27 mg/dL — ABNORMAL HIGH (ref 7–25)
CHLORIDE: 103 mmol/L (ref 98–110)
CO2: 26 mmol/L (ref 20–31)
Calcium: 9.8 mg/dL (ref 8.6–10.3)
Creat: 1.6 mg/dL — ABNORMAL HIGH (ref 0.70–1.18)
Glucose, Bld: 120 mg/dL — ABNORMAL HIGH (ref 65–99)
Potassium: 4.4 mmol/L (ref 3.5–5.3)
SODIUM: 138 mmol/L (ref 135–146)

## 2015-10-12 ENCOUNTER — Other Ambulatory Visit: Payer: Self-pay | Admitting: Internal Medicine

## 2015-11-14 ENCOUNTER — Other Ambulatory Visit: Payer: Self-pay | Admitting: Internal Medicine

## 2015-11-21 ENCOUNTER — Other Ambulatory Visit: Payer: Self-pay | Admitting: Internal Medicine

## 2015-11-30 ENCOUNTER — Other Ambulatory Visit: Payer: Self-pay | Admitting: Internal Medicine

## 2015-11-30 LAB — FECAL OCCULT BLOOD, GUAIAC: FECAL OCCULT BLD: NEGATIVE

## 2015-12-05 ENCOUNTER — Ambulatory Visit (INDEPENDENT_AMBULATORY_CARE_PROVIDER_SITE_OTHER): Payer: Medicare Other | Admitting: Family Medicine

## 2015-12-05 ENCOUNTER — Encounter: Payer: Self-pay | Admitting: Family Medicine

## 2015-12-05 VITALS — BP 128/80 | HR 72 | Temp 97.7°F | Ht 70.0 in | Wt 220.7 lb

## 2015-12-05 DIAGNOSIS — F3342 Major depressive disorder, recurrent, in full remission: Secondary | ICD-10-CM

## 2015-12-05 DIAGNOSIS — Z23 Encounter for immunization: Secondary | ICD-10-CM

## 2015-12-05 DIAGNOSIS — E039 Hypothyroidism, unspecified: Secondary | ICD-10-CM

## 2015-12-05 DIAGNOSIS — E785 Hyperlipidemia, unspecified: Secondary | ICD-10-CM | POA: Diagnosis not present

## 2015-12-05 DIAGNOSIS — I10 Essential (primary) hypertension: Secondary | ICD-10-CM

## 2015-12-05 DIAGNOSIS — E118 Type 2 diabetes mellitus with unspecified complications: Secondary | ICD-10-CM | POA: Diagnosis not present

## 2015-12-05 DIAGNOSIS — I739 Peripheral vascular disease, unspecified: Secondary | ICD-10-CM

## 2015-12-05 LAB — HEMOGLOBIN A1C: HEMOGLOBIN A1C: 6.3 % (ref 4.6–6.5)

## 2015-12-05 LAB — TSH: TSH: 5.49 u[IU]/mL — AB (ref 0.35–4.50)

## 2015-12-05 MED ORDER — CARVEDILOL 25 MG PO TABS: 25.0000 mg | ORAL_TABLET | Freq: Two times a day (BID) | ORAL | Status: DC

## 2015-12-05 MED ORDER — AMLODIPINE BESYLATE 10 MG PO TABS: 10.0000 mg | ORAL_TABLET | Freq: Every day | ORAL | Status: DC

## 2015-12-05 NOTE — Patient Instructions (Addendum)
BEFORE YOU LEAVE: -labs -flu and PPSV23 vaccines -schedule wellness visit with PCP in 3-4 months  -Schedule a diabetic eye exam every year  -We have ordered labs or studies at this visit. It can take up to 1-2 weeks for results and processing. We will contact you with instructions IF your results are abnormal. Normal results will be released to your [facility]. If you have not heard from [place] or can not find your results in [facility] in 2 weeks please contact our office.  -see your podiatrist for a visit  We recommend the following healthy lifestyle measures: - eat a healthy whole foods diet consisting of regular small meals composed of vegetables, fruits, beans, nuts, seeds, healthy meats such as white chicken and fish and whole grains.  - avoid sweets, white starchy foods, fried foods, fast food, processed foods, sodas, red meet and other fattening foods.  - get a least 150-300 minutes of aerobic exercise per week.    Taper off zoloft as we discussed per your wishes and follow up with your doctor if any concerns.

## 2015-12-05 NOTE — Progress Notes (Signed)
HPI: Fernando Combs is a pleasant 70, pt of Dr. Shawna Orleans with a PMH significant for HTN, HLD, Depression, gout, hypothyroidism, DM2, CAD and CKD, here for medication refills. Reports coreg was sent incorrectly - he takes bid. Cards notes last visit were 25 bid.  Requests refill on norvasc. Reports take zoloft 25 mg daily but wants to stop as feels fine without sig stressors now present. Per chart review he sees his cardiologist on a regular basis. His cardiology restarted Cozaar a few months ago. His cardiologist prescribes his plavix. He is due for a TSH and Hgba1c check. Other labs done with cardiologist recently. He is also due for a preventive visit. He is frustrated he is frequently unable to see his PCP per his reports and questions me on whether or not Dr. Shawna Orleans is ok and if he will be available on a regular basis moving forward.He denies:CP, SOB, DOE, swelling, vision changes, hypoglycemia. Reports he see podiatrist for foot care. ROS: See pertinent positives and negatives per HPI.  Past Medical History  Diagnosis Date  . History of myocardial infarction   . Hypertension   . Hyperlipidemia   . CAD (coronary artery disease)     cath 05/16/2015 patent LIMA to LAD, SVG to OM, SVG to RPDA, SVG to diagonal was occluded. PTCA 95% prox LAD reduce down to 60%. Medical management  . Diabetic peripheral neuropathy ([place])   . Benign prostatic hypertrophy   . Diabetes mellitus type II   . History of tobacco abuse   . History of syncope     most likely vasovagal  . Thrombus 04/2008    acute thrombus of his right superficial artery with claudication  . Diabetes, polyneuropathy ([place])   . Colitis, ischemic ([place]) 06/2005     history of  . DVT (deep venous thrombosis) ([place])   . Myocardial infarction ([place])   . Peripheral vascular disease ([place])   . Carotid artery occlusion     Past Surgical History  Procedure Laterality Date  . Other surgical history  11/2010    angiogram, right leg stent placement-  -Dr  Kellie Simmering  . Peripheral vascular surgery    . Pr vein bypass graft,aorto-fem-pop  08/04/00  . Coronary artery bypass graft  08/04/00    times 4 (left interanl mammary artery to the left anterior descending, saphenous vein,graft to diagonal, saphenous vein graft to obtuse marginal, spahenous vein graft to posterior descending-Surgeon  Tharon Aquas Tright III  . Lower extremity angiogram  March 09, 2012  . Lower extremity angiogram Right 03/09/2012    Procedure: LOWER EXTREMITY ANGIOGRAM;  Surgeon: Serafina Mitchell, MD;  Location: [facility] CATH LAB;  Service: Cardiovascular;  Laterality: Right;  . Cardiac catheterization N/A 05/15/2015    Procedure: Left Heart Cath and Cors/Grafts Angiography;  Surgeon: Troy Sine, MD;  Location: [place] CV LAB;  Service: Cardiovascular;  Laterality: N/A;    Family History  Problem Relation Age of Onset  . Alcohol abuse    . Stroke    . Coronary artery disease    . Hyperlipidemia    . Other Neg Hx     denies family history of blood clots or blood disorder  . Cancer Mother   . Deep vein thrombosis Mother     Varicose Veins  . Heart disease Mother   . Hyperlipidemia Mother   . Hypertension Mother   . Peripheral vascular disease Mother   . Stroke Father   . Deep vein thrombosis Father   .  Diabetes Father   . Heart disease Father     Heart Disease  before age 71  . Hyperlipidemia Father   . Hypertension Father   . Heart attack Father   . Peripheral vascular disease Father   . Deep vein thrombosis Brother   . Diabetes Brother   . Heart disease Brother     Heart Disease before age 1  . Hyperlipidemia Brother   . Hypertension Brother   . Heart attack Brother   . Peripheral vascular disease Brother     Social History   Social History  . Marital Status: Married    Spouse Name: N/A  . Number of Children: N/A  . Years of Education: N/A   Social History Main Topics  . Smoking status: Former Smoker -- 15 years    Types: Cigarettes    Quit date:  04/14/2011  . Smokeless tobacco: Never Used  . Alcohol Use: No  . Drug Use: No  . Sexual Activity: Not Asked   Other Topics Concern  . None   Social History Narrative   Occupation:  Works at [employer] - lives with wife (2nd marriage)   3 children     Alcohol use-no   Tobacco Use - Yes.            Current outpatient prescriptions:  .  allopurinol (ZYLOPRIM) 100 MG tablet, Take 2 tablets by mouth  daily, Disp: 60 tablet, Rfl: 2 .  amLODipine (NORVASC) 10 MG tablet, Take 1 tablet (10 mg total) by mouth daily., Disp: 90 tablet, Rfl: 1 .  aspirin 81 MG chewable tablet, Chew 1 tablet (81 mg total) by mouth daily., Disp: , Rfl:  .  atorvastatin (LIPITOR) 80 MG tablet, TAKE 1 TABLET BY MOUTH  DAILY AT 6 PM., Disp: 90 tablet, Rfl: 1 .  carvedilol (COREG) 25 MG tablet, Take 1 tablet (25 mg total) by mouth 2 (two) times daily with a meal., Disp: 180 tablet, Rfl: 1 .  clopidogrel (PLAVIX) 75 MG tablet, Take 1 tablet (75 mg total) by mouth daily. DO NOT START UNTIL YOU FINISH BRILINTA, Disp: 90 tablet, Rfl: 3 .  isosorbide mononitrate (IMDUR) 60 MG 24 hr tablet, Take 1 tablet by mouth  daily, Disp: 90 tablet, Rfl: 1 .  levothyroxine (SYNTHROID, LEVOTHROID) 75 MCG tablet, Take 1 tablet by mouth  daily, Disp: 30 tablet, Rfl: 0 .  losartan (COZAAR) 50 MG tablet, Take 1 tablet (50 mg total) by mouth daily., Disp: 90 tablet, Rfl: 3 .  nitroGLYCERIN (NITROSTAT) 0.4 MG SL tablet, Place 1 tablet (0.4 mg total) under the tongue every 5 (five) minutes x 3 doses as needed for chest pain., Disp: 25 tablet, Rfl: 3 .  sertraline (ZOLOFT) 25 MG tablet, Take 1 tablet by mouth  daily, Disp: 90 tablet, Rfl: 1  EXAM:  Filed Vitals:   12/05/15 1050  BP: 128/80  Pulse: 72  Temp: 97.7 F (36.5 C)    Body mass index is 31.67 kg/(m^2).  GENERAL: vitals reviewed and listed above, alert, oriented, appears well hydrated and in no acute distress  HEENT: atraumatic, conjunttiva clear, no obvious  abnormalities on inspection of external nose and ears  NECK: no obvious masses on inspection  LUNGS: clear to auscultation bilaterally, no wheezes, rales or rhonchi, good air movement  CV: HRRR, no peripheral edema  MS: moves all extremities without noticeable abnormality  PSYCH: pleasant and cooperative, no obvious depression or anxiety  FOOT EXAM: done - see chart  ASSESSMENT AND PLAN:  Discussed the following assessment and plan:  Type 2 diabetes mellitus with complication, without long-term current use of insulin (HCC) - Plan: Hemoglobin A1c  Hypothyroidism, unspecified hypothyroidism type - Plan: TSH  Hyperlipidemia  Recurrent major depressive disorder, in full remission ([place])  Essential hypertension  UNSPECIFIED PERIPHERAL VASCULAR DISEASE  -tsh and hgba1c -med refills per request - opted on coreg bid since cozaar now added and he is to monitor and follow up with his cardiologist if BP running high  -he wants to taper of zoloft, on low dose, discussed slow taper -flu vaccine and ppsv23 offered -foot exam done -advised to schedule eye exam -advised to schedule preventive visit with PCP -Patient advised to return or notify a doctor immediately if symptoms worsen or persist or new concerns arise.  Patient Instructions  BEFORE YOU LEAVE: -labs -flu and PPSV23 vaccines -schedule wellness visit with PCP in 3-4 months  -Schedule a diabetic eye exam every year  -We have ordered labs or studies at this visit. It can take up to 1-2 weeks for results and processing. We will contact you with instructions IF your results are abnormal. Normal results will be released to your [facility]. If you have not heard from [place] or can not find your results in [facility] in 2 weeks please contact our office.  -see your podiatrist for a visit  We recommend the following healthy lifestyle measures: - eat a healthy whole foods diet consisting of regular small meals composed of vegetables,  fruits, beans, nuts, seeds, healthy meats such as white chicken and fish and whole grains.  - avoid sweets, white starchy foods, fried foods, fast food, processed foods, sodas, red meet and other fattening foods.  - get a least 150-300 minutes of aerobic exercise per week.    Taper off zoloft as we discussed per your wishes and follow up with your doctor if any concerns.     Colin Benton R.

## 2015-12-05 NOTE — Addendum Note (Signed)
Addended by: Agnes Lawrence on: 12/05/2015 11:48 AM   Modules accepted: Orders

## 2015-12-05 NOTE — Progress Notes (Signed)
Pre visit review using our clinic review tool, if applicable. No additional management support is needed unless otherwise documented below in the visit note. 

## 2015-12-10 ENCOUNTER — Encounter: Payer: Self-pay | Admitting: Internal Medicine

## 2015-12-11 ENCOUNTER — Encounter: Payer: Self-pay | Admitting: Family

## 2015-12-14 ENCOUNTER — Ambulatory Visit (INDEPENDENT_AMBULATORY_CARE_PROVIDER_SITE_OTHER)
Admission: RE | Admit: 2015-12-14 | Discharge: 2015-12-14 | Disposition: A | Discharge status: Home / Self Care | Payer: Medicare Other | Source: Ambulatory Visit | Attending: Family | Admitting: Family

## 2015-12-14 ENCOUNTER — Ambulatory Visit (HOSPITAL_COMMUNITY)
Admission: RE | Admit: 2015-12-14 | Discharge: 2015-12-14 | Disposition: A | Discharge status: Home / Self Care | Payer: Medicare Other | Source: Ambulatory Visit | Attending: Family | Admitting: Family

## 2015-12-14 ENCOUNTER — Other Ambulatory Visit: Payer: Self-pay | Admitting: Family

## 2015-12-14 DIAGNOSIS — I70201 Unspecified atherosclerosis of native arteries of extremities, right leg: Secondary | ICD-10-CM | POA: Insufficient documentation

## 2015-12-14 DIAGNOSIS — I739 Peripheral vascular disease, unspecified: Secondary | ICD-10-CM

## 2015-12-14 DIAGNOSIS — Z95820 Peripheral vascular angioplasty status with implants and grafts: Secondary | ICD-10-CM | POA: Diagnosis not present

## 2015-12-14 DIAGNOSIS — Z87891 Personal history of nicotine dependence: Secondary | ICD-10-CM | POA: Insufficient documentation

## 2015-12-14 DIAGNOSIS — E785 Hyperlipidemia, unspecified: Secondary | ICD-10-CM | POA: Diagnosis not present

## 2015-12-14 DIAGNOSIS — I6523 Occlusion and stenosis of bilateral carotid arteries: Secondary | ICD-10-CM | POA: Insufficient documentation

## 2015-12-14 DIAGNOSIS — Z959 Presence of cardiac and vascular implant and graft, unspecified: Secondary | ICD-10-CM

## 2015-12-14 DIAGNOSIS — E119 Type 2 diabetes mellitus without complications: Secondary | ICD-10-CM | POA: Diagnosis not present

## 2015-12-14 DIAGNOSIS — I10 Essential (primary) hypertension: Secondary | ICD-10-CM | POA: Insufficient documentation

## 2015-12-18 ENCOUNTER — Encounter: Payer: Self-pay | Admitting: Family

## 2015-12-18 ENCOUNTER — Ambulatory Visit (INDEPENDENT_AMBULATORY_CARE_PROVIDER_SITE_OTHER): Payer: Medicare Other | Admitting: Family

## 2015-12-18 VITALS — BP 92/53 | HR 69 | Temp 97.4°F | Resp 14 | Ht 70.0 in | Wt 216.0 lb

## 2015-12-18 DIAGNOSIS — Z959 Presence of cardiac and vascular implant and graft, unspecified: Secondary | ICD-10-CM

## 2015-12-18 DIAGNOSIS — I739 Peripheral vascular disease, unspecified: Secondary | ICD-10-CM

## 2015-12-18 DIAGNOSIS — Z95820 Peripheral vascular angioplasty status with implants and grafts: Secondary | ICD-10-CM

## 2015-12-18 DIAGNOSIS — I6523 Occlusion and stenosis of bilateral carotid arteries: Secondary | ICD-10-CM

## 2015-12-18 DIAGNOSIS — Z4889 Encounter for other specified surgical aftercare: Secondary | ICD-10-CM

## 2015-12-18 DIAGNOSIS — Z48812 Encounter for surgical aftercare following surgery on the circulatory system: Secondary | ICD-10-CM

## 2015-12-18 DIAGNOSIS — Z87891 Personal history of nicotine dependence: Secondary | ICD-10-CM

## 2015-12-18 NOTE — Progress Notes (Signed)
[facility] [place] HISTORY AND PHYSICAL   MRN : [id number]  History of Present Illness:   Fernando Combs is a 70 y.o. male patient of Dr. Kellie Simmering who underwent atherectomy and angioplasty of the stent in the right SFA in April 2013. The patient is status post right SFA to popliteal thrombectomy in 2009 with the placement of the right SFA stent in January 2012.  He also has a history of mild carotid artery stenosis. He returns today for yearly follow up.  He retired in 2015. He has claudication in both calves after walking about 15 minutes, relieved with rest,  denies non-healing wounds. Denies ever having a stroke or TIA symptoms. He denies tingling, numbness, pain, or weakness in either UE, denies dizziness.  He reports burning and numbness in all toes, states this has worsened.   He had an MI in June 2016, had a left heart cath by Dr. Shelva Majestic at that time.  Pt Diabetic: Yes, 6.3 A1C on 12/05/15 (review of records) Pt smoker: former smoker, quit in 2015  Pt meds include: Statin :Yes Betablocker: Yes ASA: Yes Other anticoagulants/antiplatelets: Plavix since his MI in June 2016    Current Outpatient Prescriptions  Medication Sig Dispense Refill  . allopurinol (ZYLOPRIM) 100 MG tablet Take 2 tablets by mouth  daily 60 tablet 2  . amLODipine (NORVASC) 10 MG tablet Take 1 tablet (10 mg total) by mouth daily. 90 tablet 1  . aspirin 81 MG chewable tablet Chew 1 tablet (81 mg total) by mouth daily.    Marland Kitchen atorvastatin (LIPITOR) 80 MG tablet TAKE 1 TABLET BY MOUTH  DAILY AT 6 PM. 90 tablet 1  . carvedilol (COREG) 25 MG tablet Take 1 tablet (25 mg total) by mouth 2 (two) times daily with a meal. 180 tablet 1  . clopidogrel (PLAVIX) 75 MG tablet Take 1 tablet (75 mg total) by mouth daily. DO NOT START UNTIL YOU FINISH BRILINTA 90 tablet 3  . isosorbide mononitrate (IMDUR) 60 MG 24 hr tablet Take 1 tablet by mouth  daily 90 tablet 1  . levothyroxine (SYNTHROID,  LEVOTHROID) 75 MCG tablet Take 1 tablet by mouth  daily 30 tablet 0  . losartan (COZAAR) 50 MG tablet Take 1 tablet (50 mg total) by mouth daily. 90 tablet 3  . nitroGLYCERIN (NITROSTAT) 0.4 MG SL tablet Place 1 tablet (0.4 mg total) under the tongue every 5 (five) minutes x 3 doses as needed for chest pain. 25 tablet 3  . sertraline (ZOLOFT) 25 MG tablet Take 1 tablet by mouth  daily 90 tablet 1   No current facility-administered medications for this visit.    Past Medical History  Diagnosis Date  . History of myocardial infarction   . Hypertension   . Hyperlipidemia   . CAD (coronary artery disease)     cath 05/16/2015 patent LIMA to LAD, SVG to OM, SVG to RPDA, SVG to diagonal was occluded. PTCA 95% prox LAD reduce down to 60%. Medical management  . Diabetic peripheral neuropathy ([place])   . Benign prostatic hypertrophy   . Diabetes mellitus type II   . History of tobacco abuse   . History of syncope     most likely vasovagal  . Thrombus 04/2008    acute thrombus of his right superficial artery with claudication  . Diabetes, polyneuropathy ([place])   . Colitis, ischemic ([place]) 06/2005     history of  . DVT (deep venous thrombosis) ([place])   . Myocardial  infarction ([place])   . Peripheral vascular disease ([place])   . Carotid artery occlusion     Social History Social History  Substance Use Topics  . Smoking status: Former Smoker -- 15 years    Types: Cigarettes    Quit date: 04/14/2011  . Smokeless tobacco: Never Used  . Alcohol Use: No    Family History Family History  Problem Relation Age of Onset  . Alcohol abuse    . Stroke    . Coronary artery disease    . Hyperlipidemia    . Other Neg Hx     denies family history of blood clots or blood disorder  . Cancer Mother   . Deep vein thrombosis Mother     Varicose Veins  . Heart disease Mother   . Hyperlipidemia Mother   . Hypertension Mother   . Peripheral vascular disease Mother   . Stroke Father   . Deep vein thrombosis  Father   . Diabetes Father   . Heart disease Father     Heart Disease  before age 40  . Hyperlipidemia Father   . Hypertension Father   . Heart attack Father   . Peripheral vascular disease Father   . Deep vein thrombosis Brother   . Diabetes Brother   . Heart disease Brother     Heart Disease before age 24  . Hyperlipidemia Brother   . Hypertension Brother   . Heart attack Brother   . Peripheral vascular disease Brother     Surgical History Past Surgical History  Procedure Laterality Date  . Other surgical history  11/2010    angiogram, right leg stent placement-  -Dr Kellie Simmering  . Peripheral vascular surgery    . Pr vein bypass graft,aorto-fem-pop  08/04/00  . Coronary artery bypass graft  08/04/00    times 4 (left interanl mammary artery to the left anterior descending, saphenous vein,graft to diagonal, saphenous vein graft to obtuse marginal, spahenous vein graft to posterior descending-Surgeon  Tharon Aquas Tright III  . Lower extremity angiogram  March 09, 2012  . Lower extremity angiogram Right 03/09/2012    Procedure: LOWER EXTREMITY ANGIOGRAM;  Surgeon: Serafina Mitchell, MD;  Location: [facility];  Service: Cardiovascular;  Laterality: Right;  . Cardiac catheterization N/A 05/15/2015    Procedure: Left Heart Cath and Cors/Grafts Angiography;  Surgeon: Troy Sine, MD;  Location: [facility];  Service: Cardiovascular;  Laterality: N/A;    No Known Allergies  Current Outpatient Prescriptions  Medication Sig Dispense Refill  . allopurinol (ZYLOPRIM) 100 MG tablet Take 2 tablets by mouth  daily 60 tablet 2  . amLODipine (NORVASC) 10 MG tablet Take 1 tablet (10 mg total) by mouth daily. 90 tablet 1  . aspirin 81 MG chewable tablet Chew 1 tablet (81 mg total) by mouth daily.    Marland Kitchen atorvastatin (LIPITOR) 80 MG tablet TAKE 1 TABLET BY MOUTH  DAILY AT 6 PM. 90 tablet 1  . carvedilol (COREG) 25 MG tablet Take 1 tablet (25 mg total) by mouth 2 (two) times daily with a meal. 180  tablet 1  . clopidogrel (PLAVIX) 75 MG tablet Take 1 tablet (75 mg total) by mouth daily. DO NOT START UNTIL YOU FINISH BRILINTA 90 tablet 3  . isosorbide mononitrate (IMDUR) 60 MG 24 hr tablet Take 1 tablet by mouth  daily 90 tablet 1  . levothyroxine (SYNTHROID, LEVOTHROID) 75 MCG tablet Take 1 tablet by mouth  daily 30 tablet 0  .  losartan (COZAAR) 50 MG tablet Take 1 tablet (50 mg total) by mouth daily. 90 tablet 3  . nitroGLYCERIN (NITROSTAT) 0.4 MG SL tablet Place 1 tablet (0.4 mg total) under the tongue every 5 (five) minutes x 3 doses as needed for chest pain. 25 tablet 3  . sertraline (ZOLOFT) 25 MG tablet Take 1 tablet by mouth  daily 90 tablet 1   No current facility-administered medications for this visit.     REVIEW OF SYSTEMS: See HPI for pertinent positives and negatives.  Physical Examination  Filed Vitals:   12/18/15 1225 12/18/15 1227  BP: 84/57 92/53  Pulse: 72 69  Temp: 97.4 F (36.3 C)   TempSrc: Oral   Resp: 14   Height: 5\' 10"  (1.778 m)   Weight: 216 lb (97.977 kg)   SpO2: 96%    Body mass index is 30.99 kg/(m^2).  General: WDWN in NAD Gait: Normal HENT: WNL Eyes: Pupils equal Pulmonary: normal non-labored breathing , without Rales, rhonchi, wheezing Cardiac: RRR, no detected Murmur  Abdomen: soft, NT, no masses Skin: no rashes, ulcers noted; no Gangrene , no cellulitis; no open wounds;   VASCULAR EXAM  Carotid Bruits Left Right   Negative Negative   Aorta is not palpable. Radial pulses are 2+ and equal.   VASCULAR EXAM: Extremities without ischemic changes  without Gangrene; without open wounds.     LE Pulses LEFT RIGHT   FEMORAL  3+palpable  3+palpable    POPLITEAL not palpable  not palpable   POSTERIOR TIBIAL  2+  palpable  not palpable    DORSALIS PEDIS  ANTERIOR TIBIAL 1+ palpable  1+palpable    Musculoskeletal: no muscle wasting or atrophy; no peripheral edema Neurologic: A&O X 3; Appropriate Affect ;  SENSATION: normal; MOTOR FUNCTION: 5/5 Symmetric, CN 2-12 intact Speech is fluent/normal         Non-Invasive Vascular Imaging (12/14/15):  CEREBROVASCULAR DUPLEX EVALUATION    INDICATION: Follow-up carotid disease     PREVIOUS INTERVENTION(S):     DUPLEX EXAM:     RIGHT  LEFT  Peak Systolic Velocities (cm/s) End Diastolic Velocities (cm/s) Plaque LOCATION Peak Systolic Velocities (cm/s) End Diastolic Velocities (cm/s) Plaque  56 12  CCA PROXIMAL 109 18   56 11  CCA MID 65 16   59 12 HT CCA DISTAL 59 15   174 28  ECA 157 20   137 31 HT ICA PROXIMAL 79 19 HT/CP  63 14  ICA MID 101 28   56 17  ICA DISTAL 59 16     2.3 ICA / CCA Ratio (PSV) 1.3  Antegrade  Vertebral Flow Antegrade    Brachial Systolic Pressure (mmHg)   Within normal limits  Subclavian Artery Waveforms Within normal limits     Plaque Morphology:  HM = Homogeneous, HT = Heterogeneous, CP = Calcific Plaque, SP = Smooth Plaque, IP = Irregular Plaque     ADDITIONAL FINDINGS:     IMPRESSION: 1. Evidence of <40% stenosis of the bilateral internal carotid artery. Plaque in the left internal carotid artery is calcific and may be underestimated. 2. Bilateral vertebral artery is antegrade.    Compared to the previous exam:  No significant change compared to prior exam.      LOWER EXTREMITY ARTERIAL EVALUATION    INDICATION: Follow-up right lower extremity arterial intervention    PREVIOUS INTERVENTION(S): Right popliteal artery thrombectomy 2009 Right superficial femoral artery stent 12/17/2010 with angioplasty 03/09/2012    DUPLEX EXAM:  RIGHT  LEFT   Peak Systolic Velocity (cm/s) Ratio (if abnormal) Waveform  Peak Systolic Velocity (cm/s) Ratio (if abnormal) Waveform  87  B Artery  - Proximal to Stent     97  B Stent - Origin     266 2.7 B Stent - Proximal     163  B Stent - Mid     117  B Stent - Distal     117  B  Stent - End     134  B Artery - Distal to Stent     1.08 Today's ABI / TBI 1.09  0.91 Previous ABI / TBI (12/12/2014 ) 1.0    Waveform:    M - Monophasic       B - Biphasic       T - Triphasic  If Ankle Brachial Index (ABI) or Toe Brachial Index (TBI) performed, please see complete report     ADDITIONAL FINDINGS:     IMPRESSION: 1. Patent right superficial femoral artery stent with 50%-99% stenosis just beyond the stent origin. 2. Diffuse disease observed throughout the right popliteal artery without hemodynamically significant stenosis.    Compared to the previous exam:  No significant change compared to prior exam.       ASSESSMENT:  Fernando Combs is a 70 y.o. male who is s/p atherectomy and angioplasty of the stent in the right SFA in April 2013, and right SFA to popliteal thrombectomy in 2009 with the placement of the right SFA stent in January 2012.  He also has a history of mild carotid artery stenosis. He has no claudication symptoms, no signs of ischemia in his feet/legs.  He has no stroke or TIA history.  Today's carotid duplex suggests <40% stenosis of the bilateral internal carotid artery. No significant change compared to prior exam.   Today's right LE arterial duplex suggests right superficial femoral artery stent with 50%-99% stenosis just beyond the stent origin. Diffuse disease observed throughout the right popliteal artery without hemodynamically significant stenosis. No significant change compared to prior exam.   ABI in both legs are normal with all triphasic waveforms, improved in the right leg.  Pt is hypotensive now, states his blood pressure at home is 123456 systolic, he denies feeling light-headed or dizzy. I advised pt to speak with his cardiologist about this.   PLAN:   Graduated walking program. Based on today's  exam and non-invasive vascular lab results, the patient will follow up in 1 year with the following tests: cartotid Duplex, ABI's, and right LE arterial Duplex. I discussed in depth with the patient the nature of atherosclerosis, and emphasized the importance of maximal medical management including strict control of blood pressure, blood glucose, and lipid levels, obtaining regular exercise, and continued cessation of smoking.  The patient is aware that without maximal medical management the underlying atherosclerotic disease process will progress, limiting the benefit of any interventions.  The patient was given information about stroke prevention and what symptoms should prompt the patient to seek immediate medical care.  The patient was given information about PAD including signs, symptoms, treatment, what symptoms should prompt the patient to seek immediate medical care, and risk reduction measures to take. Thank you for allowing [place] to participate in this patient's care.  Clemon Chambers, RN, MSN, FNP-C [facility] Office: [phone]  Clinic MD: Kellie Simmering 12/18/2015 12:25 PM

## 2015-12-18 NOTE — Patient Instructions (Signed)
Stroke Prevention Some medical conditions and behaviors are associated with an increased chance of having a stroke. You may prevent a stroke by making healthy choices and managing medical conditions. HOW CAN I REDUCE MY RISK OF HAVING A STROKE?   Stay physically active. Get at least 30 minutes of activity on most or all days.  Do not smoke. It may also be helpful to avoid exposure to secondhand smoke.  Limit alcohol use. Moderate alcohol use is considered to be:  No more than 2 drinks per day for men.  No more than 1 drink per day for nonpregnant women.  Eat healthy foods. This involves:  Eating 5 or more servings of fruits and vegetables a day.  Making dietary changes that address high blood pressure (hypertension), high cholesterol, diabetes, or obesity.  Manage your cholesterol levels.  Making food choices that are high in fiber and low in saturated fat, trans fat, and cholesterol may control cholesterol levels.  Take any prescribed medicines to control cholesterol as directed by your health care provider.  Manage your diabetes.  Controlling your carbohydrate and sugar intake is recommended to manage diabetes.  Take any prescribed medicines to control diabetes as directed by your health care provider.  Control your hypertension.  Making food choices that are low in salt (sodium), saturated fat, trans fat, and cholesterol is recommended to manage hypertension.  Ask your health care provider if you need treatment to lower your blood pressure. Take any prescribed medicines to control hypertension as directed by your health care provider.  If you are 18-39 years of age, have your blood pressure checked every 3-5 years. If you are 40 years of age or older, have your blood pressure checked every year.  Maintain a healthy weight.  Reducing calorie intake and making food choices that are low in sodium, saturated fat, trans fat, and cholesterol are recommended to manage  weight.  Stop drug abuse.  Avoid taking birth control pills.  Talk to your health care provider about the risks of taking birth control pills if you are over 35 years old, smoke, get migraines, or have ever had a blood clot.  Get evaluated for sleep disorders (sleep apnea).  Talk to your health care provider about getting a sleep evaluation if you snore a lot or have excessive sleepiness.  Take medicines only as directed by your health care provider.  For some people, aspirin or blood thinners (anticoagulants) are helpful in reducing the risk of forming abnormal blood clots that can lead to stroke. If you have the irregular heart rhythm of atrial fibrillation, you should be on a blood thinner unless there is a good reason you cannot take them.  Understand all your medicine instructions.  Make sure that other conditions (such as anemia or atherosclerosis) are addressed. SEEK IMMEDIATE MEDICAL CARE IF:   You have sudden weakness or numbness of the face, arm, or leg, especially on one side of the body.  Your face or eyelid droops to one side.  You have sudden confusion.  You have trouble speaking (aphasia) or understanding.  You have sudden trouble seeing in one or both eyes.  You have sudden trouble walking.  You have dizziness.  You have a loss of balance or coordination.  You have a sudden, severe headache with no known cause.  You have new chest pain or an irregular heartbeat. Any of these symptoms may represent a serious problem that is an emergency. Do not wait to see if the symptoms will   go away. Get medical help at once. Call your local emergency services (911 in [place] Do not drive yourself to the hospital.   This information is not intended to replace advice given to you by your health care provider. Make sure you discuss any questions you have with your health care provider.   Document Released: 12/18/2004 Document Revised: 12/01/2014 Document Reviewed:  05/13/2013 Elsevier Interactive Patient Education 2016 Elsevier Inc.    Peripheral Vascular Disease Peripheral vascular disease (PVD) is a disease of the blood vessels that are not part of your heart and brain. A simple term for PVD is poor circulation. In most cases, PVD narrows the blood vessels that carry blood from your heart to the rest of your body. This can result in a decreased supply of blood to your arms, legs, and internal organs, like your stomach or kidneys. However, it most often affects a person's lower legs and feet. There are two types of PVD.  Organic PVD. This is the more common type. It is caused by damage to the structure of blood vessels.  Functional PVD. This is caused by conditions that make blood vessels contract and tighten (spasm). Without treatment, PVD tends to get worse over time. PVD can also lead to acute ischemic limb. This is when an arm or limb suddenly has trouble getting enough blood. This is a medical emergency. CAUSES Each type of PVD has many different causes. The most common cause of PVD is buildup of a fatty material (plaque) inside of your arteries (atherosclerosis). Small amounts of plaque can break off from the walls of the blood vessels and become lodged in a smaller artery. This blocks blood flow and can cause acute ischemic limb. Other common causes of PVD include:  Blood clots that form inside of blood vessels.  Injuries to blood vessels.  Diseases that cause inflammation of blood vessels or cause blood vessel spasms.  Health behaviors and health history that increase your risk of developing PVD. RISK FACTORS  You may have a greater risk of PVD if you:  Have a family history of PVD.  Have certain medical conditions, including:  High cholesterol.  Diabetes.  High blood pressure (hypertension).  Coronary heart disease.  Past problems with blood clots.  Past injury, such as burns or a broken bone. These may have damaged blood  vessels in your limbs.  Buerger disease. This is caused by inflamed blood vessels in your hands and feet.  Some forms of arthritis.  Rare birth defects that affect the arteries in your legs.  Use tobacco.  Do not get enough exercise.  Are obese.  Are age 50 or older. SIGNS AND SYMPTOMS  PVD may cause many different symptoms. Your symptoms depend on what part of your body is not getting enough blood. Some common signs and symptoms include:  Cramps in your lower legs. This may be a symptom of poor leg circulation (claudication).  Pain and weakness in your legs while you are physically active that goes away when you rest (intermittent claudication).  Leg pain when at rest.  Leg numbness, tingling, or weakness.  Coldness in a leg or foot, especially when compared with the other leg.  Skin or hair changes. These can include:  Hair loss.  Shiny skin.  Pale or bluish skin.  Thick toenails.  Inability to get or maintain an erection (erectile dysfunction). People with PVD are more prone to developing ulcers and sores on their toes, feet, or legs. These may take longer than   normal to heal. DIAGNOSIS Your health care provider may diagnose PVD from your signs and symptoms. The health care provider will also do a physical exam. You may have tests to find out what is causing your PVD and determine its severity. Tests may include:  Blood pressure recordings from your arms and legs and measurements of the strength of your pulses (pulse volume recordings).  Imaging studies using sound waves to take pictures of the blood flow through your blood vessels (Doppler ultrasound).  Injecting a dye into your blood vessels before having imaging studies using:  X-rays (angiogram or arteriogram).  Computer-generated X-rays (CT angiogram).  A powerful electromagnetic field and a computer (magnetic resonance angiogram or MRA). TREATMENT Treatment for PVD depends on the cause of your condition  and the severity of your symptoms. It also depends on your age. Underlying causes need to be treated and controlled. These include long-lasting (chronic) conditions, such as diabetes, high cholesterol, and high blood pressure. You may need to first try making lifestyle changes and taking medicines. Surgery may be needed if these do not work. Lifestyle changes may include:  Quitting smoking.  Exercising regularly.  Following a low-fat, low-cholesterol diet. Medicines may include:  Blood thinners to prevent blood clots.  Medicines to improve blood flow.  Medicines to improve your blood cholesterol levels. Surgical procedures may include:  A procedure that uses an inflated balloon to open a blocked artery and improve blood flow (angioplasty).  A procedure to put in a tube (stent) to keep a blocked artery open (stent implant).  Surgery to reroute blood flow around a blocked artery (peripheral bypass surgery).  Surgery to remove dead tissue from an infected wound on the affected limb.  Amputation. This is surgical removal of the affected limb. This may be necessary in cases of acute ischemic limb that are not improved through medical or surgical treatments. HOME CARE INSTRUCTIONS  Take medicines only as directed by your health care provider.  Do not use any tobacco products, including cigarettes, chewing tobacco, or electronic cigarettes. If you need help quitting, ask your health care provider.  Lose weight if you are overweight, and maintain a healthy weight as directed by your health care provider.  Eat a diet that is low in fat and cholesterol. If you need help, ask your health care provider.  Exercise regularly. Ask your health care provider to suggest some good activities for you.  Use compression stockings or other mechanical devices as directed by your health care provider.  Take good care of your feet.  Wear comfortable shoes that fit well.  Check your feet often for  any cuts or sores. SEEK MEDICAL CARE IF:  You have cramps in your legs while walking.  You have leg pain when you are at rest.  You have coldness in a leg or foot.  Your skin changes.  You have erectile dysfunction.  You have cuts or sores on your feet that are not healing. SEEK IMMEDIATE MEDICAL CARE IF:  Your arm or leg turns cold and blue.  Your arms or legs become red, warm, swollen, painful, or numb.  You have chest pain or trouble breathing.  You suddenly have weakness in your face, arm, or leg.  You become very confused or lose the ability to speak.  You suddenly have a very bad headache or lose your vision.   This information is not intended to replace advice given to you by your health care provider. Make sure you discuss any questions   you have with your health care provider.   Document Released: 12/18/2004 Document Revised: 12/01/2014 Document Reviewed: 04/20/2014 Elsevier Interactive Patient Education 2016 Elsevier Inc.  

## 2016-01-06 ENCOUNTER — Other Ambulatory Visit: Payer: Self-pay | Admitting: Internal Medicine

## 2016-02-17 ENCOUNTER — Other Ambulatory Visit: Payer: Self-pay | Admitting: Internal Medicine

## 2016-02-29 ENCOUNTER — Other Ambulatory Visit: Payer: Self-pay | Admitting: Cardiology

## 2016-02-29 ENCOUNTER — Other Ambulatory Visit: Payer: Self-pay | Admitting: Internal Medicine

## 2016-02-29 NOTE — Telephone Encounter (Signed)
Rx(s) sent to pharmacy electronically.  

## 2016-04-20 ENCOUNTER — Other Ambulatory Visit: Payer: Self-pay | Admitting: Cardiology

## 2016-04-20 ENCOUNTER — Other Ambulatory Visit: Payer: Self-pay | Admitting: Family Medicine

## 2016-04-20 ENCOUNTER — Other Ambulatory Visit: Payer: Self-pay | Admitting: Internal Medicine

## 2016-04-22 NOTE — Telephone Encounter (Signed)
Losartan refilled for 1 year supply Sept 2016.

## 2016-04-23 NOTE — Telephone Encounter (Signed)
Dr.Kim okay to refill Allopurinol?

## 2016-04-24 ENCOUNTER — Telehealth: Payer: Self-pay | Admitting: Family Medicine

## 2016-04-24 NOTE — Telephone Encounter (Signed)
Ok to set up new pt visit in 30 minute slot in next 3 months and sed 3 months refill on zyoprim. Thanks.

## 2016-04-24 NOTE — Telephone Encounter (Signed)
Refill request for Zyloprim 100 mg take 2 po qd and send to [facility].

## 2016-04-24 NOTE — Telephone Encounter (Signed)
Pt was seen by you on 12/05/15--f/u review medications. He is requesting a refill on Zyoprim. Last filled 3/27 #180 Please advise  Would you be willing to accept him as a NP, former pt of Dr. Shawna Orleans. Thank you.

## 2016-04-25 MED ORDER — ALLOPURINOL 100 MG PO TABS: ORAL_TABLET | ORAL | Status: DC

## 2016-04-25 NOTE — Addendum Note (Signed)
Addended by: Elio Forget on: 04/25/2016 07:53 AM   Modules accepted: Orders

## 2016-04-25 NOTE — Telephone Encounter (Signed)
Medication sent in for patient. See Dr. Julianne Rice annotations below and set up pt. Thank you.

## 2016-04-25 NOTE — Telephone Encounter (Signed)
Left message for pt to call back  °

## 2016-04-29 NOTE — Telephone Encounter (Signed)
Spoke with pt, he was driving. Asked me to call back in 20 min

## 2016-04-29 NOTE — Telephone Encounter (Signed)
I have called pt 2 x since then and no answer. Was able to leave message.

## 2016-04-29 NOTE — Telephone Encounter (Signed)
Left message to call back  

## 2016-07-25 ENCOUNTER — Other Ambulatory Visit: Payer: Self-pay | Admitting: Internal Medicine

## 2016-07-25 ENCOUNTER — Other Ambulatory Visit: Payer: Self-pay | Admitting: Family Medicine

## 2016-08-07 ENCOUNTER — Ambulatory Visit (INDEPENDENT_AMBULATORY_CARE_PROVIDER_SITE_OTHER): Payer: Medicare Other | Admitting: Adult Health

## 2016-08-07 ENCOUNTER — Encounter: Payer: Self-pay | Admitting: Adult Health

## 2016-08-07 VITALS — BP 124/78 | Temp 98.6°F | Ht 70.0 in | Wt 223.9 lb

## 2016-08-07 DIAGNOSIS — Z7189 Other specified counseling: Secondary | ICD-10-CM

## 2016-08-07 DIAGNOSIS — N400 Enlarged prostate without lower urinary tract symptoms: Secondary | ICD-10-CM

## 2016-08-07 DIAGNOSIS — I10 Essential (primary) hypertension: Secondary | ICD-10-CM

## 2016-08-07 DIAGNOSIS — E118 Type 2 diabetes mellitus with unspecified complications: Secondary | ICD-10-CM

## 2016-08-07 DIAGNOSIS — Z1211 Encounter for screening for malignant neoplasm of colon: Secondary | ICD-10-CM

## 2016-08-07 DIAGNOSIS — Z23 Encounter for immunization: Secondary | ICD-10-CM

## 2016-08-07 DIAGNOSIS — E038 Other specified hypothyroidism: Secondary | ICD-10-CM | POA: Diagnosis not present

## 2016-08-07 DIAGNOSIS — M545 Low back pain, unspecified: Secondary | ICD-10-CM

## 2016-08-07 DIAGNOSIS — Z760 Encounter for issue of repeat prescription: Secondary | ICD-10-CM

## 2016-08-07 DIAGNOSIS — E1142 Type 2 diabetes mellitus with diabetic polyneuropathy: Secondary | ICD-10-CM

## 2016-08-07 DIAGNOSIS — Z7689 Persons encountering health services in other specified circumstances: Secondary | ICD-10-CM

## 2016-08-07 LAB — POCT GLYCOSYLATED HEMOGLOBIN (HGB A1C): HEMOGLOBIN A1C: 6

## 2016-08-07 MED ORDER — GABAPENTIN 100 MG PO CAPS: 100.0000 mg | ORAL_CAPSULE | Freq: Three times a day (TID) | ORAL | 2 refills | Status: DC

## 2016-08-07 MED ORDER — NITROGLYCERIN 0.4 MG SL SUBL: 0.4000 mg | SUBLINGUAL_TABLET | SUBLINGUAL | 3 refills | Status: DC | PRN

## 2016-08-07 MED ORDER — ALLOPURINOL 100 MG PO TABS: ORAL_TABLET | ORAL | 2 refills | Status: DC

## 2016-08-07 MED ORDER — ISOSORBIDE MONONITRATE ER 60 MG PO TB24: 60.0000 mg | ORAL_TABLET | Freq: Every day | ORAL | 3 refills | Status: DC

## 2016-08-07 MED ORDER — CARVEDILOL 25 MG PO TABS: ORAL_TABLET | ORAL | 3 refills | Status: DC

## 2016-08-07 MED ORDER — ATORVASTATIN CALCIUM 80 MG PO TABS: ORAL_TABLET | ORAL | 3 refills | Status: DC

## 2016-08-07 MED ORDER — AMLODIPINE BESYLATE 10 MG PO TABS: 10.0000 mg | ORAL_TABLET | Freq: Every day | ORAL | 3 refills | Status: DC

## 2016-08-07 MED ORDER — TAMSULOSIN HCL 0.4 MG PO CAPS: 0.4000 mg | ORAL_CAPSULE | Freq: Every day | ORAL | 3 refills | Status: DC

## 2016-08-07 MED ORDER — LEVOTHYROXINE SODIUM 75 MCG PO TABS: 75.0000 ug | ORAL_TABLET | Freq: Every day | ORAL | 3 refills | Status: DC

## 2016-08-07 NOTE — Progress Notes (Signed)
Patient presents to clinic today to establish care. He is a pleasant 70 year old male who  has a past medical history of Benign prostatic hypertrophy; CAD (coronary artery disease); Carotid artery occlusion; Colitis, ischemic ([place]) (06/2005); Diabetes mellitus type II; Diabetes, polyneuropathy ([place]); Diabetic peripheral neuropathy ([place]); DVT (deep venous thrombosis) ([place]); History of myocardial infarction; History of syncope; History of tobacco abuse; Hyperlipidemia; Hypertension; Myocardial infarction ([place]); Peripheral vascular disease ([place]); and Thrombus (04/2008).   Acute Concerns: Establish Care   Lower back pain  - He is unsure of when he started experiencing low back pain but believes it was about a month ago. He reports that he notices the pain more when he bends over over rolls around in bed. The pain feels as though "it is a muscle pain." The pain radiates across his lower back. Denies any urinary symptoms. No history of kidney stones.   Nocturia - He reports that he is having to get up 4-5 times per night to urinate. He does feel like he is not emptying his bladder completley. Denies having any issues during the day. He does have a history of BPH and has been on Flomax in the past and felt like it was working well when he took it. He would like to try Flomax again   Chronic Issues: Diabetic Neuropathy - He reports significant burning in his feet from diabetic neuropathy. He reports burning pain in his toes. Sometimes he cannot sleep and it makes it hard for him to wear closed toe shoes.   DM2 - Appears to be controlled with diet. He has not been taking any medication. He does endorse significant   Depression  - He reports that he is weaning himself off Zoloft, is currently taking every other day.    Health Maintenance: Dental -- Does not do routine care  Vision -- Routine care Immunizations -- Needs flu shot today  Colonoscopy --2006 -  Diet: He eats a heart healthy diet. He  does eat ice cream every night Exercise: He does not exercise. Tries to walk 2-3 miles per day   Vascular Surgery - yearly Cardiology - Dr. Stanford Breed - Every six months   Past Medical History:  Diagnosis Date  . Benign prostatic hypertrophy   . CAD (coronary artery disease)    cath 05/16/2015 patent LIMA to LAD, SVG to OM, SVG to RPDA, SVG to diagonal was occluded. PTCA 95% prox LAD reduce down to 60%. Medical management  . Carotid artery occlusion   . Colitis, ischemic ([place]) 06/2005    history of  . Diabetes mellitus type II   . Diabetes, polyneuropathy ([place])   . Diabetic peripheral neuropathy ([place])   . DVT (deep venous thrombosis) ([place])   . History of myocardial infarction   . History of syncope    most likely vasovagal  . History of tobacco abuse   . Hyperlipidemia   . Hypertension   . Myocardial infarction ([place])   . Peripheral vascular disease ([place])   . Thrombus 04/2008   acute thrombus of his right superficial artery with claudication    Past Surgical History:  Procedure Laterality Date  . CARDIAC CATHETERIZATION N/A 05/15/2015   Procedure: Left Heart Cath and Cors/Grafts Angiography;  Surgeon: Troy Sine, MD;  Location: [facility];  Service: Cardiovascular;  Laterality: N/A;  . CORONARY ARTERY BYPASS GRAFT  08/04/00   times 4 (left interanl mammary artery to the left anterior descending, saphenous vein,graft to diagonal, saphenous vein  graft to obtuse marginal, spahenous vein graft to posterior descending-Surgeon  Tharon Aquas Tright III  . LOWER EXTREMITY ANGIOGRAM  March 09, 2012  . LOWER EXTREMITY ANGIOGRAM Right 03/09/2012   Procedure: LOWER EXTREMITY ANGIOGRAM;  Surgeon: Serafina Mitchell, MD;  Location: [facility] CATH LAB;  Service: Cardiovascular;  Laterality: Right;  . OTHER SURGICAL HISTORY  11/2010   angiogram, right leg stent placement-  -Dr Kellie Simmering  . peripheral vascular surgery    . PR VEIN BYPASS GRAFT,AORTO-FEM-POP  08/04/00    Current Outpatient Prescriptions  on File Prior to Visit  Medication Sig Dispense Refill  . allopurinol (ZYLOPRIM) 100 MG tablet Take 2 tablets by mouth  daily 180 tablet 2  . amLODipine (NORVASC) 10 MG tablet Take 1 tablet by mouth  daily 90 tablet 0  . aspirin 81 MG chewable tablet Chew 1 tablet (81 mg total) by mouth daily.    Marland Kitchen atorvastatin (LIPITOR) 80 MG tablet Take 1 tablet by mouth   daily at 6 p.m. 90 tablet 3  . carvedilol (COREG) 25 MG tablet Take 1 tablet by mouth two  times daily with a meal 180 tablet 3  . clopidogrel (PLAVIX) 75 MG tablet Take 1 tablet (75 mg total) by mouth daily. 90 tablet 1  . isosorbide mononitrate (IMDUR) 60 MG 24 hr tablet Take 1 tablet by mouth  daily 90 tablet 3  . levothyroxine (SYNTHROID, LEVOTHROID) 75 MCG tablet Take 1 tablet by mouth  daily 90 tablet 3  . losartan (COZAAR) 50 MG tablet Take 1 tablet (50 mg total) by mouth daily. 90 tablet 3  . nitroGLYCERIN (NITROSTAT) 0.4 MG SL tablet Place 1 tablet (0.4 mg total) under the tongue every 5 (five) minutes x 3 doses as needed for chest pain. 25 tablet 3  . sertraline (ZOLOFT) 25 MG tablet Take 1 tablet by mouth  daily 90 tablet 3   No current facility-administered medications on file prior to visit.     No Known Allergies  Family History  Problem Relation Age of Onset  . Alcohol abuse    . Stroke    . Coronary artery disease    . Hyperlipidemia    . Other Neg Hx     denies family history of blood clots or blood disorder  . Cancer Mother   . Deep vein thrombosis Mother     Varicose Veins  . Heart disease Mother   . Hyperlipidemia Mother   . Hypertension Mother   . Peripheral vascular disease Mother   . Stroke Father   . Deep vein thrombosis Father   . Diabetes Father   . Heart disease Father     Heart Disease  before age 34  . Hyperlipidemia Father   . Hypertension Father   . Heart attack Father   . Peripheral vascular disease Father   . Deep vein thrombosis Brother   . Diabetes Brother   . Heart disease Brother       Heart Disease before age 43  . Hyperlipidemia Brother   . Hypertension Brother   . Heart attack Brother   . Peripheral vascular disease Brother     Social History   Social History  . Marital status: Married    Spouse name: N/A  . Number of children: N/A  . Years of education: N/A   Occupational History  . Not on file.   Social History Main Topics  . Smoking status: Former Smoker    Years: 15.00    Types: Cigarettes  Quit date: 04/14/2011  . Smokeless tobacco: Never Used  . Alcohol use No  . Drug use: No  . Sexual activity: Not on file   Other Topics Concern  . Not on file   Social History Narrative   Occupation:  Works at [employer] - lives with wife (2nd marriage)   3 children     Alcohol use-no   Tobacco Use - Yes.           Review of Systems  Constitutional: Negative.   HENT: Negative.   Eyes: Negative.   Respiratory: Negative.   Cardiovascular: Negative.   Gastrointestinal: Negative.   Genitourinary: Negative.   Musculoskeletal: Positive for back pain. Negative for falls and myalgias.  Skin: Negative.   Neurological: Positive for tingling and sensory change (lower extremities).  Endo/Heme/Allergies: Negative.   Psychiatric/Behavioral: Negative.   All other systems reviewed and are negative.   There were no vitals taken for this visit.  Physical Exam  Constitutional: He is oriented to person, place, and time and well-developed, well-nourished, and in no distress. No distress.  HENT:  Head: Normocephalic and atraumatic.  Right Ear: External ear normal.  Left Ear: External ear normal.  Nose: Nose normal.  Mouth/Throat: Oropharynx is clear and moist. No oropharyngeal exudate.  Eyes: Conjunctivae and EOM are normal. Pupils are equal, round, and reactive to light. Right eye exhibits no discharge. Left eye exhibits no discharge. No scleral icterus.  Neck: Normal range of motion. Neck supple. No JVD present. No tracheal deviation present. No  thyromegaly present.  Cardiovascular: Normal rate, regular rhythm, normal heart sounds and intact distal pulses.  Exam reveals no gallop.   No murmur heard. Pulmonary/Chest: Effort normal and breath sounds normal. No stridor. No respiratory distress. He has no wheezes. He has no rales. He exhibits no tenderness.  Abdominal: Soft. Bowel sounds are normal. He exhibits no distension and no mass. There is no tenderness. There is no rebound and no guarding.  Musculoskeletal: Normal range of motion. He exhibits no edema, tenderness or deformity.  Lymphadenopathy:    He has no cervical adenopathy.  Neurological: He is alert and oriented to person, place, and time. He has normal reflexes. No cranial nerve deficit. He exhibits normal muscle tone. Gait normal. Coordination normal. GCS score is 15.  Skin: Skin is warm and dry. No rash noted. He is not diaphoretic. No erythema. No pallor.  Psychiatric: Mood, memory, affect and judgment normal.  Vitals reviewed.   Assessment/Plan: 1. Encounter to establish care - Follow up for CPE - Advised to continue to eat a diabetic diet and work on losing weight he has put on since retirement.   2. Essential hypertension - Well controlled on current medication  - carvedilol (COREG) 25 MG tablet; Take 1 tablet by mouth two  times daily with a meal  Dispense: 180 tablet; Refill: 3 - amLODipine (NORVASC) 10 MG tablet; Take 1 tablet (10 mg total) by mouth daily.  Dispense: 90 tablet; Refill: 3 - isosorbide mononitrate (IMDUR) 60 MG 24 hr tablet; Take 1 tablet (60 mg total) by mouth daily.  Dispense: 90 tablet; Refill: 3  3. Type 2 diabetes mellitus with complication, without long-term current use of insulin (HCC)  - POC HgB A1c 6.0   4. Other specified hypothyroidism  - levothyroxine (SYNTHROID, LEVOTHROID) 75 MCG tablet; Take 1 tablet (75 mcg total) by mouth daily.  Dispense: 90 tablet; Refill: 3  5. Medication refill  - carvedilol (COREG) 25 MG tablet; Take  1 tablet by mouth two  times daily with a meal  Dispense: 180 tablet; Refill: 3 - amLODipine (NORVASC) 10 MG tablet; Take 1 tablet (10 mg total) by mouth daily.  Dispense: 90 tablet; Refill: 3 - allopurinol (ZYLOPRIM) 100 MG tablet; Take 2 tablets by mouth  daily  Dispense: 180 tablet; Refill: 2 - atorvastatin (LIPITOR) 80 MG tablet; Take 1 tablet by mouth   daily at 6 p.m.  Dispense: 90 tablet; Refill: 3 - isosorbide mononitrate (IMDUR) 60 MG 24 hr tablet; Take 1 tablet (60 mg total) by mouth daily.  Dispense: 90 tablet; Refill: 3 - nitroGLYCERIN (NITROSTAT) 0.4 MG SL tablet; Place 1 tablet (0.4 mg total) under the tongue every 5 (five) minutes x 3 doses as needed for chest pain.  Dispense: 25 tablet; Refill: 3 - levothyroxine (SYNTHROID, LEVOTHROID) 75 MCG tablet; Take 1 tablet (75 mcg total) by mouth daily.  Dispense: 90 tablet; Refill: 3  6. Colon cancer screening - Ambulatory referral to Gastroenterology  7. Bilateral low back pain without sciatica  - Likely MSK in nature - Advised Salonpas patches or warm compresses - Follow up if no improvement   8. Diabetic polyneuropathy associated with type 2 diabetes mellitus (HCC)  - gabapentin (NEURONTIN) 100 MG capsule; Take 1 capsule (100 mg total) by mouth 3 (three) times daily.  Dispense: 90 capsule; Refill: 2   times daily.  Dispense: 90 capsule; Refill: 2  9. BPH (benign prostatic hyperplasia) - tamsulosin (FLOMAX) 0.4 MG CAPS capsule; Take 1 capsule (0.4 mg total) by mouth daily.  Dispense: 90 capsule; Refill: 3  10. Need for prophylactic vaccination and inoculation against influenza  - Flu vaccine HIGH DOSE PF (Fluzone High dose)

## 2016-08-07 NOTE — Patient Instructions (Addendum)
It was great meeting you today   Your A1c is 6.0   Please work on losing the extra weight you have put on   I have sent in a medication called Neurotin for the neuropathic pain. Take this three times a day.   Follow up with me for your physical. If you need anything, please let me know  Someone will call you to schedule your colonoscopy

## 2016-09-01 ENCOUNTER — Other Ambulatory Visit: Payer: Self-pay | Admitting: Cardiology

## 2016-09-01 NOTE — Telephone Encounter (Signed)
Rx request sent to pharmacy.  

## 2016-09-08 ENCOUNTER — Encounter: Payer: Self-pay | Admitting: Adult Health

## 2016-09-25 ENCOUNTER — Other Ambulatory Visit (INDEPENDENT_AMBULATORY_CARE_PROVIDER_SITE_OTHER): Payer: Medicare Other

## 2016-09-25 ENCOUNTER — Other Ambulatory Visit: Payer: Self-pay | Admitting: Adult Health

## 2016-09-25 DIAGNOSIS — Z760 Encounter for issue of repeat prescription: Secondary | ICD-10-CM

## 2016-09-25 DIAGNOSIS — Z0000 Encounter for general adult medical examination without abnormal findings: Secondary | ICD-10-CM | POA: Diagnosis not present

## 2016-09-25 DIAGNOSIS — Z125 Encounter for screening for malignant neoplasm of prostate: Secondary | ICD-10-CM | POA: Diagnosis not present

## 2016-09-25 DIAGNOSIS — E1142 Type 2 diabetes mellitus with diabetic polyneuropathy: Secondary | ICD-10-CM

## 2016-09-25 LAB — BASIC METABOLIC PANEL
BUN: 23 mg/dL (ref 6–23)
CALCIUM: 9.7 mg/dL (ref 8.4–10.5)
CHLORIDE: 103 meq/L (ref 96–112)
CO2: 26 mEq/L (ref 19–32)
CREATININE: 1.48 mg/dL (ref 0.40–1.50)
GFR: 49.77 mL/min — AB (ref >60.00)
Glucose, Bld: 138 mg/dL — ABNORMAL HIGH (ref 70–99)
Potassium: 4.2 mEq/L (ref 3.5–5.1)
Sodium: 139 mEq/L (ref 135–145)

## 2016-09-25 LAB — POC URINALSYSI DIPSTICK (AUTOMATED)
Bilirubin, UA: NEGATIVE
GLUCOSE UA: NEGATIVE
KETONES UA: NEGATIVE
Leukocytes, UA: NEGATIVE
Nitrite, UA: NEGATIVE
SPEC GRAV UA: 1.02
Urobilinogen, UA: 0.2
pH, UA: 5.5

## 2016-09-25 LAB — CBC WITH DIFFERENTIAL/PLATELET
BASOS ABS: 0 10*3/uL (ref 0.0–0.1)
BASOS PCT: 0.5 % (ref 0.0–3.0)
EOS ABS: 0.3 10*3/uL (ref 0.0–0.7)
Eosinophils Relative: 4.2 % (ref 0.0–5.0)
HCT: 42 % (ref 39.0–52.0)
HEMOGLOBIN: 14 g/dL (ref 13.0–17.0)
LYMPHS PCT: 16.1 % (ref 12.0–46.0)
Lymphs Abs: 1.3 10*3/uL (ref 0.7–4.0)
MCHC: 33.4 g/dL (ref 30.0–36.0)
MCV: 87 fl (ref 78.0–100.0)
MONO ABS: 0.7 10*3/uL (ref 0.1–1.0)
Monocytes Relative: 8.3 % (ref 3.0–12.0)
Neutro Abs: 5.9 10*3/uL (ref 1.4–7.7)
Neutrophils Relative %: 70.9 % (ref 43.0–77.0)
PLATELETS: 255 10*3/uL (ref 150.0–400.0)
RBC: 4.83 Mil/uL (ref 4.22–5.81)
RDW: 16.2 % — AB (ref 11.5–15.5)
WBC: 8.4 10*3/uL (ref 4.0–10.5)

## 2016-09-25 LAB — LIPID PANEL
CHOL/HDL RATIO: 3
Cholesterol: 110 mg/dL (ref 0–200)
HDL: 39.9 mg/dL (ref >39.00)
LDL CALC: 37 mg/dL (ref 0–99)
NONHDL: 70.38
TRIGLYCERIDES: 166 mg/dL — AB (ref 0.0–149.0)
VLDL: 33.2 mg/dL (ref 0.0–40.0)

## 2016-09-25 LAB — MICROALBUMIN / CREATININE URINE RATIO
Creatinine,U: 118.2 mg/dL
MICROALB/CREAT RATIO: 15.1 mg/g (ref 0.0–30.0)
Microalb, Ur: 17.9 mg/dL — ABNORMAL HIGH (ref 0.0–1.9)

## 2016-09-25 LAB — HEPATIC FUNCTION PANEL
ALK PHOS: 102 U/L (ref 39–117)
ALT: 15 U/L (ref 0–53)
AST: 14 U/L (ref 0–37)
Albumin: 4.3 g/dL (ref 3.5–5.2)
BILIRUBIN DIRECT: 0.1 mg/dL (ref 0.0–0.3)
BILIRUBIN TOTAL: 0.4 mg/dL (ref 0.2–1.2)
Total Protein: 7.3 g/dL (ref 6.0–8.3)

## 2016-09-25 LAB — HEMOGLOBIN A1C: Hgb A1c MFr Bld: 7 % — ABNORMAL HIGH (ref 4.6–6.5)

## 2016-09-25 LAB — PSA: PSA: 0.22 ng/mL (ref 0.10–4.00)

## 2016-09-25 LAB — TSH: TSH: 5.75 u[IU]/mL — ABNORMAL HIGH (ref 0.35–4.50)

## 2016-09-25 NOTE — Telephone Encounter (Signed)
Ok to refill for one year  

## 2016-09-30 ENCOUNTER — Ambulatory Visit (INDEPENDENT_AMBULATORY_CARE_PROVIDER_SITE_OTHER): Payer: Medicare Other | Admitting: Adult Health

## 2016-09-30 ENCOUNTER — Encounter: Payer: Self-pay | Admitting: Adult Health

## 2016-09-30 VITALS — BP 122/70 | HR 81 | Temp 98.1°F | Resp 20 | Ht 68.0 in | Wt 230.0 lb

## 2016-09-30 DIAGNOSIS — Z760 Encounter for issue of repeat prescription: Secondary | ICD-10-CM

## 2016-09-30 DIAGNOSIS — E038 Other specified hypothyroidism: Secondary | ICD-10-CM | POA: Diagnosis not present

## 2016-09-30 DIAGNOSIS — E118 Type 2 diabetes mellitus with unspecified complications: Secondary | ICD-10-CM | POA: Diagnosis not present

## 2016-09-30 DIAGNOSIS — N400 Enlarged prostate without lower urinary tract symptoms: Secondary | ICD-10-CM

## 2016-09-30 DIAGNOSIS — I10 Essential (primary) hypertension: Secondary | ICD-10-CM

## 2016-09-30 DIAGNOSIS — Z1211 Encounter for screening for malignant neoplasm of colon: Secondary | ICD-10-CM

## 2016-09-30 DIAGNOSIS — Z0000 Encounter for general adult medical examination without abnormal findings: Secondary | ICD-10-CM

## 2016-09-30 DIAGNOSIS — E1149 Type 2 diabetes mellitus with other diabetic neurological complication: Secondary | ICD-10-CM

## 2016-09-30 MED ORDER — LEVOTHYROXINE SODIUM 88 MCG PO TABS: 88.0000 ug | ORAL_TABLET | Freq: Every day | ORAL | 1 refills | Status: DC

## 2016-09-30 NOTE — Progress Notes (Signed)
Pre visit review using our clinic review tool, if applicable. No additional management support is needed unless otherwise documented below in the visit note. 

## 2016-09-30 NOTE — Patient Instructions (Signed)
It was great seeing you today!  Your A1c has increased from 6.0 to 7.0. - this is diet related. Work on cutting sweets and carbs from your diet.   Your thyroid level has increased as well. I sent in a new prescription for synthroid  Follow up in three months

## 2016-09-30 NOTE — Progress Notes (Signed)
Subjective:    Patient ID: Fernando Combs, male    DOB: 27-Apr-1946, 70 y.o.   MRN: [id number]  HPI  Patient presents for yearly preventative medicine examination. He is a pleasant 70 year old male who  has a past medical history of Benign prostatic hypertrophy; CAD (coronary artery disease); Carotid artery occlusion; Colitis, ischemic ([place]) (06/2005); Diabetes mellitus type II; Diabetes, polyneuropathy ([place]); Diabetic peripheral neuropathy ([place]); DVT (deep venous thrombosis) ([place]); Gout; History of myocardial infarction; History of syncope; History of tobacco abuse; Hyperlipidemia; Hypertension; Hypothyroidism; Myocardial infarction; Peripheral vascular disease ([place]); and Thrombus (04/2008).  All immunizations and health maintenance protocols were reviewed with the patient and needed orders were placed.  Medication reconciliation,  past medical history, social history, problem list and allergies were reviewed in detail with the patient  Goals were established with regard to weight loss, exercise, and  diet in compliance with medications  End of life planning was discussed.  Diabetes is diet controlled  He takes Synthroid 75 g for hypothyroidism  Since starting Flomax his nocturia has decreased from 4 times a night to one time a night  Since starting gabapentin for diabetic neuropathy he feels as though his neuropathy is much improved  He is up-to-date on his cardiology visits and vision screens. He did not make an appointment for colonoscopy. Per referral no voicemail was left but patient never returned phone call   Review of Systems  Constitutional: Negative.   HENT: Negative.   Eyes: Negative.   Respiratory: Negative.   Cardiovascular: Negative.   Gastrointestinal: Negative.   Endocrine: Negative.   Genitourinary: Negative.   Musculoskeletal: Positive for arthralgias (Chronic).  Skin: Negative.   Allergic/Immunologic: Negative.   Neurological: Positive for numbness  (Bilateral lower extremities).  Hematological: Negative.   Psychiatric/Behavioral: Negative.   All other systems reviewed and are negative.  Past Medical History:  Diagnosis Date  . Benign prostatic hypertrophy   . CAD (coronary artery disease)    cath 05/16/2015 patent LIMA to LAD, SVG to OM, SVG to RPDA, SVG to diagonal was occluded. PTCA 95% prox LAD reduce down to 60%. Medical management  . Carotid artery occlusion   . Colitis, ischemic ([place]) 06/2005    history of  . Diabetes mellitus type II   . Diabetes, polyneuropathy ([place])   . Diabetic peripheral neuropathy ([place])   . DVT (deep venous thrombosis) ([place])   . Gout   . History of myocardial infarction   . History of syncope    most likely vasovagal  . History of tobacco abuse   . Hyperlipidemia   . Hypertension   . Hypothyroidism   . Myocardial infarction   . Peripheral vascular disease ([place])   . Shingles   . Thrombus 04/2008   acute thrombus of his right superficial artery with claudication    Social History   Social History  . Marital status: Married    Spouse name: N/A  . Number of children: N/A  . Years of education: N/A   Occupational History  . Not on file.   Social History Main Topics  . Smoking status: Former Smoker    Years: 15.00    Types: Cigarettes    Quit date: 04/14/2011  . Smokeless tobacco: Never Used  . Alcohol use No  . Drug use: No  . Sexual activity: Not on file   Other Topics Concern  . Not on file   Social History Narrative   Occupation:  Retired from [employer]  Married - lives with wife (2nd marriage)   3 children     Alcohol use-no   Tobacco Use - Quit smoking 3 years ago           Past Surgical History:  Procedure Laterality Date  . CARDIAC CATHETERIZATION N/A 05/15/2015   Procedure: Left Heart Cath and Cors/Grafts Angiography;  Surgeon: Troy Sine, MD;  Location: [facility];  Service: Cardiovascular;  Laterality: N/A;  . CATARACT EXTRACTION  Bilateral   . CORONARY ARTERY BYPASS GRAFT  08/04/00   times 4 (left interanl mammary artery to the left anterior descending, saphenous vein,graft to diagonal, saphenous vein graft to obtuse marginal, spahenous vein graft to posterior descending-Surgeon  Tharon Aquas Tright III  . LOWER EXTREMITY ANGIOGRAM  March 09, 2012  . LOWER EXTREMITY ANGIOGRAM Right 03/09/2012   Procedure: LOWER EXTREMITY ANGIOGRAM;  Surgeon: Serafina Mitchell, MD;  Location[facility];  Service: Cardiovascular;  Laterality: Right;  . OTHER SURGICAL HISTORY  11/2010   angiogram, right leg stent placement-  -Dr Kellie Simmering  . peripheral vascular surgery    . PR VEIN BYPASS GRAFT,AORTO-FEM-POP  08/04/00    Family History  Problem Relation Age of Onset  . Cancer Mother   . Deep vein thrombosis Mother     Varicose Veins  . Heart disease Mother   . Hyperlipidemia Mother   . Hypertension Mother   . Peripheral vascular disease Mother   . Stroke Father   . Deep vein thrombosis Father   . Diabetes Father   . Heart disease Father     Heart Disease  before age 53  . Hyperlipidemia Father   . Hypertension Father   . Heart attack Father   . Peripheral vascular disease Father   . Alcohol abuse    . Stroke    . Coronary artery disease    . Hyperlipidemia    . Deep vein thrombosis Brother   . Diabetes Brother   . Heart disease Brother     Heart Disease before age 36  . Hyperlipidemia Brother   . Hypertension Brother   . Heart attack Brother   . Peripheral vascular disease Brother   . Lung cancer Sister   . Kidney cancer Brother     on hospice   . Other Neg Hx     denies family history of blood clots or blood disorder    No Known Allergies  Current Outpatient Prescriptions on File Prior to Visit  Medication Sig Dispense Refill  . allopurinol (ZYLOPRIM) 100 MG tablet Take 2 tablets by mouth  daily 180 tablet 2  . amLODipine (NORVASC) 10 MG tablet Take 1 tablet (10 mg total) by mouth daily. 90 tablet 3  . aspirin 81 MG  chewable tablet Chew 1 tablet (81 mg total) by mouth daily.    Marland Kitchen atorvastatin (LIPITOR) 80 MG tablet Take 1 tablet by mouth   daily at 6 p.m. 90 tablet 3  . carvedilol (COREG) 25 MG tablet Take 1 tablet by mouth two  times daily with a meal 180 tablet 3  . clopidogrel (PLAVIX) 75 MG tablet Take 1 tablet (75 mg total) by mouth daily. Please schedule appointment for refills. 90 tablet 0  . gabapentin (NEURONTIN) 100 MG capsule TAKE 1 CAPSULE BY MOUTH 3  TIMES DAILY 270 capsule 3  . isosorbide mononitrate (IMDUR) 60 MG 24 hr tablet Take 1 tablet (60 mg total) by mouth daily. 90 tablet 3  . levothyroxine (SYNTHROID, LEVOTHROID) 75  MCG tablet Take 1 tablet (75 mcg total) by mouth daily. 90 tablet 3  . nitroGLYCERIN (NITROSTAT) 0.4 MG SL tablet DISSOLVE 1 TABLET UNDER THE TONGUE EVERY 5 MINUTES AS  NEEDED FOR CHEST PAIN FOR  UP TO 3 DOSES. SEEK MEDICAL ATTENTION IF NO RELIEF. 90 tablet 3  . sertraline (ZOLOFT) 25 MG tablet Take 1 tablet by mouth  daily 90 tablet 3  . tamsulosin (FLOMAX) 0.4 MG CAPS capsule Take 1 capsule (0.4 mg total) by mouth daily. 90 capsule 3   No current facility-administered medications on file prior to visit.     BP 122/70 (BP Location: Left Arm, Patient Position: Sitting, Cuff Size: Normal)   Pulse 81   Temp 98.1 F (36.7 C) (Oral)   Resp 20   Ht 5\' 8"  (1.727 m)   Wt 230 lb (104.3 kg)   SpO2 96%   BMI 34.97 kg/m      Objective:   Physical Exam  Constitutional: He is oriented to person, place, and time. He appears well-developed and well-nourished. No distress.  obese  HENT:  Head: Normocephalic and atraumatic.  Right Ear: External ear normal.  Left Ear: External ear normal.  Nose: Nose normal.  Mouth/Throat: Oropharynx is clear and moist. No oropharyngeal exudate.  Eyes: Conjunctivae and EOM are normal. Pupils are equal, round, and reactive to light. Right eye exhibits no discharge. Left eye exhibits no discharge. No scleral icterus.  Neck: Normal range of  motion. Neck supple. No JVD present. Carotid bruit is not present. No tracheal deviation present. No thyroid mass and no thyromegaly present.  Cardiovascular: Normal rate, regular rhythm, normal heart sounds and intact distal pulses.  Exam reveals no gallop and no friction rub.   No murmur heard. Pulmonary/Chest: Effort normal and breath sounds normal. No stridor. No respiratory distress. He has no wheezes. He has no rales. He exhibits no tenderness.  Abdominal: Soft. Normal appearance, normal aorta and bowel sounds are normal. He exhibits no distension and no mass. There is no tenderness. There is no rebound and no guarding.  Genitourinary: Rectum normal. Rectal exam shows guaiac negative stool. Prostate is enlarged. Prostate is not tender.  Musculoskeletal: Normal range of motion. He exhibits no edema, tenderness or deformity.  Lymphadenopathy:    He has no cervical adenopathy.  Neurological: He is alert and oriented to person, place, and time. He has normal reflexes. He displays normal reflexes. No cranial nerve deficit. He exhibits normal muscle tone. Coordination normal.  Skin: Skin is warm and dry. No rash noted. He is not diaphoretic. No erythema. No pallor.  Psychiatric: He has a normal mood and affect. His behavior is normal. Judgment and thought content normal.  Nursing note and vitals reviewed.     Assessment & Plan:  1. Routine general medical examination at a health care facility - Viewed labs in detail with patient. All questions answered - His diet is getting worse. Advised to cut back on carbs and sweets in order to bring A1c down - Work on increasing exercise to at least 30 minutes a day for 3 days a week - Follow in one year for next physical exam - Follow-up with cardiology as directed  2. Other specified hypothyroidism - TSH continues to increase slightly. Will increase Synthroid dose from 75 g to 88 g. Will retest when we redo his A1c in 3 months - levothyroxine  (SYNTHROID, LEVOTHROID) 88 MCG tablet; Take 1 tablet (88 mcg total) by mouth daily.  Dispense: 90 tablet; Refill:  1  3. Medication refill - levothyroxine (SYNTHROID, LEVOTHROID) 88 MCG tablet; Take 1 tablet (88 mcg total) by mouth daily.  Dispense: 90 tablet; Refill: 1  4. Type 2 diabetes mellitus with complication, without long-term current use of insulin (HCC) - A1c increased from 6.0-7.0. This is due to diet. Advised to change diet and increase exercise - Follow-up in 3 months 5. Other diabetic neurological complication associated with type 2 diabetes mellitus ([place]) - Proving with the use of gabapentin. No change in dose at this time  6. Benign prostatic hyperplasia without lower urinary tract symptoms - Seems to be improved on Flomax  7. Essential hypertension - Well-controlled with current therapy. No change in medication  8. Colon cancer screening  - Ambulatory referral to Gastroenterology  Dorothyann Peng, NP

## 2016-10-06 ENCOUNTER — Telehealth: Payer: Self-pay | Admitting: *Deleted

## 2016-10-06 ENCOUNTER — Encounter: Payer: Self-pay | Admitting: Physician Assistant

## 2016-10-06 ENCOUNTER — Encounter (INDEPENDENT_AMBULATORY_CARE_PROVIDER_SITE_OTHER): Payer: Self-pay

## 2016-10-06 ENCOUNTER — Other Ambulatory Visit: Payer: Self-pay | Admitting: *Deleted

## 2016-10-06 ENCOUNTER — Ambulatory Visit (INDEPENDENT_AMBULATORY_CARE_PROVIDER_SITE_OTHER): Payer: Medicare Other | Admitting: Physician Assistant

## 2016-10-06 VITALS — BP 114/62 | HR 68 | Ht 70.0 in | Wt 230.8 lb

## 2016-10-06 DIAGNOSIS — Z01818 Encounter for other preprocedural examination: Secondary | ICD-10-CM | POA: Diagnosis not present

## 2016-10-06 DIAGNOSIS — Z7901 Long term (current) use of anticoagulants: Secondary | ICD-10-CM | POA: Diagnosis not present

## 2016-10-06 DIAGNOSIS — Z1211 Encounter for screening for malignant neoplasm of colon: Secondary | ICD-10-CM

## 2016-10-06 DIAGNOSIS — Z1212 Encounter for screening for malignant neoplasm of rectum: Secondary | ICD-10-CM | POA: Diagnosis not present

## 2016-10-06 NOTE — Progress Notes (Signed)
Chief Complaint: Consult for screening colonoscopy, chronic anticoagulation  HPI:  Fernando Combs is a 70 year old Caucasian male with a past medical history of CAD, status post multiple stent placements on chronic anti-coagulation with Plavix, diabetes, DVT, history of MI and PVD, who was referred to me by Dorothyann Peng, NP for a consult of a screening colonoscopy.   Per chart review patient's last colonoscopy was performed in 2006 by Dr. Deatra Ina and recommendations were for repeat in 10 years.    Today, the patient tells me that he is doing fairly well other than "gaining a lot of weight". He tells me that this is due to his late-night ice cream eating, which he has tried to stop. He does tell me he gets some reflux symptoms if he eats too late at night, but other than this has no GI complaints. He is aware that he is overdue for a screening colonoscopy.    Patient denies fever, chills, blood in his stool, melena, change in bowel habits, weight loss, fatigue,nausea, vomiting, dysphagia or abdominal pain.  Past Medical History:  Diagnosis Date  . Benign prostatic hypertrophy   . CAD (coronary artery disease)    cath 05/16/2015 patent LIMA to LAD, SVG to OM, SVG to RPDA, SVG to diagonal was occluded. PTCA 95% prox LAD reduce down to 60%. Medical management  . Carotid artery occlusion   . Colitis, ischemic ([place]) 06/2005    history of  . Diabetes mellitus type II   . Diabetes, polyneuropathy ([place])   . Diabetic peripheral neuropathy ([place])   . DVT (deep venous thrombosis) ([place])   . Gout   . History of myocardial infarction   . History of syncope    most likely vasovagal  . History of tobacco abuse   . Hyperlipidemia   . Hypertension   . Hypothyroidism   . Myocardial infarction   . Peripheral vascular disease ([place])   . Shingles   . Thrombus 04/2008   acute thrombus of his right superficial artery with claudication    Past Surgical History:  Procedure Laterality Date  . CARDIAC  CATHETERIZATION N/A 05/15/2015   Procedure: Left Heart Cath and Cors/Grafts Angiography;  Surgeon: Troy Sine, MD;  Location: [facility];  Service: Cardiovascular;  Laterality: N/A;  . CATARACT EXTRACTION Bilateral   . CORONARY ARTERY BYPASS GRAFT  08/04/00   times 4 (left interanl mammary artery to the left anterior descending, saphenous vein,graft to diagonal, saphenous vein graft to obtuse marginal, spahenous vein graft to posterior descending-Surgeon  Tharon Aquas Tright III  . LOWER EXTREMITY ANGIOGRAM  March 09, 2012  . LOWER EXTREMITY ANGIOGRAM Right 03/09/2012   Procedure: LOWER EXTREMITY ANGIOGRAM;  Surgeon: Serafina Mitchell, MD;  Location: [facility];  Service: Cardiovascular;  Laterality: Right;  . OTHER SURGICAL HISTORY  11/2010   angiogram, right leg stent placement-  -Dr Kellie Simmering  . peripheral vascular surgery    . PR VEIN BYPASS GRAFT,AORTO-FEM-POP  08/04/00    Current Outpatient Prescriptions  Medication Sig Dispense Refill  . allopurinol (ZYLOPRIM) 100 MG tablet Take 2 tablets by mouth  daily 180 tablet 2  . amLODipine (NORVASC) 10 MG tablet Take 1 tablet (10 mg total) by mouth daily. 90 tablet 3  . aspirin 81 MG chewable tablet Chew 1 tablet (81 mg total) by mouth daily.    Marland Kitchen atorvastatin (LIPITOR) 80 MG tablet Take 1 tablet by mouth   daily at 6 p.m. 90 tablet 3  . carvedilol (COREG)  25 MG tablet Take 1 tablet by mouth two  times daily with a meal 180 tablet 3  . clopidogrel (PLAVIX) 75 MG tablet Take 1 tablet (75 mg total) by mouth daily. Please schedule appointment for refills. 90 tablet 0  . gabapentin (NEURONTIN) 100 MG capsule TAKE 1 CAPSULE BY MOUTH 3  TIMES DAILY 270 capsule 3  . isosorbide mononitrate (IMDUR) 60 MG 24 hr tablet Take 1 tablet (60 mg total) by mouth daily. 90 tablet 3  . levothyroxine (SYNTHROID, LEVOTHROID) 88 MCG tablet Take 1 tablet (88 mcg total) by mouth daily. 90 tablet 1  . nitroGLYCERIN (NITROSTAT) 0.4 MG SL tablet DISSOLVE 1 TABLET UNDER  THE TONGUE EVERY 5 MINUTES AS  NEEDED FOR CHEST PAIN FOR  UP TO 3 DOSES. SEEK MEDICAL ATTENTION IF NO RELIEF. 90 tablet 3  . sertraline (ZOLOFT) 25 MG tablet Take 1 tablet by mouth  daily 90 tablet 3  . tamsulosin (FLOMAX) 0.4 MG CAPS capsule Take 1 capsule (0.4 mg total) by mouth daily. 90 capsule 3   No current facility-administered medications for this visit.     Allergies as of 10/06/2016  . (No Known Allergies)    Family History  Problem Relation Age of Onset  . Deep vein thrombosis Mother     Varicose Veins  . Heart disease Mother   . Hyperlipidemia Mother   . Hypertension Mother   . Peripheral vascular disease Mother   . Lung cancer Mother   . Stroke Father   . Deep vein thrombosis Father   . Diabetes Father   . Heart disease Father     Heart Disease  before age 13  . Hyperlipidemia Father   . Hypertension Father   . Heart attack Father   . Peripheral vascular disease Father   . Alcohol abuse    . Stroke    . Coronary artery disease    . Hyperlipidemia    . Deep vein thrombosis Brother   . Diabetes Brother   . Heart disease Brother     Heart Disease before age 47  . Hyperlipidemia Brother   . Hypertension Brother   . Heart attack Brother   . Peripheral vascular disease Brother   . Lung cancer Sister   . Kidney cancer Brother     on hospice   . Other Neg Hx     denies family history of blood clots or blood disorder    Social History   Social History  . Marital status: Married    Spouse name: N/A  . Number of children: N/A  . Years of education: N/A   Occupational History  . Not on file.   Social History Main Topics  . Smoking status: Former Smoker    Years: 15.00    Types: Cigarettes    Quit date: 04/14/2011  . Smokeless tobacco: Never Used  . Alcohol use No  . Drug use: No  . Sexual activity: Not on file   Other Topics Concern  . Not on file   Social History Narrative   Occupation:  Retired from [employer]    Married - lives  with wife (2nd marriage)   3 children     Alcohol use-no   Tobacco Use - Quit smoking 3 years ago           Review of Systems:     Constitutional: Positive for weight gain No weight loss, fever, chills, weakness or fatigue HEENT: Eyes: No change in vision  Ears, Nose, Throat:  No change in hearing or congestion Skin: No rash or itching Cardiovascular: No chest pain, chest pressure or palpitations   Respiratory: No SOB or cough Gastrointestinal: See HPI and otherwise negative Genitourinary: No dysuria or change in urinary frequency Neurological: No headache, dizziness or syncope Musculoskeletal: No new muscle or joint pain Hematologic: No bleeding or bruising Psychiatric: No history of depression or anxiety   Physical Exam:  Vital signs: BP 114/62   Pulse 68   Ht 5\' 10"  (1.778 m)   Wt 230 lb 12.8 oz (104.7 kg)   BMI 33.12 kg/m   General:   Pleasant overweight Caucasian male appears to be in NAD, Well developed, Well nourished, alert and cooperative Head:  Normocephalic and atraumatic. Eyes:   PEERL, EOMI. No icterus. Conjunctiva pink. Ears:  Normal auditory acuity. Neck:  Supple Throat: Oral cavity and pharynx without inflammation, swelling or lesion.  Lungs: Respirations even and unlabored. Lungs clear to auscultation bilaterally.   No wheezes, crackles, or rhonchi.  Heart: Normal S1, S2. No MRG. Regular rate and rhythm. No peripheral edema, cyanosis or pallor.  Abdomen:  Soft, nondistended, nontender. No rebound or guarding. Normal bowel sounds. No appreciable masses or hepatomegaly. Rectal:  Not performed.  Msk:  Symmetrical without gross deformities.  Extremities:  Without edema, no deformity or joint abnormality. Normal ROM. Neurologic:  Alert and  oriented x4;  grossly normal neurologically. Skin:   Dry and intact without significant lesions or rashes. Psychiatric: Oriented to person, place and time. Demonstrates good judgement and reason without abnormal  affect or behaviors.   Assessment: 1.Preprocedural exam prior to screening colonoscopy due to chronic anticoagulation: Last colonoscopy 2006, no polyps, patient now maintained on Plavix for history of CAD and stent placement, last 2 years ago  Plan: 1. Recommend proceeding with a screening colonoscopy. Discussed risks, benefits, limitations and alternatives. This procedure was scheduled with Dr. Silverio Decamp as she is the supervising physician this morning. This is scheduled at the [facility] 2. Patient advised to hold his Plavix for 5 days prior to his colonoscopy. We will communicate with his cardiologist Dr. Stanford Breed to ensure that holding his Plavix is acceptable. 3. Patient to follow in clinic per Dr. Woodward Ku recommendations after time of procedure.  Thank you for your kind consultation, it was a pleasure to see Fernando Combs this morning, please let me know if I can be of any further assistance.  Ellouise Newer, PA-C [facility] 10/06/2016, 10:14 AM  Cc: Dorothyann Peng, NP

## 2016-10-06 NOTE — Telephone Encounter (Signed)
Pt is not on apixaban; plavix can be held prior to procedure if needed (7 days) and resume after Fernando Combs

## 2016-10-06 NOTE — Patient Instructions (Addendum)
You have been scheduled for a colonoscopy. Please follow written instructions given to you at your visit today.  Please pick up your prep supplies at the pharmacy within the next 1-3 days. [facility].  If you use inhalers (even only as needed), please bring them with you on the day of your procedure. Your physician has requested that you go to www.startemmi.com and enter the access code given to you at your visit today. This web site gives a general overview about your procedure. However, you should still follow specific instructions given to you by our office regarding your preparation for the procedure.

## 2016-10-06 NOTE — Telephone Encounter (Signed)
10/06/2016   RE: Fernando Combs DOB: 06-25-1946 MRN: [id number]   Dear Dr. Kirk Ruths,    We have scheduled the above patient for an endoscopic procedure. Our records show that he is on anticoagulation therapy.   Please advise as to how long the patient may come off his therapy of Eliquis prior to the procedure, which is scheduled for 10-29-2016.  Please  route the Eliquis clearance information to [facility] C[facility].    Sincerely,    Ellouise Newer PA-C

## 2016-10-08 NOTE — Telephone Encounter (Signed)
I called and spoke to the patient.  I advised him to stop the Plavix on 12-1 and stay off it through and including 12-6. He can resume it on 12-7.  The patient verbalized understanding the instructions.

## 2016-10-09 ENCOUNTER — Telehealth: Payer: Self-pay | Admitting: Physician Assistant

## 2016-10-09 ENCOUNTER — Other Ambulatory Visit: Payer: Self-pay | Admitting: *Deleted

## 2016-10-09 MED ORDER — NA SULFATE-K SULFATE-MG SULF 17.5-3.13-1.6 GM/177ML PO SOLN: 1.0000 | Freq: Once | ORAL | 0 refills | Status: AC

## 2016-10-09 NOTE — Telephone Encounter (Signed)
Sent electronically and called in the script for the [place] today 10-09-2016.  [facility].

## 2016-10-09 NOTE — Telephone Encounter (Signed)
I spoke to the patient and apologized that I had sent the Suprep to his mail order pharmacy by mistake. I sent it today to [place]. The patient said it was fine and he will go to the pharmacy .

## 2016-10-09 NOTE — Progress Notes (Signed)
Reviewed and agree with documentation and assessment and plan. K. Veena Anniyah Mood , MD   

## 2016-10-25 ENCOUNTER — Other Ambulatory Visit: Payer: Self-pay | Admitting: Family Medicine

## 2016-10-25 DIAGNOSIS — I10 Essential (primary) hypertension: Secondary | ICD-10-CM

## 2016-10-25 DIAGNOSIS — Z760 Encounter for issue of repeat prescription: Secondary | ICD-10-CM

## 2016-10-29 ENCOUNTER — Ambulatory Visit (AMBULATORY_SURGERY_CENTER): Payer: Medicare Other | Admitting: Gastroenterology

## 2016-10-29 ENCOUNTER — Encounter: Payer: Self-pay | Admitting: Gastroenterology

## 2016-10-29 VITALS — BP 109/73 | HR 63 | Temp 97.8°F | Resp 17 | Ht 70.0 in | Wt 230.0 lb

## 2016-10-29 DIAGNOSIS — Z1212 Encounter for screening for malignant neoplasm of rectum: Secondary | ICD-10-CM

## 2016-10-29 DIAGNOSIS — D120 Benign neoplasm of cecum: Secondary | ICD-10-CM | POA: Diagnosis not present

## 2016-10-29 DIAGNOSIS — I739 Peripheral vascular disease, unspecified: Secondary | ICD-10-CM | POA: Diagnosis not present

## 2016-10-29 DIAGNOSIS — Z1211 Encounter for screening for malignant neoplasm of colon: Secondary | ICD-10-CM | POA: Diagnosis present

## 2016-10-29 DIAGNOSIS — D122 Benign neoplasm of ascending colon: Secondary | ICD-10-CM | POA: Diagnosis not present

## 2016-10-29 DIAGNOSIS — D123 Benign neoplasm of transverse colon: Secondary | ICD-10-CM

## 2016-10-29 LAB — GLUCOSE, CAPILLARY
GLUCOSE-CAPILLARY: 163 mg/dL — AB (ref 65–99)
GLUCOSE-CAPILLARY: 131 mg/dL — AB (ref 65–99)

## 2016-10-29 MED ORDER — SODIUM CHLORIDE 0.9 % IV SOLN: 500.0000 mL | INTRAVENOUS | Status: DC

## 2016-10-29 NOTE — Progress Notes (Signed)
Called to room to assist during endoscopic procedure.  Patient ID and intended procedure confirmed with present staff. Received instructions for my participation in the procedure from the performing physician.  

## 2016-10-29 NOTE — Op Note (Signed)
[facility] Patient Name: Fernando Combs Procedure Date: 10/29/2016 11:11 AM MRN: [id number] Endoscopist: Mauri Pole , MD Age: 70 Referring MD:  Date of Birth: 1946/10/04 Gender: Male Account #: [id number] Procedure:                Colonoscopy Indications:              Screening for colorectal malignant neoplasm, Last                            colonoscopy: 2006 Medicines:                Monitored Anesthesia Care Procedure:                Pre-Anesthesia Assessment:                           - Prior to the procedure, a History and Physical                            was performed, and patient medications and                            allergies were reviewed. The patient's tolerance of                            previous anesthesia was also reviewed. The risks                            and benefits of the procedure and the sedation                            options and risks were discussed with the patient.                            All questions were answered, and informed consent                            was obtained. Prior Anticoagulants: The patient                            last took Plavix (clopidogrel) 5 days prior to the                            procedure. ASA Grade Assessment: III - A patient                            with severe systemic disease. After reviewing the                            risks and benefits, the patient was deemed in                            satisfactory condition to undergo the procedure.  After obtaining informed consent, the colonoscope                            was passed under direct vision. Throughout the                            procedure, the patient's blood pressure, pulse, and                            oxygen saturations were monitored continuously. The                            Model CF-HQ190L [id number]) scope was introduced                            through the anus and advanced to  the the cecum,                            identified by appendiceal orifice and ileocecal                            valve. The colonoscopy was somewhat difficult due                            to inadequate bowel prep, the patient's oxygen                            desaturation and ineffective sedation. Successful                            completion of the procedure was aided by decreasing                            the dose of sedation medication. The patient                            tolerated the procedure well. The quality of the                            bowel preparation was inadequate. The ileocecal                            valve, appendiceal orifice, and rectum were                            photographed. Scope In: 11:22:12 AM Scope Out: 12:02:17 PM Scope Withdrawal Time: 0 hours 34 minutes 20 seconds  Total Procedure Duration: 0 hours 40 minutes 5 seconds  Findings:                 The perianal and digital rectal examinations were                            normal.  A 2 mm polyp was found in the cecum. The polyp was                            sessile. The polyp was removed with a cold biopsy                            forceps. Resection and retrieval were complete.                           Seven sessile polyps were found in the transverse                            colon and ascending colon. The polyps were 4 to 7                            mm in size. These polyps were removed with a cold                            snare. Resection and retrieval were complete.                           A 16 mm polyp was found in the transverse colon.                            The polyp was flat. Preparations were made for                            mucosal resection. Saline was injected to raise the                            lesion. Snare mucosal resection was performed.                            Resection was incomplete. Patient was contantly                             coughing and had an episode of oxygen desaturation                            <90, had to terminate the procedure. The resected                            tissue was retrieved. Complications:            No immediate complications. Estimated Blood Loss:     Estimated blood loss was minimal. Impression:               - Preparation of the colon was inadequate.                           - One 2 mm polyp in the cecum, removed with a cold  biopsy forceps. Resected and retrieved.                           - Seven 4 to 7 mm polyps in the cecum x2 transverse                            colon x3 and in the ascending colon x2, removed                            with a cold snare. Resected and retrieved.                           - One 16 mm polyp in the transverse colon, removed                            with mucosal resection. Incomplete resection.                            Resected tissue retrieved.                           - Mucosal resection was performed. Resection was                            incomplete. The resected tissue was retrieved. Recommendation:           - Patient has a contact number available for                            emergencies. The signs and symptoms of potential                            delayed complications were discussed with the                            patient. Return to normal activities tomorrow.                            Written discharge instructions were provided to the                            patient.                           - Resume previous diet.                           - Continue present medications.                           - Resume Plavix (clopidogrel) at prior dose in 2                            days. Refer to managing physician for further  adjustment of therapy.                           - Await pathology results.                           - Repeat colonoscopy in 6 months  to be scheduled at                            [place] because the bowel preparation was                            suboptimal, for surveillance after piecemeal                            polypectomy and for surveillance of multiple polyps.                           - Return to GI clinic PRN.                           - For future colonoscopy the patient will require                            an extended preparation. If there are any                            questions, please contact the gastroenterologist. Mauri Pole, MD 10/29/2016 12:12:47 PM This report has been signed electronically.

## 2016-10-29 NOTE — Patient Instructions (Signed)
YOU HAD AN ENDOSCOPIC PROCEDURE TODAY AT [facility]:   Refer to the procedure report that was given to you for any specific questions about what was found during the examination.  If the procedure report does not answer your questions, please call your gastroenterologist to clarify.  If you requested that your care partner not be given the details of your procedure findings, then the procedure report has been included in a sealed envelope for you to review at your convenience later.  YOU SHOULD EXPECT: Some feelings of bloating in the abdomen. Passage of more gas than usual.  Walking can help get rid of the air that was put into your GI tract during the procedure and reduce the bloating. If you had a lower endoscopy (such as a colonoscopy or flexible sigmoidoscopy) you may notice spotting of blood in your stool or on the toilet paper. If you underwent a bowel prep for your procedure, you may not have a normal bowel movement for a few days.  Please Note:  You might notice some irritation and congestion in your nose or some drainage.  This is from the oxygen used during your procedure.  There is no need for concern and it should clear up in a day or so.  SYMPTOMS TO REPORT IMMEDIATELY:   Following lower endoscopy (colonoscopy or flexible sigmoidoscopy):  Excessive amounts of blood in the stool  Significant tenderness or worsening of abdominal pains  Swelling of the abdomen that is new, acute  Fever of 100F or higher  tools  For urgent or emergent issues, a gastroenterologist can be reached at any hour by calling [phone].  Please read all handouts given to you by your recovery Rn. Resume Plavix on Friday.  DIET:  We do recommend a small meal at first, but then you may proceed to your regular diet.  Drink plenty of fluids but you should avoid alcoholic beverages for 24 hours.  ACTIVITY:  You should plan to take it easy for the rest of today and you should NOT DRIVE or  use heavy machinery until tomorrow (because of the sedation medicines used during the test).    FOLLOW UP: Our staff will call the number listed on your records the next business day following your procedure to check on you and address any questions or concerns that you may have regarding the information given to you following your procedure. If we do not reach you, we will leave a message.  However, if you are feeling well and you are not experiencing any problems, there is no need to return our call.  We will assume that you have returned to your regular daily activities without incident.  If any biopsies were taken you will be contacted by phone or by letter within the next 1-3 weeks.  Please call us at [phone] if you have not heard about the biopsies in 3 weeks.    SIGNATURES/CONFIDENTIALITY: You and/or your care partner have signed paperwork which will be entered into your electronic medical record.  These signatures attest to the fact that that the information above on your After Visit Summary has been reviewed and is understood.  Full responsibility of the confidentiality of this discharge information lies with you and/or your care-partner.  Thank you for letting us take care of your healthcare needs today.

## 2016-10-29 NOTE — Progress Notes (Signed)
To PACU, vss patent aw report to rn 

## 2016-10-30 ENCOUNTER — Telehealth: Payer: Self-pay

## 2016-10-30 NOTE — Telephone Encounter (Signed)
  Follow up Call-  Call back number 10/29/2016  Post procedure Call Back phone  # [phone]  Permission to leave phone message Yes  Some recent data might be hidden    Patient was called for follow up after his procedure on 10/29/2016. I spoke with the patients wife and she reports that Fernando Combs has returned to her normal daily activities without any complications.

## 2016-11-05 ENCOUNTER — Encounter: Payer: Self-pay | Admitting: Gastroenterology

## 2016-12-16 ENCOUNTER — Encounter: Payer: Self-pay | Admitting: Family

## 2016-12-17 ENCOUNTER — Encounter: Payer: Self-pay | Admitting: *Deleted

## 2016-12-17 ENCOUNTER — Ambulatory Visit (INDEPENDENT_AMBULATORY_CARE_PROVIDER_SITE_OTHER)
Admission: RE | Admit: 2016-12-17 | Discharge: 2016-12-17 | Disposition: A | Discharge status: Home / Self Care | Payer: Medicare Other | Source: Ambulatory Visit | Attending: Family | Admitting: Family

## 2016-12-17 ENCOUNTER — Ambulatory Visit: Payer: Medicare Other | Admitting: Family

## 2016-12-17 ENCOUNTER — Ambulatory Visit (HOSPITAL_COMMUNITY)
Admission: RE | Admit: 2016-12-17 | Discharge: 2016-12-17 | Disposition: A | Discharge status: Home / Self Care | Payer: Medicare Other | Source: Ambulatory Visit | Attending: Family | Admitting: Family

## 2016-12-17 DIAGNOSIS — I739 Peripheral vascular disease, unspecified: Secondary | ICD-10-CM | POA: Diagnosis not present

## 2016-12-17 DIAGNOSIS — I6523 Occlusion and stenosis of bilateral carotid arteries: Secondary | ICD-10-CM | POA: Diagnosis not present

## 2016-12-17 DIAGNOSIS — Z95820 Peripheral vascular angioplasty status with implants and grafts: Secondary | ICD-10-CM | POA: Diagnosis not present

## 2016-12-17 DIAGNOSIS — I743 Embolism and thrombosis of arteries of the lower extremities: Secondary | ICD-10-CM | POA: Diagnosis not present

## 2016-12-17 LAB — VAS US CAROTID
LCCADDIAS: 16 cm/s
LEFT ECA DIAS: -23 cm/s
LEFT VERTEBRAL DIAS: 11 cm/s
Left CCA dist sys: 75 cm/s
Left CCA prox dias: 15 cm/s
Left CCA prox sys: 112 cm/s
RCCADSYS: -64 cm/s
RCCAPSYS: 101 cm/s
RIGHT CCA MID DIAS: 15 cm/s
RIGHT VERTEBRAL DIAS: 5 cm/s
Right CCA prox dias: 14 cm/s

## 2016-12-22 ENCOUNTER — Ambulatory Visit (INDEPENDENT_AMBULATORY_CARE_PROVIDER_SITE_OTHER): Payer: Medicare Other | Admitting: Family

## 2016-12-22 ENCOUNTER — Encounter: Payer: Self-pay | Admitting: Family

## 2016-12-22 VITALS — BP 102/50 | HR 70 | Temp 97.4°F | Resp 18 | Ht 69.0 in | Wt 224.0 lb

## 2016-12-22 DIAGNOSIS — Z959 Presence of cardiac and vascular implant and graft, unspecified: Secondary | ICD-10-CM | POA: Diagnosis not present

## 2016-12-22 DIAGNOSIS — Z95820 Peripheral vascular angioplasty status with implants and grafts: Secondary | ICD-10-CM

## 2016-12-22 DIAGNOSIS — I6523 Occlusion and stenosis of bilateral carotid arteries: Secondary | ICD-10-CM

## 2016-12-22 DIAGNOSIS — I779 Disorder of arteries and arterioles, unspecified: Secondary | ICD-10-CM | POA: Diagnosis not present

## 2016-12-22 DIAGNOSIS — Z87891 Personal history of nicotine dependence: Secondary | ICD-10-CM | POA: Diagnosis not present

## 2016-12-22 NOTE — Progress Notes (Signed)
[facility] [place] HISTORY AND PHYSICAL   MRN : [id number]  History of Present Illness:   Fernando Combs is a 71 y.o. male patient of Dr. Kellie Simmering who underwent atherectomy and angioplasty of the stent in the right SFA in April 2013. The patient is status post right SFA to popliteal thrombectomy in 2009 with the placement of the right SFA stent in January 2012.  He also has a history of mild carotid artery stenosis. He returns today for yearly follow up.  He retired in 2015, worked at a [employer]. He denies claudication symptoms with walking, denies non-healing wounds. Denies ever having a stroke or TIA symptoms. He denies tingling, numbness, pain, or weakness in either UE, denies dizziness.  He reports burning and numbness in all toes, states this has worsened.   He had an MI in June 2016, had a left heart cath by Dr. Shelva Majestic at that time.  Pt Diabetic: Yes, 7.0 A1C on 09-25-16 (review of records) Pt smoker: former smoker, quit in 2015  Pt meds include: Statin :Yes Betablocker: Yes ASA: Yes Other anticoagulants/antiplatelets: Plavix since his MI in June 2016    Current Outpatient Prescriptions  Medication Fernando Dispense Refill  . allopurinol (ZYLOPRIM) 100 MG tablet Take 2 tablets by mouth  daily 180 tablet 2  . amLODipine (NORVASC) 10 MG tablet TAKE 1 TABLET BY MOUTH  DAILY 90 tablet 1  . aspirin 81 MG chewable tablet Chew 1 tablet (81 mg total) by mouth daily.    Marland Kitchen atorvastatin (LIPITOR) 80 MG tablet Take 1 tablet by mouth   daily at 6 p.m. 90 tablet 3  . carvedilol (COREG) 25 MG tablet Take 1 tablet by mouth two  times daily with a meal 180 tablet 3  . clopidogrel (PLAVIX) 75 MG tablet Take 1 tablet (75 mg total) by mouth daily. Please schedule appointment for refills. 90 tablet 0  . gabapentin (NEURONTIN) 100 MG capsule TAKE 1 CAPSULE BY MOUTH 3  TIMES DAILY 270 capsule 3  . isosorbide mononitrate (IMDUR) 60 MG 24 hr tablet Take 1 tablet  (60 mg total) by mouth daily. 90 tablet 3  . levothyroxine (SYNTHROID, LEVOTHROID) 88 MCG tablet Take 1 tablet (88 mcg total) by mouth daily. 90 tablet 1  . nitroGLYCERIN (NITROSTAT) 0.4 MG SL tablet DISSOLVE 1 TABLET UNDER THE TONGUE EVERY 5 MINUTES AS  NEEDED FOR CHEST PAIN FOR  UP TO 3 DOSES. SEEK MEDICAL ATTENTION IF NO RELIEF. 90 tablet 3  . sertraline (ZOLOFT) 25 MG tablet Take 1 tablet by mouth  daily 90 tablet 3  . tamsulosin (FLOMAX) 0.4 MG CAPS capsule Take 1 capsule (0.4 mg total) by mouth daily. 90 capsule 3   Current Facility-Administered Medications  Medication Dose Route Frequency Provider Last Rate Last Dose  . 0.9 %  sodium chloride infusion  500 mL Intravenous Continuous Mauri Pole, MD        Past Medical History:  Diagnosis Date  . Benign prostatic hypertrophy   . CAD (coronary artery disease)    cath 05/16/2015 patent LIMA to LAD, SVG to OM, SVG to RPDA, SVG to diagonal was occluded. PTCA 95% prox LAD reduce down to 60%. Medical management  . Carotid artery occlusion   . Colitis, ischemic ([place]) 06/2005    history of  . Diabetes mellitus type II   . Diabetes, polyneuropathy ([place])   . Diabetic peripheral neuropathy ([place])   . DVT (deep venous thrombosis) ([place])   .  Gout   . History of myocardial infarction   . History of syncope    most likely vasovagal  . History of tobacco abuse   . Hyperlipidemia   . Hypertension   . Hypothyroidism   . Myocardial infarction   . Peripheral vascular disease ([place])   . Shingles   . Thrombus 04/2008   acute thrombus of his right superficial artery with claudication    Social History Social History  Substance Use Topics  . Smoking status: Former Smoker    Years: 15.00    Types: Cigarettes    Quit date: 04/14/2011  . Smokeless tobacco: Never Used  . Alcohol use No    Family History Family History  Problem Relation Age of Onset  . Deep vein thrombosis Mother     Varicose Veins  . Heart disease Mother   .  Hyperlipidemia Mother   . Hypertension Mother   . Peripheral vascular disease Mother   . Lung cancer Mother   . Stroke Father   . Deep vein thrombosis Father   . Diabetes Father   . Heart disease Father     Heart Disease  before age 44  . Hyperlipidemia Father   . Hypertension Father   . Heart attack Father   . Peripheral vascular disease Father   . Alcohol abuse    . Stroke    . Coronary artery disease    . Hyperlipidemia    . Deep vein thrombosis Brother   . Diabetes Brother   . Heart disease Brother     Heart Disease before age 58  . Hyperlipidemia Brother   . Hypertension Brother   . Heart attack Brother   . Peripheral vascular disease Brother   . Lung cancer Sister   . Kidney cancer Brother     on hospice   . Other Neg Hx     denies family history of blood clots or blood disorder    Surgical History Past Surgical History:  Procedure Laterality Date  . CARDIAC CATHETERIZATION N/A 05/15/2015   Procedure: Left Heart Cath and Cors/Grafts Angiography;  Surgeon: Troy Sine, MD;  Location: [facility];  Service: Cardiovascular;  Laterality: N/A;  . CATARACT EXTRACTION Bilateral   . CORONARY ARTERY BYPASS GRAFT  08/04/00   times 4 (left interanl mammary artery to the left anterior descending, saphenous vein,graft to diagonal, saphenous vein graft to obtuse marginal, spahenous vein graft to posterior descending-Surgeon  Tharon Aquas Tright III  . LOWER EXTREMITY ANGIOGRAM  March 09, 2012  . LOWER EXTREMITY ANGIOGRAM Right 03/09/2012   Procedure: LOWER EXTREMITY ANGIOGRAM;  Surgeon: Serafina Mitchell, MD;  Location: [facility] CATH LAB;  Service: Cardiovascular;  Laterality: Right;  . OTHER SURGICAL HISTORY  11/2010   angiogram, right leg stent placement-  -Dr Kellie Simmering  . peripheral vascular surgery    . PR VEIN BYPASS GRAFT,AORTO-FEM-POP  08/04/00    No Known Allergies  Current Outpatient Prescriptions  Medication Fernando Dispense Refill  . allopurinol (ZYLOPRIM) 100 MG tablet  Take 2 tablets by mouth  daily 180 tablet 2  . amLODipine (NORVASC) 10 MG tablet TAKE 1 TABLET BY MOUTH  DAILY 90 tablet 1  . aspirin 81 MG chewable tablet Chew 1 tablet (81 mg total) by mouth daily.    Marland Kitchen atorvastatin (LIPITOR) 80 MG tablet Take 1 tablet by mouth   daily at 6 p.m. 90 tablet 3  . carvedilol (COREG) 25 MG tablet Take 1 tablet by mouth two  times  daily with a meal 180 tablet 3  . clopidogrel (PLAVIX) 75 MG tablet Take 1 tablet (75 mg total) by mouth daily. Please schedule appointment for refills. 90 tablet 0  . gabapentin (NEURONTIN) 100 MG capsule TAKE 1 CAPSULE BY MOUTH 3  TIMES DAILY 270 capsule 3  . isosorbide mononitrate (IMDUR) 60 MG 24 hr tablet Take 1 tablet (60 mg total) by mouth daily. 90 tablet 3  . levothyroxine (SYNTHROID, LEVOTHROID) 88 MCG tablet Take 1 tablet (88 mcg total) by mouth daily. 90 tablet 1  . nitroGLYCERIN (NITROSTAT) 0.4 MG SL tablet DISSOLVE 1 TABLET UNDER THE TONGUE EVERY 5 MINUTES AS  NEEDED FOR CHEST PAIN FOR  UP TO 3 DOSES. SEEK MEDICAL ATTENTION IF NO RELIEF. 90 tablet 3  . sertraline (ZOLOFT) 25 MG tablet Take 1 tablet by mouth  daily 90 tablet 3  . tamsulosin (FLOMAX) 0.4 MG CAPS capsule Take 1 capsule (0.4 mg total) by mouth daily. 90 capsule 3   Current Facility-Administered Medications  Medication Dose Route Frequency Provider Last Rate Last Dose  . 0.9 %  sodium chloride infusion  500 mL Intravenous Continuous Kavitha Nandigam V, MD         REVIEW OF SYSTEMS: See HPI for pertinent positives and negatives.  Physical Examination Vitals:   12/22/16 0936  BP: (!) 102/50  Pulse: 70  Resp: 18  Temp: 97.4 F (36.3 C)  TempSrc: Oral  SpO2: 96%  Weight: 224 lb (101.6 kg)  Height: 5\' 9"  (1.753 m)   Body mass index is 33.08 kg/m.  General:  WDWN obese male in NAD Gait: Normal HENT: WNL Eyes: Pupils equal Pulmonary: normal non-labored breathing, no rales, rhonchi,or wheezing Cardiac: RRR, no detected murmur  Abdomen: soft, NT,  no palpable masses Skin: no rashes, no ulcers, no cellulitis.  VASCULAR EXAM  Carotid Bruits Left Right   Negative Negative   Aorta is not palpable. Radial pulses are 2+ and equal.   VASCULAR EXAM: Extremities without ischemic changes  without Gangrene; without open wounds.     LE Pulses LEFT RIGHT   FEMORAL  3+palpable  3+palpable    POPLITEAL not palpable  not palpable   POSTERIOR TIBIAL  2+ palpable  not palpable    DORSALIS PEDIS  ANTERIOR TIBIAL 1+ palpable  2+palpable    Musculoskeletal: no muscle wasting or atrophy; no peripheral edema Neurologic: A&O X 3; Appropriate Affect ;  SENSATION: normal; MOTOR FUNCTION: 5/5 Symmetric, CN 2-12 intact Speech is fluent/normal      ASSESSMENT:  GORGE KEHR is a 71 y.o. male who is s/p atherectomy and angioplasty of the stent in the right SFA in April 2013, and right SFA to popliteal thrombectomy in 2009 with placement of right SFA stent in January 2012.  He also has a history of mild carotid artery stenosis. He has no claudication symptoms, no signs of ischemia in his feet/legs.  He has no stroke or TIA history.  His atherosclerotic risk factors include currently controlled DM, former smoker, and CAD.  DATA Today's carotid duplex suggests <40% stenosis of the bilateral internal carotid artery. Bilateral vertebral artery flow is antegrade.  Bilateral subclavian artery waveforms are normal.  No significant change compared to last exam on 12-14-15.   Today's right LE arterial duplex suggests right superficial femoral artery stent with 50%-99% stenosis just beyond the stent origin (286 cm/s) (was 266 cm/s at same area on 12-14-15). No significant change compared to last exam  on 12-14-15.  ABI in both  legs remain normal with biphasic waveforms in the right leg, triphasic in the left leg. Bilateral TBI's remain normal.    PLAN:   Graduated walking program discussed and how to achieve. Based on today's exam and non-invasive vascular lab results, the patient will follow up in 1 year with the following tests:  ABI's, and right LE arterial Duplex, carotid duplex in 3 years.   I discussed in depth with the patient the nature of atherosclerosis, and emphasized the importance of maximal medical management including strict control of blood pressure, blood glucose, and lipid levels, obtaining regular exercise, and cessation of smoking.  The patient is aware that without maximal medical management the underlying atherosclerotic disease process will progress, limiting the benefit of any interventions.  The patient was given information about stroke prevention and what symptoms should prompt the patient to seek immediate medical care.  The patient was given information about PAD including signs, symptoms, treatment, what symptoms should prompt the patient to seek immediate medical care, and risk reduction measures to take. Thank you for allowing [place] to participate in this patient's care.  Clemon Chambers, RN, MSN, FNP-C [facility] Office: [phone]  Clinic MD: Trula Slade 12/22/2016 9:47 AM

## 2016-12-22 NOTE — Patient Instructions (Signed)

## 2016-12-24 NOTE — Addendum Note (Signed)
Addended by: Lianne Cure A on: 12/24/2016 04:51 PM   Modules accepted: Orders

## 2016-12-26 ENCOUNTER — Other Ambulatory Visit: Payer: Self-pay | Admitting: Cardiology

## 2016-12-31 ENCOUNTER — Ambulatory Visit: Payer: Medicare Other | Admitting: Adult Health

## 2017-03-10 ENCOUNTER — Encounter: Payer: Self-pay | Admitting: Gastroenterology

## 2017-04-14 ENCOUNTER — Other Ambulatory Visit: Payer: Self-pay | Admitting: Adult Health

## 2017-04-14 DIAGNOSIS — I10 Essential (primary) hypertension: Secondary | ICD-10-CM

## 2017-04-14 DIAGNOSIS — Z760 Encounter for issue of repeat prescription: Secondary | ICD-10-CM

## 2017-04-14 DIAGNOSIS — E038 Other specified hypothyroidism: Secondary | ICD-10-CM

## 2017-06-19 ENCOUNTER — Other Ambulatory Visit: Payer: Self-pay | Admitting: Cardiology

## 2017-06-19 ENCOUNTER — Other Ambulatory Visit: Payer: Self-pay | Admitting: Internal Medicine

## 2017-06-19 ENCOUNTER — Other Ambulatory Visit: Payer: Self-pay | Admitting: Adult Health

## 2017-06-19 DIAGNOSIS — I10 Essential (primary) hypertension: Secondary | ICD-10-CM

## 2017-06-19 DIAGNOSIS — Z760 Encounter for issue of repeat prescription: Secondary | ICD-10-CM

## 2017-06-19 DIAGNOSIS — N400 Enlarged prostate without lower urinary tract symptoms: Secondary | ICD-10-CM

## 2017-06-19 NOTE — Telephone Encounter (Signed)
DENIED.  FILLED FOR 1 YEAR 07/2016.  REQUEST IS TOO EARLY.   MESSAGE SENT TO THE PHARMACY.

## 2017-08-26 NOTE — Progress Notes (Signed)
Subjective:   Fernando Combs is a 71 y.o. male who presents for Medicare Annual/Subsequent preventive examination.  The Patient was informed that the wellness visit is to identify future health risk and educate and initiate measures that can reduce risk for increased disease through the lifespan.    Annual Wellness Assessment  Reports health as good   Preventive Screening -Counseling & Management  Medicare Annual Preventive Care Visit - Subsequent Last OV 09/2016 with [facility] (NEEDS TO SCHEDULE APT WITH CORY N)  Will make an apt with Tommi Rumps in Nov   Tobacco abuse; quit 2012 with 15 pack year hx  Health Maintenance Due  Topic Date Due  . Hepatitis C Screening  [birth year]  . OPHTHALMOLOGY EXAM  07/13/1956  . HEMOGLOBIN A1C  03/25/2017  . 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 Has a son who is an addict; lives with him To be going into treatment  Lost wife and kids  Has gone to alanon  Does talk to a counselor  He was a Engineer, structural    Stressors: free will     Cardiac Risk Factors Addressed Family hx positive  Tobacco abuse MI  Hyperlipidemia - ratio 3; chol 110 hdl 39; ldl 37 and trig 166  Diabetes A1c 7.0 09/2016 Was diet controlled  On 09/2016   Advanced Directives - no but wife knows his wishes   Patient Care Team: Dorothyann Peng, NP as PCP - General (Family Medicine) Troy Sine, MD as Consulting Physician (Cardiology) Gean Birchwood, DPM as Consulting Physician (Podiatry) Stanford Breed Denice Bors, MD as Consulting Physician (Cardiology)          Objective:    Vitals: BP (!) 96/56   Pulse 67   Ht 5\' 10"  (1.778 m)   Wt 223 lb (101.2 kg)   SpO2 98%   BMI 32.00 kg/m   Body mass index is 32 kg/m.  Tobacco History  Smoking Status  . Former Smoker  . Years: 15.00  . Types: Cigarettes  . Quit date: 04/14/2011  Smokeless Tobacco  . Never Used     Counseling given: Yes   Past Medical History:  Diagnosis Date  . Benign prostatic hypertrophy   . CAD (coronary artery disease)    cath 05/16/2015 patent LIMA to LAD, SVG to OM, SVG to RPDA, SVG to diagonal was occluded. PTCA 95% prox LAD reduce down to 60%. Medical management  . Carotid artery occlusion   . Colitis, ischemic ([place]) 06/2005    history of  . Diabetes mellitus type II   . Diabetes, polyneuropathy ([place])   . Diabetic peripheral neuropathy ([place])   . DVT (deep venous thrombosis) ([place])   . Gout   . History of myocardial infarction   . History of syncope    most likely vasovagal  . History of tobacco abuse   . Hyperlipidemia   . Hypertension   . Hypothyroidism   . Myocardial infarction ([place])   . Peripheral vascular disease ([place])   .  Shingles   . Thrombus 04/2008   acute thrombus of his right superficial artery with claudication   Past Surgical History:  Procedure Laterality Date  . CARDIAC CATHETERIZATION N/A 05/15/2015   Procedure: Left Heart Cath and Cors/Grafts Angiography;  Surgeon: Troy Sine, MD;  Location: [facility];  Service: Cardiovascular;  Laterality: N/A;  . CATARACT EXTRACTION Bilateral   . CORONARY ARTERY BYPASS GRAFT  08/04/00   times 4 (left interanl mammary artery to the left anterior descending, saphenous vein,graft to diagonal, saphenous vein graft to obtuse marginal, spahenous vein graft to posterior descending-Surgeon  Tharon Aquas Tright III  . LOWER EXTREMITY ANGIOGRAM  March 09, 2012  . LOWER EXTREMITY ANGIOGRAM Right 03/09/2012   Procedure: LOWER EXTREMITY ANGIOGRAM;  Surgeon: Serafina Mitchell, MD;  Location: [facility] C[facility];   Service: Cardiovascular;  Laterality: Right;  . OTHER SURGICAL HISTORY  11/2010   angiogram, right leg stent placement-  -Dr Kellie Simmering  . peripheral vascular surgery    . PR VEIN BYPASS GRAFT,AORTO-FEM-POP  08/04/00   Family History  Problem Relation Age of Onset  . Deep vein thrombosis Mother        Varicose Veins  . Heart disease Mother   . Hyperlipidemia Mother   . Hypertension Mother   . Peripheral vascular disease Mother   . Lung cancer Mother   . Stroke Father   . Deep vein thrombosis Father   . Diabetes Father   . Heart disease Father        Heart Disease  before age 22  . Hyperlipidemia Father   . Hypertension Father   . Heart attack Father   . Peripheral vascular disease Father   . Alcohol abuse Unknown   . Stroke Unknown   . Coronary artery disease Unknown   . Hyperlipidemia Unknown   . Deep vein thrombosis Brother   . Diabetes Brother   . Heart disease Brother        Heart Disease before age 21  . Hyperlipidemia Brother   . Hypertension Brother   . Heart attack Brother   . Peripheral vascular disease Brother   . Lung cancer Sister   . Kidney cancer Brother        on hospice   . Other Neg Hx        denies family history of blood clots or blood disorder   History  Sexual Activity  . Sexual activity: Not on file    Outpatient Encounter Prescriptions as of 08/27/2017  Medication Sig  . allopurinol (ZYLOPRIM) 100 MG tablet TAKE 2 TABLETS BY MOUTH  DAILY  . amLODipine (NORVASC) 10 MG tablet TAKE 1 TABLET BY MOUTH  DAILY  . aspirin 81 MG chewable tablet Chew 1 tablet (81 mg total) by mouth daily.  Marland Kitchen atorvastatin (LIPITOR) 80 MG tablet TAKE 1 TABLET BY MOUTH  DAILY AT 6 P.M.  . carvedilol (COREG) 25 MG tablet Take 1 tablet by mouth two  times daily with a meal  . clopidogrel (PLAVIX) 75 MG tablet TAKE 1 TABLET BY MOUTH  DAILY  . gabapentin (NEURONTIN) 100 MG capsule TAKE 1 CAPSULE BY MOUTH 3  TIMES DAILY  . isosorbide mononitrate (IMDUR) 60 MG 24 hr tablet TAKE 1  TABLET BY MOUTH  DAILY  . levothyroxine (SYNTHROID, LEVOTHROID) 88 MCG tablet TAKE 1 TABLET BY MOUTH  DAILY  . nitroGLYCERIN (NITROSTAT) 0.4 MG SL tablet DISSOLVE 1 TABLET UNDER THE TONGUE EVERY 5 MINUTES AS  NEEDED FOR CHEST PAIN FOR  UP TO 3 DOSES. SEEK MEDICAL ATTENTION IF NO RELIEF.  Marland Kitchen sertraline (ZOLOFT) 25 MG tablet TAKE 1 TABLET BY MOUTH  DAILY  . tamsulosin (FLOMAX) 0.4 MG CAPS capsule Take 1 capsule (0.4 mg total) by mouth daily.   Facility-Administered Encounter Medications as of 08/27/2017  Medication  . 0.9 %  sodium chloride infusion    Activities of Daily Living In your present state of health, do you have any difficulty performing the following activities: 08/27/2017  Hearing? N  Vision? N  Difficulty concentrating or making decisions? N  Comment has a very good memory   Walking or climbing stairs? N  Dressing or bathing? N  Doing errands, shopping? N  Preparing Food and eating ? N  Using the Toilet? N  In the past six months, have you accidently leaked urine? Y  Do you have problems with loss of bowel control? N  Managing your Medications? N  Managing your Finances? N  Housekeeping or managing your Housekeeping? N  Some recent data might be hidden    Patient Care Team: Dorothyann Peng, NP as PCP - General (Family Medicine) Troy Sine, MD as Consulting Physician (Cardiology) Gean Birchwood, DPM as Consulting Physician (Podiatry) Stanford Breed Denice Bors, MD as Consulting Physician (Cardiology)   Assessment:     Exercise Activities and Dietary recommendations   does not exercise now but will start walking more   Goals    . Exercise 150 minutes per week (moderate activity)          Will start walking; 1/4 mile  Daily  Take care of you       Fall Risk Fall Risk  08/27/2017 09/30/2016 06/08/2015 01/30/2014 01/30/2014  Falls in the past year? No No No No No   Depression Screen PHQ 2/9 Scores 08/27/2017 08/27/2017 09/30/2016 06/08/2015  PHQ - 2 Score 0 0 0 0  PHQ-  9 Score - - - -    Cognitive Function MMSE - Mini Mental State Exam 08/27/2017  Not completed: (No Data)   Ad8 score reviewed for issues:  Issues making decisions:  Less interest in hobbies / activities:  Repeats questions, stories (family complaining):  Trouble using ordinary gadgets (microwave, computer, phone):  Forgets the month or year:   Mismanaging finances:   Remembering appts:  Daily problems with thinking and/or memory: Ad8 score is=0          Immunization History  Administered Date(s) Administered  . Influenza Whole 09/13/2008, 09/12/2009, 10/08/2010  . Influenza, High Dose Seasonal PF 12/05/2015, 08/07/2016  . Influenza,inj,Quad PF,6+ Mos 09/01/2014  . Influenza-Unspecified 09/24/2013  . Pneumococcal Conjugate-13 06/30/2014  . Pneumococcal Polysaccharide-23 09/26/2008, 09/29/2009, 12/05/2015  . Td 06/30/2014   Screening Tests Health Maintenance  Topic Date Due  . Hepatitis C Screening  13-Apr-1946  . OPHTHALMOLOGY EXAM  07/13/1956  . HEMOGLOBIN A1C  03/25/2017  . INFLUENZA VACCINE  06/24/2017  . URINE MICROALBUMIN  09/25/2017  . FOOT EXAM  09/30/2017  . COLONOSCOPY  10/29/2017  . TETANUS/TDAP  06/30/2024  . PNA vac Low Risk Adult  Completed      Plan:     PCP Notes   Health Maintenance Had high does flu vaccine today Agrees to hep c at the next blood draw Has not had eye exam but sees well; advised to get eyes check this year A1c postponed until his annual with [facility] Forest in November, will schedule today   Advanced directive, no but declines info Wife knows his directives  Abnormal Screens  none  Referrals  None; is very stressed over son who is an addict. He is suppose to go into treatment tomorrow    Patient concerns; As noted;   Nurse Concerns; The patient is in counseling due to son; trying to take care of himself; gained weight and not exercising; Misses work;   Next PCP apt To schedule annual with Tal[facility] Forest today  Aware  A1c is elevated     I have personally reviewed and noted the following in the patient's chart:   . Medical and social history . Use of alcohol, tobacco or illicit drugs  . Current medications and supplements . Functional ability and status . Nutritional status . Physical activity . Advanced directives . List of other physicians . Hospitalizations, surgeries, and ER visits in previous 12 months . Vitals . Screenings to include cognitive, depression, and falls . Referrals and appointments  In addition, I have reviewed and discussed with patient certain preventive protocols, quality metrics, and best practice recommendations. A written personalized care plan for preventive services as well as general preventive health recommendations were provided to patient.     UYQIH,KVQQV, RN  08/27/2017

## 2017-08-27 ENCOUNTER — Other Ambulatory Visit: Payer: Self-pay | Admitting: Adult Health

## 2017-08-27 ENCOUNTER — Ambulatory Visit (INDEPENDENT_AMBULATORY_CARE_PROVIDER_SITE_OTHER): Payer: Medicare Other

## 2017-08-27 VITALS — BP 96/56 | HR 67 | Ht 70.0 in | Wt 223.0 lb

## 2017-08-27 DIAGNOSIS — E038 Other specified hypothyroidism: Secondary | ICD-10-CM

## 2017-08-27 DIAGNOSIS — E1142 Type 2 diabetes mellitus with diabetic polyneuropathy: Secondary | ICD-10-CM

## 2017-08-27 DIAGNOSIS — Z0000 Encounter for general adult medical examination without abnormal findings: Secondary | ICD-10-CM

## 2017-08-27 DIAGNOSIS — Z23 Encounter for immunization: Secondary | ICD-10-CM

## 2017-08-27 DIAGNOSIS — I10 Essential (primary) hypertension: Secondary | ICD-10-CM

## 2017-08-27 DIAGNOSIS — Z760 Encounter for issue of repeat prescription: Secondary | ICD-10-CM

## 2017-08-27 DIAGNOSIS — Z1159 Encounter for screening for other viral diseases: Secondary | ICD-10-CM

## 2017-08-27 NOTE — Patient Instructions (Addendum)
Fernando Combs , Thank you for taking time to come for your Medicare Wellness Visit. I appreciate your ongoing commitment to your health goals. Please review the following plan we discussed and let me know if I can assist you in the future.   Will schedule eye exam this year / Dr. Gershon Crane   Educated regarding prediabetes and numbers;  A1c ranges from 5.8 to 6.5 or fasting Blood sugar > 115 -126; (126 is diabetic)   Risk: >72yo; family hx; overweight or obese; African American; Hispanic; Latino; American [place] American; [place]; history of diabetes when pregnant; or birth to a baby weighing over 9 lbs. Being less physically active than 30 minutes; 3 times a week;   Prevention; Losing a modest 7 to 8 lbs; If over 200 lbs; 10 to 14 lbs;  Choose healthier foods; colorful veggies; fish or lean meats; drinks water Reduce portion size Start exercising; 30 minutes of fast walking x 30 minutes per day/ 60 min for weight loss    Check with vascular regarding AAA check due to smoking hx   Veterans want to access their VA benefits can call [place] [facility]  [address] [place], [place] [address]  Phone: [phone] Or [phone] Fax: [phone]  OR   They can to to the [facility] office and call [phone] and If the prompt starts, hit 0 and then ask for eligibility. They will assist you to sign up for medical benefits.   Try to start walking more Take care of yourself     These are the goals we discussed: Goals    None      This is a list of the screening recommended for you and due dates:  Health Maintenance  Topic Date Due  .  Hepatitis C: One time screening is recommended by Center for Disease Control  (CDC) for  adults born from [birth year] through 1966.   [birth year]  . Eye exam for diabetics  07/13/1956  . Hemoglobin A1C  03/25/2017  . Flu Shot  06/24/2017  . Urine Protein Check  09/25/2017  . Complete foot exam   09/30/2017   . Colon Cancer Screening  10/29/2017  . Tetanus Vaccine  06/30/2024  . Pneumonia vaccines  Completed        Screening for Type 2 Diabetes A screening test for type 2 diabetes (type 2 diabetes mellitus) is a blood test to measure your blood sugar (glucose) level. This test is done to check for early signs of diabetes, before you develop symptoms. Type 2 diabetes is a long-term (chronic) disease that occurs when the pancreas does not make enough of a hormone called insulin. This results in high blood glucose levels, which can cause many complications. You may be screened for type 2 diabetes as part of your regular health care, especially if you have a high risk for diabetes. Screening can help identify type 2 diabetes at its early stage (prediabetes). Identifying and treating prediabetes may delay or prevent development of type 2 diabetes. What are the risk factors for type 2 diabetes? The following factors may make you more likely to develop type 2 diabetes:  Having a parent or sibling (first-degree relative) who has diabetes.  Being overweight or obese.  Being of American-Indian, [place], Hispanic, Latino, Asian, or African-American descent.  Not getting enough exercise.  Being older than 103.  Having a history of diabetes during pregnancy (gestational diabetes).  Having low levels of good cholesterol (HDL-C) or high  levels of blood fats (triglycerides).  Having high blood glucose in a previous blood test.  Having high blood pressure.  Having certain diseases or conditions, including: ? Acanthosis nigricans. This is a condition that causes dark skin on the neck, armpits, and groin. ? Polycystic ovary syndrome (PCOS). ? Heart disease.  Having delivered a baby who weighed more than 9 lb (4.1 kg).  Who should be screened for type 2 diabetes? Adults  Adults age 56 and older. These adults should be screened at least once every three years.  Adults who are younger  than 71, overweight, and have at least one other risk factor. These adults should be screened at least once every three years.  Adults who have normal blood glucose levels and two or more risk factors. These adults may be screened once every year (annually).  Women who have had gestational diabetes in the past. These women should be screened at least once every three years.  Pregnant women who have risk factors. These women should be screened at their first prenatal visit.  Pregnant women with no risk factors. These women should be screened between weeks 24 and 28 of pregnancy. Children and adolescents  Children and adolescents should be screened for type 2 diabetes if they are overweight and have 2 of the following risk factors: ? A family history of type 2 diabetes. ? Being a member of a high risk race or ethnic group. ? Signs of insulin resistance or conditions associated with insulin resistance. ? A mother who had gestational diabetes while pregnant with him or her.  Screening should be done at least once every three years, starting at age 58. Your health care provider or your child's health care provider may recommend having a screening more or less often. What happens during screening? During screening, your health care provider may ask questions about:  Your health and your risk factors, including your activity level and any medical conditions that you have.  The health of your first-degree relatives.  Past pregnancies, if this applies.  Your health care provider will also do a physical exam, including a blood pressure measurement and blood tests. There are four blood tests that can be used to screen for type 2 diabetes. You may have one or more of the following:  A fasting plasma glucose test (FBG). You will not be allowed to eat for at least eight hours before a blood sample is taken.  A random blood glucose test. This test checks your blood glucose at any time of the day  regardless of when you ate.  An oral glucose tolerance test (OGTT). This test measures your blood glucose at two times: ? After you have not eaten (have fasted) overnight. ? Two hours after you drink a glucose-containing beverage. A diagnosis can be made if the level is greater than 200 mg/dL.  An A1c test. This test provides information about blood glucose control over the previous three months.  What do the results mean? Your test results are a measurement of how much glucose is in your blood. Normal blood glucose levels mean that you do not have diabetes or prediabetes. High blood glucose levels may mean that you have prediabetes or diabetes. Depending on the results, other tests may be needed to confirm the diagnosis. This information is not intended to replace advice given to you by your health care provider. Make sure you discuss any questions you have with your health care provider. Document Released: 09/06/2009 Document Revised: 04/17/2016 Document Reviewed:  09/07/2015 Elsevier Interactive Patient Education  2017 [place] Prevention in the Home Falls can cause injuries. They can happen to people of all ages. There are many things you can do to make your home safe and to help prevent falls. What can I do on the outside of my home?  Regularly fix the edges of walkways and driveways and fix any cracks.  Remove anything that might make you trip as you walk through a door, such as a raised step or threshold.  Trim any bushes or trees on the path to your home.  Use bright outdoor lighting.  Clear any walking paths of anything that might make someone trip, such as rocks or tools.  Regularly check to see if handrails are loose or broken. Make sure that both sides of any steps have handrails.  Any raised decks and porches should have guardrails on the edges.  Have any leaves, snow, or ice cleared regularly.  Use sand or salt on walking paths during winter.  Clean up  any spills in your garage right away. This includes oil or grease spills. What can I do in the bathroom?  Use night lights.  Install grab bars by the toilet and in the tub and shower. Do not use towel bars as grab bars.  Use non-skid mats or decals in the tub or shower.  If you need to sit down in the shower, use a plastic, non-slip stool.  Keep the floor dry. Clean up any water that spills on the floor as soon as it happens.  Remove soap buildup in the tub or shower regularly.  Attach bath mats securely with double-sided non-slip rug tape.  Do not have throw rugs and other things on the floor that can make you trip. What can I do in the bedroom?  Use night lights.  Make sure that you have a light by your bed that is easy to reach.  Do not use any sheets or blankets that are too big for your bed. They should not hang down onto the floor.  Have a firm chair that has side arms. You can use this for support while you get dressed.  Do not have throw rugs and other things on the floor that can make you trip. What can I do in the kitchen?  Clean up any spills right away.  Avoid walking on wet floors.  Keep items that you use a lot in easy-to-reach places.  If you need to reach something above you, use a strong step stool that has a grab bar.  Keep electrical cords out of the way.  Do not use floor polish or wax that makes floors slippery. If you must use wax, use non-skid floor wax.  Do not have throw rugs and other things on the floor that can make you trip. What can I do with my stairs?  Do not leave any items on the stairs.  Make sure that there are handrails on both sides of the stairs and use them. Fix handrails that are broken or loose. Make sure that handrails are as long as the stairways.  Check any carpeting to make sure that it is firmly attached to the stairs. Fix any carpet that is loose or worn.  Avoid having throw rugs at the top or bottom of the stairs. If  you do have throw rugs, attach them to the floor with carpet tape.  Make sure that you have a light switch at the top  of the stairs and the bottom of the stairs. If you do not have them, ask someone to add them for you. What else can I do to help prevent falls?  Wear shoes that: ? Do not have high heels. ? Have rubber bottoms. ? Are comfortable and fit you well. ? Are closed at the toe. Do not wear sandals.  If you use a stepladder: ? Make sure that it is fully opened. Do not climb a closed stepladder. ? Make sure that both sides of the stepladder are locked into place. ? Ask someone to hold it for you, if possible.  Clearly mark and make sure that you can see: ? Any grab bars or handrails. ? First and last steps. ? Where the edge of each step is.  Use tools that help you move around (mobility aids) if they are needed. These include: ? Canes. ? Walkers. ? Scooters. ? Crutches.  Turn on the lights when you go into a dark area. Replace any light bulbs as soon as they burn out.  Set up your furniture so you have a clear path. Avoid moving your furniture around.  If any of your floors are uneven, fix them.  If there are any pets around you, be aware of where they are.  Review your medicines with your doctor. Some medicines can make you feel dizzy. This can increase your chance of falling. Ask your doctor what other things that you can do to help prevent falls. This information is not intended to replace advice given to you by your health care provider. Make sure you discuss any questions you have with your health care provider. Document Released: 09/06/2009 Document Revised: 04/17/2016 Document Reviewed: 12/15/2014 Elsevier Interactive Patient Education  2018 [place] Maintenance, Male A healthy lifestyle and preventive care is important for your health and wellness. Ask your health care provider about what schedule of regular examinations is right for you. What  should I know about weight and diet? Eat a Healthy Diet  Eat plenty of vegetables, fruits, whole grains, low-fat dairy products, and lean protein.  Do not eat a lot of foods high in solid fats, added sugars, or salt.  Maintain a Healthy Weight Regular exercise can help you achieve or maintain a healthy weight. You should:  Do at least 150 minutes of exercise each week. The exercise should increase your heart rate and make you sweat (moderate-intensity exercise).  Do strength-training exercises at least twice a week.  Watch Your Levels of Cholesterol and Blood Lipids  Have your blood tested for lipids and cholesterol every 5 years starting at 72 years of age. If you are at high risk for heart disease, you should start having your blood tested when you are 72 years old. You may need to have your cholesterol levels checked more often if: ? Your lipid or cholesterol levels are high. ? You are older than 72 years of age. ? You are at high risk for heart disease.  What should I know about cancer screening? Many types of cancers can be detected early and may often be prevented. Lung Cancer  You should be screened every year for lung cancer if: ? You are a current smoker who has smoked for at least 30 years. ? You are a former smoker who has quit within the past 15 years.  Talk to your health care provider about your screening options, when you should start screening, and how often you should be screened.  Colorectal Cancer  Routine colorectal cancer screening usually begins at 72 years of age and should be repeated every 5-10 years until you are 72 years old. You may need to be screened more often if early forms of precancerous polyps or small growths are found. Your health care provider may recommend screening at an earlier age if you have risk factors for colon cancer.  Your health care provider may recommend using home test kits to check for hidden blood in the stool.  A small camera  at the end of a tube can be used to examine your colon (sigmoidoscopy or colonoscopy). This checks for the earliest forms of colorectal cancer.  Prostate and Testicular Cancer  Depending on your age and overall health, your health care provider may do certain tests to screen for prostate and testicular cancer.  Talk to your health care provider about any symptoms or concerns you have about testicular or prostate cancer.  Skin Cancer  Check your skin from head to toe regularly.  Tell your health care provider about any new moles or changes in moles, especially if: ? There is a change in a mole's size, shape, or color. ? You have a mole that is larger than a pencil eraser.  Always use sunscreen. Apply sunscreen liberally and repeat throughout the day.  Protect yourself by wearing long sleeves, pants, a wide-brimmed hat, and sunglasses when outside.  What should I know about heart disease, diabetes, and high blood pressure?  If you are 39-25 years of age, have your blood pressure checked every 3-5 years. If you are 43 years of age or older, have your blood pressure checked every year. You should have your blood pressure measured twice-once when you are at a hospital or clinic, and once when you are not at a hospital or clinic. Record the average of the two measurements. To check your blood pressure when you are not at a hospital or clinic, you can use: ? An automated blood pressure machine at a pharmacy. ? A home blood pressure monitor.  Talk to your health care provider about your target blood pressure.  If you are between 50-27 years old, ask your health care provider if you should take aspirin to prevent heart disease.  Have regular diabetes screenings by checking your fasting blood sugar level. ? If you are at a normal weight and have a low risk for diabetes, have this test once every three years after the age of 99. ? If you are overweight and have a high risk for diabetes, consider  being tested at a younger age or more often.  A one-time screening for abdominal aortic aneurysm (AAA) by ultrasound is recommended for men aged 78-75 years who are current or former smokers. What should I know about preventing infection? Hepatitis B If you have a higher risk for hepatitis B, you should be screened for this virus. Talk with your health care provider to find out if you are at risk for hepatitis B infection. Hepatitis C Blood testing is recommended for:  Everyone born from [birth year] through 1966.  Anyone with known risk factors for hepatitis C.  Sexually Transmitted Diseases (STDs)  You should be screened each year for STDs including gonorrhea and chlamydia if: ? You are sexually active and are younger than 72 years of age. ? You are older than 73 years of age and your health care provider tells you that you are at risk for this type of infection. ? Your sexual activity has  changed since you were last screened and you are at an increased risk for chlamydia or gonorrhea. Ask your health care provider if you are at risk.  Talk with your health care provider about whether you are at high risk of being infected with HIV. Your health care provider may recommend a prescription medicine to help prevent HIV infection.  What else can I do?  Schedule regular health, dental, and eye exams.  Stay current with your vaccines (immunizations).  Do not use any tobacco products, such as cigarettes, chewing tobacco, and e-cigarettes. If you need help quitting, ask your health care provider.  Limit alcohol intake to no more than 2 drinks per day. One drink equals 12 ounces of beer, 5 ounces of wine, or 1 ounces of hard liquor.  Do not use street drugs.  Do not share needles.  Ask your health care provider for help if you need support or information about quitting drugs.  Tell your health care provider if you often feel depressed.  Tell your health care provider if you have ever been  abused or do not feel safe at home. This information is not intended to replace advice given to you by your health care provider. Make sure you discuss any questions you have with your health care provider. Document Released: 05/08/2008 Document Revised: 07/09/2016 Document Reviewed: 08/14/2015 Elsevier Interactive Patient Education  [employer].

## 2017-08-27 NOTE — Progress Notes (Signed)
I have reviewed and agree with this plan  

## 2017-08-28 ENCOUNTER — Other Ambulatory Visit: Payer: Self-pay

## 2017-08-28 MED ORDER — CLOPIDOGREL BISULFATE 75 MG PO TABS: 75.0000 mg | ORAL_TABLET | Freq: Every day | ORAL | 0 refills | Status: DC

## 2017-08-28 NOTE — Telephone Encounter (Signed)
Pt is coming in Monday for CPX

## 2017-08-31 ENCOUNTER — Ambulatory Visit: Payer: Medicare Other | Admitting: Adult Health

## 2017-08-31 NOTE — Telephone Encounter (Signed)
Sent to the pharmacy by e-scribe for 90 days.  Pt has appt on 10/01/17 for CPX/yearly

## 2017-09-01 ENCOUNTER — Other Ambulatory Visit: Payer: Self-pay

## 2017-09-01 MED ORDER — CLOPIDOGREL BISULFATE 75 MG PO TABS: 75.0000 mg | ORAL_TABLET | Freq: Every day | ORAL | 0 refills | Status: DC

## 2017-09-03 ENCOUNTER — Other Ambulatory Visit: Payer: Self-pay | Admitting: Adult Health

## 2017-09-03 DIAGNOSIS — N400 Enlarged prostate without lower urinary tract symptoms: Secondary | ICD-10-CM

## 2017-09-03 NOTE — Telephone Encounter (Signed)
Sent to the pharmacy by e-scribe for 90 days. Pt has cpx on 10/01/17

## 2017-10-01 ENCOUNTER — Ambulatory Visit (INDEPENDENT_AMBULATORY_CARE_PROVIDER_SITE_OTHER): Payer: Medicare Other | Admitting: Adult Health

## 2017-10-01 ENCOUNTER — Encounter: Payer: Self-pay | Admitting: Adult Health

## 2017-10-01 VITALS — BP 108/60 | HR 66 | Temp 97.7°F | Ht 70.0 in | Wt 228.0 lb

## 2017-10-01 DIAGNOSIS — Z125 Encounter for screening for malignant neoplasm of prostate: Secondary | ICD-10-CM

## 2017-10-01 DIAGNOSIS — I10 Essential (primary) hypertension: Secondary | ICD-10-CM | POA: Diagnosis not present

## 2017-10-01 DIAGNOSIS — Z0000 Encounter for general adult medical examination without abnormal findings: Secondary | ICD-10-CM | POA: Diagnosis not present

## 2017-10-01 DIAGNOSIS — F339 Major depressive disorder, recurrent, unspecified: Secondary | ICD-10-CM

## 2017-10-01 DIAGNOSIS — E038 Other specified hypothyroidism: Secondary | ICD-10-CM | POA: Diagnosis not present

## 2017-10-01 DIAGNOSIS — Z760 Encounter for issue of repeat prescription: Secondary | ICD-10-CM | POA: Diagnosis not present

## 2017-10-01 DIAGNOSIS — E782 Mixed hyperlipidemia: Secondary | ICD-10-CM

## 2017-10-01 DIAGNOSIS — Z1159 Encounter for screening for other viral diseases: Secondary | ICD-10-CM | POA: Diagnosis not present

## 2017-10-01 DIAGNOSIS — E118 Type 2 diabetes mellitus with unspecified complications: Secondary | ICD-10-CM | POA: Diagnosis not present

## 2017-10-01 LAB — CBC WITH DIFFERENTIAL/PLATELET
Basophils Absolute: 0.1 10*3/uL (ref 0.0–0.1)
Basophils Relative: 0.6 % (ref 0.0–3.0)
EOS ABS: 0.3 10*3/uL (ref 0.0–0.7)
Eosinophils Relative: 3.8 % (ref 0.0–5.0)
HCT: 41.6 % (ref 39.0–52.0)
Hemoglobin: 13.7 g/dL (ref 13.0–17.0)
LYMPHS ABS: 1.4 10*3/uL (ref 0.7–4.0)
LYMPHS PCT: 17.7 % (ref 12.0–46.0)
MCHC: 33 g/dL (ref 30.0–36.0)
MCV: 88.7 fl (ref 78.0–100.0)
MONOS PCT: 7.8 % (ref 3.0–12.0)
Monocytes Absolute: 0.6 10*3/uL (ref 0.1–1.0)
NEUTROS ABS: 5.7 10*3/uL (ref 1.4–7.7)
NEUTROS PCT: 70.1 % (ref 43.0–77.0)
PLATELETS: 260 10*3/uL (ref 150.0–400.0)
RBC: 4.69 Mil/uL (ref 4.22–5.81)
RDW: 15.9 % — ABNORMAL HIGH (ref 11.5–15.5)
WBC: 8.1 10*3/uL (ref 4.0–10.5)

## 2017-10-01 LAB — T3, FREE: T3, Free: 2.9 pg/mL (ref 2.3–4.2)

## 2017-10-01 LAB — TSH: TSH: 4.5 u[IU]/mL (ref 0.35–4.50)

## 2017-10-01 LAB — HEMOGLOBIN A1C: Hgb A1c MFr Bld: 6.7 % — ABNORMAL HIGH (ref 4.6–6.5)

## 2017-10-01 LAB — T4, FREE: Free T4: 0.81 ng/dL (ref 0.60–1.60)

## 2017-10-01 LAB — PSA: PSA: 0.23 ng/mL (ref 0.10–4.00)

## 2017-10-01 MED ORDER — AMLODIPINE BESYLATE 5 MG PO TABS: 5.0000 mg | ORAL_TABLET | Freq: Every day | ORAL | 3 refills | Status: DC

## 2017-10-01 NOTE — Patient Instructions (Signed)
It was great seeing you today!   I will follow up with you regarding your blood work   I will see you tomorrow at 11 am.   Please work on diet and exercise

## 2017-10-01 NOTE — Progress Notes (Signed)
Subjective:    Patient ID: Fernando Combs, male    DOB: 1946/06/02, 71 y.o.   MRN: [id number]  HPI  Patient presents for yearly preventative medicine examination. He is a pleasant 71 year old male who  has a past medical history of Benign prostatic hypertrophy, CAD (coronary artery disease), Carotid artery occlusion, Colitis, ischemic ([place]) (06/2005), Diabetes mellitus type II, Diabetes, polyneuropathy ([place]), Diabetic peripheral neuropathy ([place]), DVT (deep venous thrombosis) ([place]), Gout, History of myocardial infarction, History of syncope, History of tobacco abuse, Hyperlipidemia, Hypertension, Hypothyroidism, Myocardial infarction [facility]), Peripheral vascular disease ([place]), Shingles, and Thrombus (04/2008).   He takes synthroid 88 mcg daily for hypothyroidism   He takes Norvasc 10 mg and Coreg 25 mg . He reports that his blood pressures have been running low, he often has blood pressure readings at home in the 29-562 systolic. He will also often have dizziness with standing. Denies any LOC or syncope   He takes lipitor for hyperlipidemia   Due to history of CAD with multiple stent placements he is on chronic anti-coagulation with Plavix. He is followed by Vascular for PVD   His diabetes has been controlled with diet in the past. Unfortunately, he has not returned since last year to have his A1c checked. At home his blood sugars have been running in the 140's consistently. He has not been eating a diabetic diet nor exercising on a regular basis  Lab Results  Component Value Date   HGBA1C 7.0 (H) 09/25/2016   He takes Flomax for BPH with nocturia   He also takes Gabapentin for neuropathic pain and has had good results with this   He takes Zoloft 25 mg for depression. Feels as though this is stable.   All immunizations and health maintenance protocols were reviewed with the patient and needed orders were placed. He is up to date on vaccinations  Appropriate screening laboratory values were  ordered for the patient including screening of hyperlipidemia, renal function and hepatic function. If indicated by BPH, a PSA was ordered.  Medication reconciliation,  past medical history, social history, problem list and allergies were reviewed in detail with the patient  Goals were established with regard to weight loss, exercise, and  diet in compliance with medications  End of life planning was discussed. He has an advanced directive and living will.   His last colonoscopy was in Dec 2017. There were found to be many polyps and it was advised that he have a repeat colonoscopy in 6 months. He has not followed up on this   He has no acute complaints and states " I am feeling really good."   Review of Systems  Constitutional: Negative.   HENT: Negative.   Eyes: Negative.   Respiratory: Negative.   Cardiovascular: Negative.   Gastrointestinal: Negative.   Endocrine: Negative.   Genitourinary: Negative.   Musculoskeletal: Negative.   Skin: Negative.   Allergic/Immunologic: Negative.   Neurological: Negative.   Hematological: Negative.   Psychiatric/Behavioral: Negative.   All other systems reviewed and are negative.  Past Medical History:  Diagnosis Date  . Benign prostatic hypertrophy   . CAD (coronary artery disease)    cath 05/16/2015 patent LIMA to LAD, SVG to OM, SVG to RPDA, SVG to diagonal was occluded. PTCA 95% prox LAD reduce down to 60%. Medical management  . Carotid artery occlusion   . Colitis, ischemic ([place]) 06/2005    history of  . Diabetes mellitus type II   . Diabetes, polyneuropathy ([place])   .  Diabetic peripheral neuropathy ([place])   . DVT (deep venous thrombosis) ([place])   . Gout   . History of myocardial infarction   . History of syncope    most likely vasovagal  . History of tobacco abuse   . Hyperlipidemia   . Hypertension   . Hypothyroidism   . Myocardial infarction ([place])   . Peripheral vascular disease ([place])   . Shingles   . Thrombus 04/2008    acute thrombus of his right superficial artery with claudication    Social History   Socioeconomic History  . Marital status: Married    Spouse name: Not on file  . Number of children: Not on file  . Years of education: Not on file  . Highest education level: Not on file  Social Needs  . Financial resource strain: Not on file  . Food insecurity - worry: Not on file  . Food insecurity - inability: Not on file  . Transportation needs - medical: Not on file  . Transportation needs - non-medical: Not on file  Occupational History  . Not on file  Tobacco Use  . Smoking status: Former Smoker    Years: 15.00    Types: Cigarettes    Last attempt to quit: 04/14/2011    Years since quitting: 6.4  . Smokeless tobacco: Never Used  Substance and Sexual Activity  . Alcohol use: No  . Drug use: No  . Sexual activity: Not on file  Other Topics Concern  . Not on file  Social History Narrative   Occupation:  Retired from [employer]    Married - lives with wife (2nd marriage)   3 children     Alcohol use-no   Tobacco Use - Quit smoking 3 years ago           Past Surgical History:  Procedure Laterality Date  . CATARACT EXTRACTION Bilateral   . CORONARY ARTERY BYPASS GRAFT  08/04/00   times 4 (left interanl mammary artery to the left anterior descending, saphenous vein,graft to diagonal, saphenous vein graft to obtuse marginal, spahenous vein graft to posterior descending-Surgeon  Tharon Aquas Tright III  . LOWER EXTREMITY ANGIOGRAM  March 09, 2012  . OTHER SURGICAL HISTORY  11/2010   angiogram, right leg stent placement-  -Dr Kellie Simmering  . peripheral vascular surgery    . PR VEIN BYPASS GRAFT,AORTO-FEM-POP  08/04/00    Family History  Problem Relation Age of Onset  . Deep vein thrombosis Mother        Varicose Veins  . Heart disease Mother   . Hyperlipidemia Mother   . Hypertension Mother   . Peripheral vascular disease Mother   . Lung cancer Mother   . Stroke Father   .  Deep vein thrombosis Father   . Diabetes Father   . Heart disease Father        Heart Disease  before age 67  . Hyperlipidemia Father   . Hypertension Father   . Heart attack Father   . Peripheral vascular disease Father   . Alcohol abuse Unknown   . Stroke Unknown   . Coronary artery disease Unknown   . Hyperlipidemia Unknown   . Deep vein thrombosis Brother   . Diabetes Brother   . Heart disease Brother        Heart Disease before age 75  . Hyperlipidemia Brother   . Hypertension Brother   . Heart attack Brother   . Peripheral vascular disease Brother   . Lung  cancer Sister   . Kidney cancer Brother        on hospice   . Other Neg Hx        denies family history of blood clots or blood disorder    No Known Allergies  Current Outpatient Medications on File Prior to Visit  Medication Sig Dispense Refill  . allopurinol (ZYLOPRIM) 100 MG tablet TAKE 2 TABLETS BY MOUTH  DAILY 180 tablet 0  . amLODipine (NORVASC) 10 MG tablet TAKE 1 TABLET BY MOUTH  DAILY 90 tablet 0  . aspirin 81 MG chewable tablet Chew 1 tablet (81 mg total) by mouth daily.    Marland Kitchen atorvastatin (LIPITOR) 80 MG tablet TAKE 1 TABLET BY MOUTH  DAILY AT 6 P.M. 90 tablet 2  . carvedilol (COREG) 25 MG tablet Take 1 tablet by mouth two  times daily with a meal 180 tablet 3  . clopidogrel (PLAVIX) 75 MG tablet Take 1 tablet (75 mg total) by mouth daily. 15 tablet 0  . gabapentin (NEURONTIN) 100 MG capsule TAKE 1 CAPSULE BY MOUTH 3  TIMES DAILY 270 capsule 0  . isosorbide mononitrate (IMDUR) 60 MG 24 hr tablet TAKE 1 TABLET BY MOUTH  DAILY 90 tablet 2  . levothyroxine (SYNTHROID, LEVOTHROID) 88 MCG tablet TAKE 1 TABLET BY MOUTH  DAILY 90 tablet 0  . nitroGLYCERIN (NITROSTAT) 0.4 MG SL tablet DISSOLVE 1 TABLET UNDER THE TONGUE EVERY 5 MINUTES AS  NEEDED FOR CHEST PAIN FOR  UP TO 3 DOSES. SEEK MEDICAL ATTENTION IF NO RELIEF. 90 tablet 3  . sertraline (ZOLOFT) 25 MG tablet TAKE 1 TABLET BY MOUTH  DAILY 90 tablet 2  .  tamsulosin (FLOMAX) 0.4 MG CAPS capsule TAKE 1 CAPSULE BY MOUTH  DAILY 90 capsule 0   Current Facility-Administered Medications on File Prior to Visit  Medication Dose Route Frequency Provider Last Rate Last Dose  . 0.9 %  sodium chloride infusion  500 mL Intravenous Continuous Nandigam, Kavitha V, MD        BP 108/60 (BP Location: Left Arm, Patient Position: Sitting, Cuff Size: Normal)   Pulse 66   Temp 97.7 F (36.5 C) (Oral)   Ht 5\' 10"  (1.778 m)   Wt 228 lb (103.4 kg)   SpO2 96%   BMI 32.71 kg/m       Objective:   Physical Exam  Constitutional: He is oriented to person, place, and time. He appears well-developed and well-nourished. No distress.  Obese    HENT:  Head: Normocephalic and atraumatic.  Right Ear: External ear normal.  Left Ear: External ear normal.  Nose: Nose normal.  Mouth/Throat: Oropharynx is clear and moist. No oropharyngeal exudate.  Eyes: Conjunctivae and EOM are normal. Pupils are equal, round, and reactive to light. Right eye exhibits no discharge. Left eye exhibits no discharge. No scleral icterus.  Neck: Normal range of motion. Neck supple. No JVD present. No tracheal deviation present. No thyromegaly present.  Cardiovascular: Normal rate, regular rhythm, normal heart sounds and intact distal pulses. Exam reveals no gallop and no friction rub.  No murmur heard. Pulmonary/Chest: Effort normal and breath sounds normal. No stridor. No respiratory distress. He has no wheezes. He has no rales. He exhibits no tenderness.  Abdominal: Soft. Bowel sounds are normal. He exhibits no distension and no mass. There is no tenderness. There is no rebound and no guarding.  Genitourinary:  Genitourinary Comments: Deferred: Will do PSA  Musculoskeletal: Normal range of motion. He exhibits no edema, tenderness or  deformity.  Lymphadenopathy:    He has no cervical adenopathy.  Neurological: He is alert and oriented to person, place, and time. He has normal reflexes. He  displays normal reflexes. No cranial nerve deficit. He exhibits normal muscle tone. Coordination normal.  Skin: Skin is warm and dry. No rash noted. He is not diaphoretic. No erythema. No pallor.  Scattered skin tags throughout body. No concerning moles  Surgical scar on abdomen   Psychiatric: He has a normal mood and affect. His behavior is normal. Judgment and thought content normal.  Nursing note and vitals reviewed.     Assessment & Plan:  1. Routine general medical examination at a health care facility - Need to lose weight  - Encouraged diabetic diet and frequent aerobic exercise  - Basic metabolic panel - CBC with Differential/Platelet - Hemoglobin A1c - Hepatic function panel - Lipid panel - TSH - PSA - T3, Free - T4, Free  2. Essential hypertension - I am going to decrease Norvasc from 10 mg to 5 mg due to symptomatic hypotension  - Basic metabolic panel - CBC with Differential/Platelet - Hemoglobin A1c - Hepatic function panel - Lipid panel - TSH - T3, Free - T4, Free - amLODipine (NORVASC) 5 MG tablet; Take 1 tablet (5 mg total) daily by mouth.  Dispense: 90 tablet; Refill: 3  3. Type 2 diabetes mellitus with complication, without long-term current use of insulin (HCC) - Consider adding Metformin  - Follow up in 3 months  -- Basic metabolic panel - CBC with Differential/Platelet - Hemoglobin A1c - Hepatic function panel - Lipid panel - TSH - T3, Free - T4, Free  4. Other specified hypothyroidism - Consider increase in synthroid  - Basic metabolic panel - CBC with Differential/Platelet - Hemoglobin A1c - Hepatic function panel - Lipid panel - TSH - T3, Free - T4, Free  5. Depression, recurrent ([place]) - Well controlled on Zoloft   6. Mixed hyperlipidemia - Currently taking Lipitor 80 mg.  - educated on the importance of a diabetic diet and frequent exercise  - Basic metabolic panel - CBC with Differential/Platelet - Hemoglobin A1c - Hepatic  function panel - Lipid panel - TSH - T3, Free - T4, Free  7. Encounter for hepatitis C screening test for low risk patient  - Hepatitis C antibody  8. Medication refill  - amLODipine (NORVASC) 5 MG tablet; Take 1 tablet (5 mg total) daily by mouth.  Dispense: 90 tablet; Refill: 3  Dorothyann Peng, NP

## 2017-10-02 ENCOUNTER — Encounter: Payer: Self-pay | Admitting: Adult Health

## 2017-10-02 ENCOUNTER — Ambulatory Visit (INDEPENDENT_AMBULATORY_CARE_PROVIDER_SITE_OTHER): Payer: Medicare Other | Admitting: Adult Health

## 2017-10-02 VITALS — BP 110/64 | Temp 98.1°F | Wt 228.0 lb

## 2017-10-02 DIAGNOSIS — D492 Neoplasm of unspecified behavior of bone, soft tissue, and skin: Secondary | ICD-10-CM

## 2017-10-02 DIAGNOSIS — L570 Actinic keratosis: Secondary | ICD-10-CM

## 2017-10-02 LAB — LIPID PANEL
CHOL/HDL RATIO: 3
Cholesterol: 106 mg/dL (ref 0–200)
HDL: 37.9 mg/dL — ABNORMAL LOW (ref >39.00)
LDL CALC: 34 mg/dL (ref 0–99)
NonHDL: 67.75
TRIGLYCERIDES: 167 mg/dL — AB (ref 0.0–149.0)
VLDL: 33.4 mg/dL (ref 0.0–40.0)

## 2017-10-02 LAB — BASIC METABOLIC PANEL
BUN: 31 mg/dL — AB (ref 6–23)
CALCIUM: 9.5 mg/dL (ref 8.4–10.5)
CO2: 25 meq/L (ref 19–32)
CREATININE: 1.65 mg/dL — AB (ref 0.40–1.50)
Chloride: 100 mEq/L (ref 96–112)
GFR: 43.77 mL/min — ABNORMAL LOW (ref >60.00)
Glucose, Bld: 156 mg/dL — ABNORMAL HIGH (ref 70–99)
Potassium: 4.6 mEq/L (ref 3.5–5.1)
Sodium: 136 mEq/L (ref 135–145)

## 2017-10-02 LAB — HEPATIC FUNCTION PANEL
ALT: 16 U/L (ref 0–53)
AST: 14 U/L (ref 0–37)
Albumin: 4.2 g/dL (ref 3.5–5.2)
Alkaline Phosphatase: 88 U/L (ref 39–117)
BILIRUBIN DIRECT: 0.1 mg/dL (ref 0.0–0.3)
BILIRUBIN TOTAL: 0.4 mg/dL (ref 0.2–1.2)
Total Protein: 7.3 g/dL (ref 6.0–8.3)

## 2017-10-02 LAB — HEPATITIS C ANTIBODY
Hepatitis C Ab: NONREACTIVE
SIGNAL TO CUT-OFF: 0.08 (ref <1.00)

## 2017-10-02 NOTE — Progress Notes (Signed)
Subjective:    Patient ID: Fernando Combs, male    DOB: 1946/06/06, 71 y.o.   MRN: [id number]  HPI  71 year old male who  has a past medical history of Benign prostatic hypertrophy, CAD (coronary artery disease), Carotid artery occlusion, Colitis, ischemic ([place]) (06/2005), Diabetes mellitus type II, Diabetes, polyneuropathy ([place]), Diabetic peripheral neuropathy ([place]), DVT (deep venous thrombosis) ([place]), Gout, History of myocardial infarction, History of syncope, History of tobacco abuse, Hyperlipidemia, Hypertension, Hypothyroidism, Myocardial infarction [facility]), Peripheral vascular disease ([place]), Shingles, and Thrombus (04/2008).  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 He has had this growth for multiple years at this point. He would like it removed as he feels as though it has been growing in size and it will often bleed when he nicks it with his razor. He denies any pain from site.   Review of Systems See HPI   Past Medical History:  Diagnosis Date  . Benign prostatic hypertrophy   . CAD (coronary artery disease)    cath 05/16/2015 patent LIMA to LAD, SVG to OM, SVG to RPDA, SVG to diagonal was occluded. PTCA 95% prox LAD reduce down to 60%. Medical management  . Carotid artery occlusion   . Colitis, ischemic ([place]) 06/2005    history of  . Diabetes mellitus type II   . Diabetes, polyneuropathy ([place])   . Diabetic peripheral neuropathy ([place])   . DVT (deep venous thrombosis) ([place])   . Gout   . History of myocardial infarction   . History of syncope    most likely vasovagal  . History of tobacco abuse   . Hyperlipidemia   . Hypertension   . Hypothyroidism   . Myocardial infarction ([place])   . Peripheral vascular disease ([place])   . Shingles   . Thrombus 04/2008   acute thrombus of his right superficial artery with claudication    Social History   Socioeconomic History  . Marital status: Married    Spouse name:  Not on file  . Number of children: Not on file  . Years of education: Not on file  . Highest education level: Not on file  Social Needs  . Financial resource strain: Not on file  . Food insecurity - worry: Not on file  . Food insecurity - inability: Not on file  . Transportation needs - medical: Not on file  . Transportation needs - non-medical: Not on file  Occupational History  . Not on file  Tobacco Use  . Smoking status: Former Smoker    Years: 15.00    Types: Cigarettes    Last attempt to quit: 04/14/2011    Years since quitting: 6.4  . Smokeless tobacco: Never Used  Substance and Sexual Activity  . Alcohol use: No  . Drug use: No  . Sexual activity: Not on file  Other Topics Concern  . Not on file  Social History Narrative   Occupation:  Retired from [employer]    Married - lives with wife (2nd marriage)   3 children     Alcohol use-no   Tobacco Use - Quit smoking 3 years ago           Past Surgical History:  Procedure Laterality Date  . CATARACT EXTRACTION Bilateral   . CORONARY ARTERY BYPASS GRAFT  08/04/00   times 4 (left interanl mammary artery to the left anterior descending, saphenous vein,graft to diagonal, saphenous  vein graft to obtuse marginal, spahenous vein graft to posterior descending-Surgeon  Tharon Aquas Tright III  . LOWER EXTREMITY ANGIOGRAM  March 09, 2012  . OTHER SURGICAL HISTORY  11/2010   angiogram, right leg stent placement-  -Dr Kellie Simmering  . peripheral vascular surgery    . PR VEIN BYPASS GRAFT,AORTO-FEM-POP  08/04/00    Family History  Problem Relation Age of Onset  . Deep vein thrombosis Mother        Varicose Veins  . Heart disease Mother   . Hyperlipidemia Mother   . Hypertension Mother   . Peripheral vascular disease Mother   . Lung cancer Mother   . Stroke Father   . Deep vein thrombosis Father   . Diabetes Father   . Heart disease Father        Heart Disease  before age 26  . Hyperlipidemia Father   . Hypertension  Father   . Heart attack Father   . Peripheral vascular disease Father   . Alcohol abuse Unknown   . Stroke Unknown   . Coronary artery disease Unknown   . Hyperlipidemia Unknown   . Deep vein thrombosis Brother   . Diabetes Brother   . Heart disease Brother        Heart Disease before age 4  . Hyperlipidemia Brother   . Hypertension Brother   . Heart attack Brother   . Peripheral vascular disease Brother   . Lung cancer Sister   . Kidney cancer Brother        on hospice   . Other Neg Hx        denies family history of blood clots or blood disorder    No Known Allergies  Current Outpatient Medications on File Prior to Visit  Medication Sig Dispense Refill  . allopurinol (ZYLOPRIM) 100 MG tablet TAKE 2 TABLETS BY MOUTH  DAILY 180 tablet 0  . amLODipine (NORVASC) 5 MG tablet Take 1 tablet (5 mg total) daily by mouth. 90 tablet 3  . aspirin 81 MG chewable tablet Chew 1 tablet (81 mg total) by mouth daily.    Marland Kitchen atorvastatin (LIPITOR) 80 MG tablet TAKE 1 TABLET BY MOUTH  DAILY AT 6 P.M. 90 tablet 2  . carvedilol (COREG) 25 MG tablet Take 1 tablet by mouth two  times daily with a meal 180 tablet 3  . clopidogrel (PLAVIX) 75 MG tablet Take 1 tablet (75 mg total) by mouth daily. 15 tablet 0  . gabapentin (NEURONTIN) 100 MG capsule TAKE 1 CAPSULE BY MOUTH 3  TIMES DAILY 270 capsule 0  . isosorbide mononitrate (IMDUR) 60 MG 24 hr tablet TAKE 1 TABLET BY MOUTH  DAILY 90 tablet 2  . levothyroxine (SYNTHROID, LEVOTHROID) 88 MCG tablet TAKE 1 TABLET BY MOUTH  DAILY 90 tablet 0  . nitroGLYCERIN (NITROSTAT) 0.4 MG SL tablet DISSOLVE 1 TABLET UNDER THE TONGUE EVERY 5 MINUTES AS  NEEDED FOR CHEST PAIN FOR  UP TO 3 DOSES. SEEK MEDICAL ATTENTION IF NO RELIEF. 90 tablet 3  . sertraline (ZOLOFT) 25 MG tablet TAKE 1 TABLET BY MOUTH  DAILY 90 tablet 2  . tamsulosin (FLOMAX) 0.4 MG CAPS capsule TAKE 1 CAPSULE BY MOUTH  DAILY 90 capsule 0   Current Facility-Administered Medications on File Prior to  Visit  Medication Dose Route Frequency Provider Last Rate Last Dose  . 0.9 %  sodium chloride infusion  500 mL Intravenous Continuous Nandigam, Venia Minks, MD        There were  no vitals taken for this visit.      Objective:   Physical Exam  Constitutional: He appears well-developed and well-nourished. No distress.  Skin: Skin is warm and dry. He is not diaphoretic.  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 His behavior is normal. Judgment and thought content normal.  Nursing note and vitals reviewed.     Assessment & Plan:  1. Skin neoplasm Procedure including risks/benefits explained to patient.  Questions were answered. After informed consent was obtained and a time out completed, site was cleansed with betadine and then alcohol. 1% Lidocaine with epinephrine was injected under lesion and then shave biopsy was performed. Area was cauterized to obtain hemostasis.  Pt tolerated procedure well.  Specimen sent for pathology review.  Pt instructed to keep the area dry for 24 hours and to contact us if he develops redness, drainage or swelling at the site.  Pt may use tylenol as needed for discomfort today.   - Dermatology pathology  Dorothyann Peng, NP

## 2017-10-06 NOTE — Progress Notes (Deleted)
Hi Sharyn Lull, that was planned on the 11/8 visit

## 2017-10-22 ENCOUNTER — Other Ambulatory Visit: Payer: Self-pay | Admitting: Adult Health

## 2017-10-22 DIAGNOSIS — N400 Enlarged prostate without lower urinary tract symptoms: Secondary | ICD-10-CM

## 2017-10-22 NOTE — Telephone Encounter (Signed)
Sent to the pharmacy by e-scribe. 

## 2017-10-28 ENCOUNTER — Other Ambulatory Visit: Payer: Self-pay | Admitting: Family Medicine

## 2017-10-28 DIAGNOSIS — Z760 Encounter for issue of repeat prescription: Secondary | ICD-10-CM

## 2017-10-28 DIAGNOSIS — I10 Essential (primary) hypertension: Secondary | ICD-10-CM

## 2017-10-28 MED ORDER — CARVEDILOL 25 MG PO TABS: ORAL_TABLET | ORAL | 3 refills | Status: DC

## 2017-10-28 NOTE — Telephone Encounter (Signed)
SENT TO THE PHARMACY BY E-SCRIBE. 

## 2017-11-04 ENCOUNTER — Other Ambulatory Visit: Payer: Self-pay | Admitting: Cardiovascular Disease

## 2017-11-26 ENCOUNTER — Encounter: Payer: Self-pay | Admitting: Physician Assistant

## 2017-11-26 ENCOUNTER — Ambulatory Visit: Payer: Medicare Other | Admitting: Physician Assistant

## 2017-11-26 VITALS — BP 118/72 | HR 75 | Ht 70.0 in | Wt 233.0 lb

## 2017-11-26 DIAGNOSIS — E119 Type 2 diabetes mellitus without complications: Secondary | ICD-10-CM | POA: Diagnosis not present

## 2017-11-26 DIAGNOSIS — I6523 Occlusion and stenosis of bilateral carotid arteries: Secondary | ICD-10-CM

## 2017-11-26 DIAGNOSIS — I25810 Atherosclerosis of coronary artery bypass graft(s) without angina pectoris: Secondary | ICD-10-CM

## 2017-11-26 DIAGNOSIS — I10 Essential (primary) hypertension: Secondary | ICD-10-CM

## 2017-11-26 DIAGNOSIS — E785 Hyperlipidemia, unspecified: Secondary | ICD-10-CM

## 2017-11-26 NOTE — Progress Notes (Signed)
Cardiology Office Note    Date:  11/28/2017   ID:  Fernando Combs, DOB 08/13/1946, MRN [id number]  PCP:  Dorothyann Peng, NP  Cardiologist:  Dr. Stanford Breed  Chief Complaint  Patient presents with  . Follow-up    needs med refills, no concerns     History of Present Illness:  Fernando Combs is a 72 y.o. male with PMH of HTN, HLD, DM II, CAD s/p CABG 2001 (LIMA to LAD, SVG to diag, SVG to OM, SVG to RPDA), carotid stenosis (CTA 09/2009 with 75% R and 76% L) and CKD.  Patient was initially admitted in June 2016 with lateral STEMI.  Left heart cath showed patent LIMA to LAD, SVG to OM, SVG to RPDA, however SVG to diagonal was occluded with severe native D1 disease.  He was noted to have 95% proximal LAD stenosis after proximal septal perforator and prior to the takeoff of D1 branch.  Diagonal branch was occluded at the proximal third of the vessel.  LAD was occluded shortly after the diagonal takeoff as well.  There was subtotal occlusion of the AV groove left circumflex and a total occlusion of native mid RCA after anterior RV marginal branch.  PTCA was performed at the very proximal LAD prior to the diagonal and diagonal perforation with 95% stenosis reduced down to 60%.  Unfortunately there was unsuccessful attempt at wiring the diagonal vessel due to acute angle takeoff which was occluded at the proximal third segment and that has been supplied by vein graft.  Medical therapy was recommended.  Post PCI, he continued to have chest discomfort.  He was placed on oral nitrates with resolution of the symptoms.  EF on LV gram was 35-40%.  Follow-up echo demonstrated EF 50-55% with possible posterior lateral hypokinesis and mild MR.  He was last seen by Dr. Stanford Breed on 07/27/2015, he was doing well at the time.  Unfortunately he has not been followed up since.  He has been followed by Dr. Kellie Simmering and Dr. Vallarie Mare for vascular needs.  He has a history of right SFA atherectomy and angioplasty in April 2013.  Last  ABI and carotid ultrasound in January of 2018 has been stable.  Patient presents today for cardiology office visit.  He denies any chest discomfort, shortness of breath, lower extremity edema, orthopnea or PND.  He is also scheduled to see his vascular doctor this month as well after a repeat image of his carotid and ABI.  Otherwise he has been doing very well on the current medication.  Last lipid panel obtained in November 2018 showed very well controlled LDL, his triglyceride was borderline high, and I recommended some over-the-counter fish oil.   Past Medical History:  Diagnosis Date  . Benign prostatic hypertrophy   . CAD (coronary artery disease)    cath 05/16/2015 patent LIMA to LAD, SVG to OM, SVG to RPDA, SVG to diagonal was occluded. PTCA 95% prox LAD reduce down to 60%. Medical management  . Carotid artery occlusion   . Colitis, ischemic ([place]) 06/2005    history of  . Diabetes mellitus type II   . Diabetes, polyneuropathy ([place])   . Diabetic peripheral neuropathy ([place])   . DVT (deep venous thrombosis) ([place])   . Gout   . History of myocardial infarction   . History of syncope    most likely vasovagal  . History of tobacco abuse   . Hyperlipidemia   . Hypertension   . Hypothyroidism   . Myocardial  infarction ([place])   . Peripheral vascular disease ([place])   . Shingles   . Thrombus 04/2008   acute thrombus of his right superficial artery with claudication    Past Surgical History:  Procedure Laterality Date  . CARDIAC CATHETERIZATION N/A 05/15/2015   Procedure: Left Heart Cath and Cors/Grafts Angiography;  Surgeon: Troy Sine, MD;  Location: [facility];  Service: Cardiovascular;  Laterality: N/A;  . CATARACT EXTRACTION Bilateral   . CORONARY ARTERY BYPASS GRAFT  08/04/00   times 4 (left interanl mammary artery to the left anterior descending, saphenous vein,graft to diagonal, saphenous vein graft to obtuse marginal, spahenous vein graft to posterior descending-Surgeon   Tharon Aquas Tright III  . LOWER EXTREMITY ANGIOGRAM  March 09, 2012  . LOWER EXTREMITY ANGIOGRAM Right 03/09/2012   Procedure: LOWER EXTREMITY ANGIOGRAM;  Surgeon: Serafina Mitchell, MD;  Location: [facility];  Service: Cardiovascular;  Laterality: Right;  . OTHER SURGICAL HISTORY  11/2010   angiogram, right leg stent placement-  -Dr Kellie Simmering  . peripheral vascular surgery    . PR VEIN BYPASS GRAFT,AORTO-FEM-POP  08/04/00    Current Medications: Outpatient Medications Prior to Visit  Medication Sig Dispense Refill  . allopurinol (ZYLOPRIM) 100 MG tablet TAKE 2 TABLETS BY MOUTH  DAILY 180 tablet 0  . aspirin 81 MG chewable tablet Chew 1 tablet (81 mg total) by mouth daily.    Marland Kitchen atorvastatin (LIPITOR) 80 MG tablet TAKE 1 TABLET BY MOUTH  DAILY AT 6 P.M. 90 tablet 2  . carvedilol (COREG) 25 MG tablet Take 1 tablet by mouth two  times daily with a meal 180 tablet 3  . clopidogrel (PLAVIX) 75 MG tablet TAKE 1 TABLET BY MOUTH  DAILY 15 tablet 0  . gabapentin (NEURONTIN) 100 MG capsule TAKE 1 CAPSULE BY MOUTH 3  TIMES DAILY 270 capsule 0  . isosorbide mononitrate (IMDUR) 60 MG 24 hr tablet TAKE 1 TABLET BY MOUTH  DAILY 90 tablet 2  . levothyroxine (SYNTHROID, LEVOTHROID) 88 MCG tablet TAKE 1 TABLET BY MOUTH  DAILY 90 tablet 0  . nitroGLYCERIN (NITROSTAT) 0.4 MG SL tablet DISSOLVE 1 TABLET UNDER THE TONGUE EVERY 5 MINUTES AS  NEEDED FOR CHEST PAIN FOR  UP TO 3 DOSES. SEEK MEDICAL ATTENTION IF NO RELIEF. 90 tablet 3  . sertraline (ZOLOFT) 25 MG tablet TAKE 1 TABLET BY MOUTH  DAILY 90 tablet 2  . tamsulosin (FLOMAX) 0.4 MG CAPS capsule TAKE 1 CAPSULE BY MOUTH  DAILY 90 capsule 2  . amLODipine (NORVASC) 5 MG tablet Take 1 tablet (5 mg total) daily by mouth. 90 tablet 3   Facility-Administered Medications Prior to Visit  Medication Dose Route Frequency Provider Last Rate Last Dose  . 0.9 %  sodium chloride infusion  500 mL Intravenous Continuous Nandigam, Venia Minks, MD         Allergies:   Patient has no  known allergies.   Social History   Socioeconomic History  . Marital status: Married    Spouse name: None  . Number of children: None  . Years of education: None  . Highest education level: None  Social Needs  . Financial resource strain: None  . Food insecurity - worry: None  . Food insecurity - inability: None  . Transportation needs - medical: None  . Transportation needs - non-medical: None  Occupational History  . None  Tobacco Use  . Smoking status: Former Smoker    Years: 15.00    Types: Cigarettes  Last attempt to quit: 04/14/2011    Years since quitting: 6.6  . Smokeless tobacco: Never Used  Substance and Sexual Activity  . Alcohol use: No  . Drug use: No  . Sexual activity: None  Other Topics Concern  . None  Social History Narrative   Occupation:  Retired from [employer]    Married - lives with wife (2nd marriage)   3 children     Alcohol use-no   Tobacco Use - Quit smoking 3 years ago            Family History:  The patient's family history includes Alcohol abuse in his unknown relative; Coronary artery disease in his unknown relative; Deep vein thrombosis in his brother, father, and mother; Diabetes in his brother and father; Heart attack in his brother and father; Heart disease in his brother, father, and mother; Hyperlipidemia in his brother, father, mother, and unknown relative; Hypertension in his brother, father, and mother; Kidney cancer in his brother; Lung cancer in his mother and sister; Peripheral vascular disease in his brother, father, and mother; Stroke in his father and unknown relative.   ROS:   Please see the history of present illness.    ROS All other systems reviewed and are negative.   PHYSICAL EXAM:   VS:  BP 118/72   Pulse 75   Ht 5\' 10"  (1.778 m)   Wt 233 lb (105.7 kg)   BMI 33.43 kg/m    GEN: Well nourished, well developed, in no acute distress  HEENT: normal  Neck: no JVD, carotid bruits, or masses Cardiac:  RRR; no murmurs, rubs, or gallops,no edema  Respiratory:  clear to auscultation bilaterally, normal work of breathing GI: soft, nontender, nondistended, + BS MS: no deformity or atrophy  Skin: warm and dry, no rash Neuro:  Alert and Oriented x 3, Strength and sensation are intact Psych: euthymic mood, full affect  Wt Readings from Last 3 Encounters:  11/26/17 233 lb (105.7 kg)  10/02/17 228 lb (103.4 kg)  10/01/17 228 lb (103.4 kg)      Studies/Labs Reviewed:   EKG:  EKG is ordered today.  The ekg ordered today demonstrates normal sinus rhythm, chronic T wave inversions in lead I and aVL.  This has not changed compared to 2016 EKG.  Recent Labs: 10/01/2017: ALT 16; BUN 31; Creatinine, Ser 1.65; Hemoglobin 13.7; Platelets 260.0; Potassium 4.6; Sodium 136; TSH 4.50   Lipid Panel    Component Value Date/Time   CHOL 106 10/01/2017 1018   TRIG 167.0 (H) 10/01/2017 1018   HDL 37.90 (L) 10/01/2017 1018   CHOLHDL 3 10/01/2017 1018   VLDL 33.4 10/01/2017 1018   LDLCALC 34 10/01/2017 1018    Additional studies/ records that were reviewed today include:   Echo 05/16/2015 LV EF: 50% -   55%  Study Conclusions  - Left ventricle: Poor endocardial definition   Possible posterior lateral hypokinesis on definity images. The   cavity size was mildly dilated. There was moderate concentric   hypertrophy. Systolic function was normal. The estimated ejection   fraction was in the range of 50% to 55%. - Mitral valve: There was mild regurgitation. - Left atrium: The atrium was mildly dilated. - Atrial septum: No defect or patent foramen ovale was identified.   Cath 05/16/2015 Conclusion    Mid RCA lesion, 100% stenosed.  SVG was injected is normal in caliber, and is anatomically normal.  LIMA was injected is normal in caliber, and is anatomically  normal.  1st Diag lesion, 100% stenosed.  Prox LAD to Mid LAD lesion, 100% stenosed.  Ost LAD to Prox LAD lesion, 95%  stenosed.  Prox Cx lesion, 100% stenosed.  SVG was injected is normal in caliber, and is anatomically normal.  was injected .  There is severe disease in the graft.  Origin lesion, 100% stenosed.  There is moderate left ventricular systolic dysfunction.   Acute coronary syndrome/STEMI with 1 mm ST elevation in leads 1 aVL, V5 and V6 with inferior T changes.  Moderate left ventricular dysfunction with severe hypo-to akinesis involving the mid anterolateral wall and hypokinesis of the mid inferior wall on the RAo projection with hypo-to akinesis involving the low to mid posterior lateral wall and the LAO projection.  Estimated ejection fraction is a aproximately 35-40%.  Significant native coronary artery disease with 95% proximal LAD stenosis after proximal septal and prior to takeoff of the first diagonal branch.  The diagonal branch was occluded in the proximal third of the vessel. The LAD was occluded shortly thereafter the diagonal takeoff; subtotal total occlusion of the AV groove circumflex; total occlusion of the mid RCA after the anterior RV marginal branch.  Patent LIMA graft supplying the mid LAD with filling of the LAD, proximal to the point of occlusion.  Patent SVG  supplying the distal obtuse marginal branch of the circumflex coronary artery.  Occluded vein graft which had supplied the diagonal vessel.  Patent SVG supplying the PDA branch of the distal RCA.  PTCA of the very proximal LAD prior to the diagonal/LAD bifurcation with a 95+ percent stenosis being reduced to 60% and unsuccessful attempt at wiring  the diagonal vessel due to the acute angle takeoff which was occluded in its proximal third segment and had been supplied by the vein graft.  RECOMMENDATION:  Medical therapy.      ASSESSMENT:    1. Coronary artery disease involving coronary bypass graft of native heart without angina pectoris   2. Essential hypertension   3. Hyperlipidemia,  unspecified hyperlipidemia type   4. Controlled type 2 diabetes mellitus without complication, without long-term current use of insulin ([place])   5. Bilateral carotid artery stenosis      PLAN:  In order of problems listed above:  1. CAD: Last cardiac catheterization was in 2016, PCI of diagonal was unsuccessful due to acute takeoff angle.  He has not had any exertional chest pain or significant shortness of breath.  Ejection fraction was initially low, however later normalized on echocardiogram.  Continue on aspirin, Lipitor, carvedilol, Imdur and Plavix.  I will defer to Dr. Stanford Breed to decide when to take him off of Plavix.  2. Hypertension: blood pressure very well controlled  3. Hyperlipidemia: Last blood work shows very well controlled LDL, mildly elevated triglyceride, continue on Lipitor 80 mg daily.  Recommend addition of some over-the-counter fish oil.  4. DM 2: Managed by primary care provider  5. Carotid artery disease: Followed by vascular surgery.  Pending upcoming ABI and carotid ultrasound in January.    Medication Adjustments/Labs and Tests Ordered: Current medicines are reviewed at length with the patient today.  Concerns regarding medicines are outlined above.  Medication changes, Labs and Tests ordered today are listed in the Patient Instructions below. Patient Instructions  Start taking Fish Oil 1200 mg daily  Continue all other medications   Your physician wants you to follow-up in: 1 year with Dr.Crenshaw. You will receive a reminder letter in the mail two months in  advance. If you don't receive a letter, please call our office to schedule the follow-up appointment.     Fernando Combs, [place]  11/28/2017 3:24 PM    [address], [place], [place]  [address] Phone: [phone]; Fax: [phone]

## 2017-11-26 NOTE — Patient Instructions (Signed)
Start taking Fish Oil 1200 mg daily  Continue all other medications   Your physician wants you to follow-up in: 1 year with Dr.Crenshaw. You will receive a reminder letter in the mail two months in advance. If you don't receive a letter, please call our office to schedule the follow-up appointment.

## 2017-11-28 ENCOUNTER — Encounter: Payer: Self-pay | Admitting: Physician Assistant

## 2017-11-28 MED ORDER — CLOPIDOGREL BISULFATE 75 MG PO TABS: 75.0000 mg | ORAL_TABLET | Freq: Every day | ORAL | 3 refills | Status: DC

## 2017-12-17 ENCOUNTER — Other Ambulatory Visit: Payer: Self-pay | Admitting: Adult Health

## 2017-12-17 DIAGNOSIS — Z760 Encounter for issue of repeat prescription: Secondary | ICD-10-CM

## 2017-12-17 DIAGNOSIS — E038 Other specified hypothyroidism: Secondary | ICD-10-CM

## 2017-12-17 DIAGNOSIS — E1142 Type 2 diabetes mellitus with diabetic polyneuropathy: Secondary | ICD-10-CM

## 2017-12-18 NOTE — Telephone Encounter (Signed)
Sent to the pharmacy by e-scribe. 

## 2017-12-23 ENCOUNTER — Encounter (HOSPITAL_COMMUNITY): Payer: Medicare Other

## 2017-12-23 ENCOUNTER — Other Ambulatory Visit (HOSPITAL_COMMUNITY): Payer: Medicare Other

## 2017-12-23 ENCOUNTER — Ambulatory Visit: Payer: Medicare Other | Admitting: Family

## 2017-12-28 ENCOUNTER — Ambulatory Visit (INDEPENDENT_AMBULATORY_CARE_PROVIDER_SITE_OTHER)
Admission: RE | Admit: 2017-12-28 | Discharge: 2017-12-28 | Disposition: A | Discharge status: Home / Self Care | Payer: Medicare Other | Source: Ambulatory Visit | Attending: Family | Admitting: Family

## 2017-12-28 ENCOUNTER — Ambulatory Visit: Payer: Medicare Other | Admitting: Family

## 2017-12-28 ENCOUNTER — Encounter: Payer: Self-pay | Admitting: Family

## 2017-12-28 ENCOUNTER — Ambulatory Visit (HOSPITAL_COMMUNITY)
Admission: RE | Admit: 2017-12-28 | Discharge: 2017-12-28 | Disposition: A | Discharge status: Home / Self Care | Payer: Medicare Other | Source: Ambulatory Visit | Attending: Family | Admitting: Family

## 2017-12-28 VITALS — BP 163/89 | HR 75 | Temp 98.4°F | Resp 18 | Ht 70.0 in | Wt 229.6 lb

## 2017-12-28 DIAGNOSIS — Z87891 Personal history of nicotine dependence: Secondary | ICD-10-CM | POA: Diagnosis not present

## 2017-12-28 DIAGNOSIS — I779 Disorder of arteries and arterioles, unspecified: Secondary | ICD-10-CM | POA: Insufficient documentation

## 2017-12-28 DIAGNOSIS — I6523 Occlusion and stenosis of bilateral carotid arteries: Secondary | ICD-10-CM | POA: Diagnosis not present

## 2017-12-28 DIAGNOSIS — Z95820 Peripheral vascular angioplasty status with implants and grafts: Secondary | ICD-10-CM

## 2017-12-28 DIAGNOSIS — Z959 Presence of cardiac and vascular implant and graft, unspecified: Secondary | ICD-10-CM

## 2017-12-28 NOTE — Progress Notes (Signed)
[facility] [place]   CC: Follow up peripheral artery occlusive disease  History of Present Illness Fernando Combs is a 72 y.o. male who underwent atherectomy and angioplasty of the stent in the right SFA in April 2013 by Dr. Kellie Simmering. The patient is status post right SFA to popliteal thrombectomy in 2009 with the placement of the right SFA stent in January 2012.  He also has a history of mild carotid artery stenosis. He returns today for yearly follow up.  He retired in 2015, worked at a [employer]. He denies claudication symptoms with walking,denies non-healing wounds. Denies ever having a stroke or TIA symptoms. He denies tingling, numbness, pain, or weakness in either UE, denies dizziness.  He reports burning and numbness in all toes, states this has worsened.   He had an MI in June 2016, had a left heart cath by Dr. Shelva Majestic at that time.  Pt Diabetic: Yes, 6.7 A1C on 10-01-17 (review of records) Pt smoker: formersmoker, quit in 2015  Pt meds include: Statin :Yes Betablocker: Yes ASA: Yes Other anticoagulants/antiplatelets: Plavix since his MI in June 2016     Past Medical History:  Diagnosis Date  . Benign prostatic hypertrophy   . CAD (coronary artery disease)    cath 05/16/2015 patent LIMA to LAD, SVG to OM, SVG to RPDA, SVG to diagonal was occluded. PTCA 95% prox LAD reduce down to 60%. Medical management  . Carotid artery occlusion   . Colitis, ischemic ([place]) 06/2005    history of  . Diabetes mellitus type II   . Diabetes, polyneuropathy ([place])   . Diabetic peripheral neuropathy ([place])   . DVT (deep venous thrombosis) ([place])   . Gout   . History of myocardial infarction   . History of syncope    most likely vasovagal  . History of tobacco abuse   . Hyperlipidemia   . Hypertension   . Hypothyroidism   . Myocardial infarction ([place])   . Peripheral vascular disease ([place])   . Shingles   . Thrombus 04/2008   acute thrombus of his  right superficial artery with claudication    Social History Social History   Tobacco Use  . Smoking status: Former Smoker    Years: 15.00    Types: Cigarettes    Last attempt to quit: 04/14/2011    Years since quitting: 6.7  . Smokeless tobacco: Never Used  Substance Use Topics  . Alcohol use: No  . Drug use: No    Family History Family History  Problem Relation Age of Onset  . Deep vein thrombosis Mother        Varicose Veins  . Heart disease Mother   . Hyperlipidemia Mother   . Hypertension Mother   . Peripheral vascular disease Mother   . Lung cancer Mother   . Stroke Father   . Deep vein thrombosis Father   . Diabetes Father   . Heart disease Father        Heart Disease  before age 38  . Hyperlipidemia Father   . Hypertension Father   . Heart attack Father   . Peripheral vascular disease Father   . Alcohol abuse Unknown   . Stroke Unknown   . Coronary artery disease Unknown   . Hyperlipidemia Unknown   . Deep vein thrombosis Brother   . Diabetes Brother   . Heart disease Brother        Heart Disease before age 21  . Hyperlipidemia Brother   .  Hypertension Brother   . Heart attack Brother   . Peripheral vascular disease Brother   . Lung cancer Sister   . Kidney cancer Brother        on hospice   . Other Neg Hx        denies family history of blood clots or blood disorder    Past Surgical History:  Procedure Laterality Date  . CARDIAC CATHETERIZATION N/A 05/15/2015   Procedure: Left Heart Cath and Cors/Grafts Angiography;  Surgeon: Troy Sine, MD;  Location: [facility];  Service: Cardiovascular;  Laterality: N/A;  . CATARACT EXTRACTION Bilateral   . CORONARY ARTERY BYPASS GRAFT  08/04/00   times 4 (left interanl mammary artery to the left anterior descending, saphenous vein,graft to diagonal, saphenous vein graft to obtuse marginal, spahenous vein graft to posterior descending-Surgeon  Tharon Aquas Tright III  . LOWER EXTREMITY ANGIOGRAM  March 09, 2012  . LOWER EXTREMITY ANGIOGRAM Right 03/09/2012   Procedure: LOWER EXTREMITY ANGIOGRAM;  Surgeon: Serafina Mitchell, MD;  Location: [facility] C[facility] LAB;  Service: Cardiovascular;  Laterality: Right;  . OTHER SURGICAL HISTORY  11/2010   angiogram, right leg stent placement-  -Dr Kellie Simmering  . peripheral vascular surgery    . PR VEIN BYPASS GRAFT,AORTO-FEM-POP  08/04/00    No Known Allergies  Current Outpatient Medications  Medication Sig Dispense Refill  . allopurinol (ZYLOPRIM) 100 MG tablet TAKE 2 TABLETS BY MOUTH  DAILY 180 tablet 2  . aspirin 81 MG chewable tablet Chew 1 tablet (81 mg total) by mouth daily.    Marland Kitchen atorvastatin (LIPITOR) 80 MG tablet TAKE 1 TABLET BY MOUTH  DAILY AT 6 P.M. 90 tablet 2  . carvedilol (COREG) 25 MG tablet Take 1 tablet by mouth two  times daily with a meal 180 tablet 3  . clopidogrel (PLAVIX) 75 MG tablet Take 1 tablet (75 mg total) by mouth daily. 90 tablet 3  . gabapentin (NEURONTIN) 100 MG capsule TAKE 1 CAPSULE BY MOUTH 3  TIMES DAILY 270 capsule 2  . isosorbide mononitrate (IMDUR) 60 MG 24 hr tablet TAKE 1 TABLET BY MOUTH  DAILY 90 tablet 2  . levothyroxine (SYNTHROID, LEVOTHROID) 88 MCG tablet TAKE 1 TABLET BY MOUTH  DAILY 90 tablet 2  . nitroGLYCERIN (NITROSTAT) 0.4 MG SL tablet DISSOLVE 1 TABLET UNDER THE TONGUE EVERY 5 MINUTES AS  NEEDED FOR CHEST PAIN FOR  UP TO 3 DOSES. SEEK MEDICAL ATTENTION IF NO RELIEF. 90 tablet 3  . sertraline (ZOLOFT) 25 MG tablet TAKE 1 TABLET BY MOUTH  DAILY 90 tablet 2  . tamsulosin (FLOMAX) 0.4 MG CAPS capsule TAKE 1 CAPSULE BY MOUTH  DAILY 90 capsule 2   Current Facility-Administered Medications  Medication Dose Route Frequency Provider Last Rate Last Dose  . 0.9 %  sodium chloride infusion  500 mL Intravenous Continuous Nandigam, Kavitha V, MD        ROS: See HPI for pertinent positives and negatives.   Physical Examination  Vitals:   12/28/17 1511 12/28/17 1515  BP: (!) 162/87 (!) 163/89  Pulse: 75   Resp: 18    Temp: 98.4 F (36.9 C)   TempSrc: Oral   SpO2: 95%   Weight: 229 lb 9.6 oz (104.1 kg)   Height: 5\' 10"  (1.778 m)    Body mass index is 32.94 kg/m.  General: WDWN obese male in NAD Gait: Normal HENT: WNL Eyes: PERRLA Pulmonary: normal non-labored breathing, no rales, rhonchi,or wheezing, good air movement in  all fields  Cardiac: RRR, no detected murmur Abdomen: soft, NT, no palpable masses Skin: no rashes, no ulcers, no cellulitis.  VASCULAR EXAM  Carotid Bruits Left Right   Negative Negative    Abdominal aortic pulse is not palpable. Radial pulses are 2+ and equal.   VASCULAR EXAM: Extremitieswithout ischemic changes  without Gangrene; without open wounds.     LE Pulses LEFT RIGHT   FEMORAL  3+palpable  3+palpable    POPLITEAL not palpable  not palpable   POSTERIOR TIBIAL  2+ palpable  not palpable    DORSALIS PEDIS  ANTERIOR TIBIAL 2+ palpable  2+palpable    Musculoskeletal: no muscle wasting or atrophy; no peripheral edema Neurologic:A&O X 3; appropriate affect , normal sensation bilaterally, MOTOR FUNCTION: 5/5 Symmetric, CN 2-12 intact Speech is fluent/normal. Psychiatric: Thought content is normal, mood appropriate for clinical situation.     ASSESSMENT: NELL SCHRACK is a 72 y.o. male who is s/p atherectomy and angioplasty of the stent in the right SFA in April 2013, and right SFA to popliteal thrombectomy in 2009 with placement of right SFA stent in January 2012.  He also has a history of mild carotid artery stenosis. He has no claudication symptoms, no signs of ischemia in his feet/legs.  He has no stroke or TIA history.  His atherosclerotic risk factors include currently controlled DM, former smoker,  and CAD.  DATA  Right LE Arterial Duplex (12/28/17): Right superficial femoral artery stent with 50%-99% stenosis proximal stent (290 cm/s),  (was 286 cm/s on 12-17-16) (was 266 cm/s at same area on 12-14-15). All waveforms are biphasic, triphasic proximal to stent.  No significant change compared to exams on 12-14-15 and 12-17-16.   ABI (Date: 12/28/2017):  R:   ABI: 1.12 (was 1.04 on 12-17-16),   PT: bi  DP: bi  TBI:  0.68 (was 0.76)  L:   ABI: 0.98 (was 1.13),   PT: bi  DP: tri  TBI: 0.65 (was 0.83)  Stable bilateral ABI with no significant disease, bi and triphasic waveforms.    Carotid Duplex (12-17-16):  <40% stenosis of the bilateral internal carotid artery. Bilateral vertebral artery flow is antegrade.  Bilateral subclavian artery waveforms are normal.  No significant change compared to last exam on 12-14-15.      PLAN:   Graduated walking program discussed and how to achieve. Based on today's exam and non-invasive vascular lab results, the patient will follow up in 1 year with the following tests:  ABI's, and right LE arterial Duplex, carotid duplex in 2 years.   I discussed in depth with the patient the nature of atherosclerosis, and emphasized the importance of maximal medical management including strict control of blood pressure, blood glucose, and lipid levels, obtaining regular exercise, and continued cessation of smoking.  The patient is aware that without maximal medical management the underlying atherosclerotic disease process will progress, limiting the benefit of any interventions.  The patient was given information about PAD including signs, symptoms, treatment, what symptoms should prompt the patient to seek immediate medical care, and risk reduction measures to take.  Clemon Chambers, RN, MSN, FNP-C [facility] Phone: [phone]  Clinic MD: Trula Slade  12/28/17 3:16 PM

## 2017-12-28 NOTE — Patient Instructions (Signed)

## 2017-12-30 LAB — VAS US LOWER EXTREMITY BYPASS GRAFT DUPLEX: RSFPPSV: -86 cm/s

## 2018-03-19 ENCOUNTER — Other Ambulatory Visit: Payer: Self-pay | Admitting: Internal Medicine

## 2018-03-19 DIAGNOSIS — Z760 Encounter for issue of repeat prescription: Secondary | ICD-10-CM

## 2018-03-19 DIAGNOSIS — I10 Essential (primary) hypertension: Secondary | ICD-10-CM

## 2018-03-19 NOTE — Telephone Encounter (Signed)
[facility] Patient 

## 2018-03-19 NOTE — Telephone Encounter (Signed)
Sent to the pharmacy by e-scribe.  Pt scheduled for follow up on 03/31/18

## 2018-03-31 ENCOUNTER — Encounter: Payer: Self-pay | Admitting: Adult Health

## 2018-03-31 ENCOUNTER — Ambulatory Visit (INDEPENDENT_AMBULATORY_CARE_PROVIDER_SITE_OTHER): Payer: Medicare Other | Admitting: Adult Health

## 2018-03-31 VITALS — BP 126/72 | Temp 98.3°F | Wt 227.0 lb

## 2018-03-31 DIAGNOSIS — I10 Essential (primary) hypertension: Secondary | ICD-10-CM

## 2018-03-31 DIAGNOSIS — E118 Type 2 diabetes mellitus with unspecified complications: Secondary | ICD-10-CM | POA: Diagnosis not present

## 2018-03-31 LAB — POCT GLYCOSYLATED HEMOGLOBIN (HGB A1C): HEMOGLOBIN A1C: 6.4

## 2018-03-31 NOTE — Progress Notes (Signed)
Subjective:    Patient ID: Fernando Combs, male    DOB: 02/10/1946, 72 y.o.   MRN: [id number]  HPI 72 year old male who  has a past medical history of Benign prostatic hypertrophy, CAD (coronary artery disease), Carotid artery occlusion, Colitis, ischemic ([place]) (06/2005), Diabetes mellitus type II, Diabetes, polyneuropathy ([place]), Diabetic peripheral neuropathy ([place]), DVT (deep venous thrombosis) ([place]), Gout, History of myocardial infarction, History of syncope, History of tobacco abuse, Hyperlipidemia, Hypertension, Hypothyroidism, Myocardial infarction [facility]), Peripheral vascular disease ([place]), Shingles, and Thrombus (04/2008).  He presents to the clinic today for six month follow up regarding hypertension and DM.   Hypertension - currently controlled with Coreg 25. He has not experienced any episodes of hypotension. Denies headaches, blurred vision, dizziness.   BP Readings from Last 3 Encounters:  03/31/18 126/72  12/28/17 (!) 163/89  11/26/17 118/72     DM - Currently diet controlled His last A1c 6 months ago was 6.7. He reports that he has been working on diet and is walking more often. He does not check his blood sugars on a daily basis.  Wt Readings from Last 3 Encounters:  03/31/18 227 lb (103 kg)  12/28/17 229 lb 9.6 oz (104.1 kg)  11/26/17 233 lb (105.7 kg)    Review of Systems   See HPI  Past Medical History:  Diagnosis Date  . Benign prostatic hypertrophy   . CAD (coronary artery disease)    cath 05/16/2015 patent LIMA to LAD, SVG to OM, SVG to RPDA, SVG to diagonal was occluded. PTCA 95% prox LAD reduce down to 60%. Medical management  . Carotid artery occlusion   . Colitis, ischemic ([place]) 06/2005    history of  . Diabetes mellitus type II   . Diabetes, polyneuropathy ([place])   . Diabetic peripheral neuropathy ([place])   . DVT (deep venous thrombosis) ([place])   . Gout   . History of myocardial infarction   . History of syncope    most likely vasovagal  . History of  tobacco abuse   . Hyperlipidemia   . Hypertension   . Hypothyroidism   . Myocardial infarction ([place])   . Peripheral vascular disease ([place])   . Shingles   . Thrombus 04/2008   acute thrombus of his right superficial artery with claudication    Social History   Socioeconomic History  . Marital status: Married    Spouse name: Not on file  . Number of children: Not on file  . Years of education: Not on file  . Highest education level: Not on file  Occupational History  . Not on file  Social Needs  . Financial resource strain: Not on file  . Food insecurity:    Worry: Not on file    Inability: Not on file  . Transportation needs:    Medical: Not on file    Non-medical: Not on file  Tobacco Use  . Smoking status: Former Smoker    Years: 15.00    Types: Cigarettes    Last attempt to quit: 04/14/2011    Years since quitting: 6.9  . Smokeless tobacco: Never Used  Substance and Sexual Activity  . Alcohol use: No  . Drug use: No  . Sexual activity: Not on file  Lifestyle  . Physical activity:    Days per week: Not on file    Minutes per session: Not on file  . Stress: Not on file  Relationships  . Social connections:    Talks on phone:  Not on file    Gets together: Not on file    Attends religious service: Not on file    Active member of club or organization: Not on file    Attends meetings of clubs or organizations: Not on file    Relationship status: Not on file  . Intimate partner violence:    Fear of current or ex partner: Not on file    Emotionally abused: Not on file    Physically abused: Not on file    Forced sexual activity: Not on file  Other Topics Concern  . Not on file  Social History Narrative   Occupation:  Retired from [employer]    Married - lives with wife (2nd marriage)   3 children     Alcohol use-no   Tobacco Use - Quit smoking 3 years ago           Past Surgical History:  Procedure Laterality Date  . CARDIAC CATHETERIZATION  N/A 05/15/2015   Procedure: Left Heart Cath and Cors/Grafts Angiography;  Surgeon: Troy Sine, MD;  Location: [place] [facility] LAB;  Service: Cardiovascular;  Laterality: N/A;  . CATARACT EXTRACTION Bilateral   . CORONARY ARTERY BYPASS GRAFT  08/04/00   times 4 (left interanl mammary artery to the left anterior descending, saphenous vein,graft to diagonal, saphenous vein graft to obtuse marginal, spahenous vein graft to posterior descending-Surgeon  Tharon Aquas Tright III  . LOWER EXTREMITY ANGIOGRAM  March 09, 2012  . LOWER EXTREMITY ANGIOGRAM Right 03/09/2012   Procedure: LOWER EXTREMITY ANGIOGRAM;  Surgeon: Serafina Mitchell, MD;  Location: [facility] CATH LAB;  Service: Cardiovascular;  Laterality: Right;  . OTHER SURGICAL HISTORY  11/2010   angiogram, right leg stent placement-  -Dr Kellie Simmering  . peripheral vascular surgery    . PR VEIN BYPASS GRAFT,AORTO-FEM-POP  08/04/00    Family History  Problem Relation Age of Onset  . Deep vein thrombosis Mother        Varicose Veins  . Heart disease Mother   . Hyperlipidemia Mother   . Hypertension Mother   . Peripheral vascular disease Mother   . Lung cancer Mother   . Stroke Father   . Deep vein thrombosis Father   . Diabetes Father   . Heart disease Father        Heart Disease  before age 57  . Hyperlipidemia Father   . Hypertension Father   . Heart attack Father   . Peripheral vascular disease Father   . Alcohol abuse Unknown   . Stroke Unknown   . Coronary artery disease Unknown   . Hyperlipidemia Unknown   . Deep vein thrombosis Brother   . Diabetes Brother   . Heart disease Brother        Heart Disease before age 49  . Hyperlipidemia Brother   . Hypertension Brother   . Heart attack Brother   . Peripheral vascular disease Brother   . Lung cancer Sister   . Kidney cancer Brother        on hospice   . Other Neg Hx        denies family history of blood clots or blood disorder    Not on File  Current Outpatient Medications on File  Prior to Visit  Medication Sig Dispense Refill  . allopurinol (ZYLOPRIM) 100 MG tablet TAKE 2 TABLETS BY MOUTH  DAILY 180 tablet 2  . aspirin 81 MG chewable tablet Chew 1 tablet (81 mg total) by mouth daily.    Marland Kitchen  atorvastatin (LIPITOR) 80 MG tablet TAKE 1 TABLET BY MOUTH  DAILY AT 6 P.M. 90 tablet 1  . carvedilol (COREG) 25 MG tablet Take 1 tablet by mouth two  times daily with a meal 180 tablet 3  . clopidogrel (PLAVIX) 75 MG tablet Take 1 tablet (75 mg total) by mouth daily. 90 tablet 3  . gabapentin (NEURONTIN) 100 MG capsule TAKE 1 CAPSULE BY MOUTH 3  TIMES DAILY 270 capsule 2  . isosorbide mononitrate (IMDUR) 60 MG 24 hr tablet TAKE 1 TABLET BY MOUTH  DAILY 90 tablet 1  . levothyroxine (SYNTHROID, LEVOTHROID) 88 MCG tablet TAKE 1 TABLET BY MOUTH  DAILY 90 tablet 2  . nitroGLYCERIN (NITROSTAT) 0.4 MG SL tablet DISSOLVE 1 TABLET UNDER THE TONGUE EVERY 5 MINUTES AS  NEEDED FOR CHEST PAIN FOR  UP TO 3 DOSES. SEEK MEDICAL ATTENTION IF NO RELIEF. 90 tablet 3  . Omega-3 Fatty Acids (FISH OIL) 1000 MG CPDR Take by mouth.    . sertraline (ZOLOFT) 25 MG tablet TAKE 1 TABLET BY MOUTH  DAILY 90 tablet 1  . tamsulosin (FLOMAX) 0.4 MG CAPS capsule TAKE 1 CAPSULE BY MOUTH  DAILY 90 capsule 2   Current Facility-Administered Medications on File Prior to Visit  Medication Dose Route Frequency Provider Last Rate Last Dose  . 0.9 %  sodium chloride infusion  500 mL Intravenous Continuous Nandigam, Kavitha V, MD        BP 126/72   Temp 98.3 F (36.8 C) (Oral)   Wt 227 lb (103 kg)   BMI 32.57 kg/m       Objective:   Physical Exam  Constitutional: He is oriented to person, place, and time. He appears well-developed and well-nourished. No distress.  Cardiovascular: Normal rate, regular rhythm, normal heart sounds and intact distal pulses. Exam reveals no gallop and no friction rub.  No murmur heard. Pulmonary/Chest: Effort normal and breath sounds normal. No stridor. No respiratory distress. He has no  wheezes. He has no rales. He exhibits no tenderness.  Neurological: He is alert and oriented to person, place, and time.  Skin: Skin is dry. Capillary refill takes less than 2 seconds. He is not diaphoretic.  Psychiatric: He has a normal mood and affect. His behavior is normal. Judgment and thought content normal.  Vitals reviewed.     Assessment & Plan:  1. Type 2 diabetes mellitus with complication, without long-term current use of insulin (HCC) - POCT A1C- 6.4. Has improved.  - Continue to work on diet and exercise  - Follow up in 6 months at CPE for recheck   2. Essential hypertension - At goal. No change in medications    Dorothyann Peng, NP

## 2018-08-11 ENCOUNTER — Other Ambulatory Visit: Payer: Self-pay | Admitting: Adult Health

## 2018-08-11 DIAGNOSIS — I10 Essential (primary) hypertension: Secondary | ICD-10-CM

## 2018-08-11 DIAGNOSIS — Z760 Encounter for issue of repeat prescription: Secondary | ICD-10-CM

## 2018-08-11 DIAGNOSIS — N400 Enlarged prostate without lower urinary tract symptoms: Secondary | ICD-10-CM

## 2018-08-11 NOTE — Telephone Encounter (Signed)
Sent to the pharmacy by e-scribe.  Pt is scheduled for cpx on 10/01/18.

## 2018-08-30 NOTE — Progress Notes (Signed)
Subjective:   Fernando Combs is a 72 y.o. male who presents for Medicare Annual/Subsequent preventive examination.  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 OPHTHALMOLOGY EXAM  07/13/1955  . URINE MICROALBUMIN  09/25/2017  . FOOT EXAM  09/30/2017  . COLONOSCOPY  10/29/2017  . INFLUENZA VACCINE  06/24/2018   PSA 09/2017 Ua Microalbumin in Nov when he gets his labs   Colonoscopy 10/2016 - had one 09/2016 and wanted to repeat in in 2018 - declined  Educated regarding cologuard and will discuss with Talbert Forest in Nov  Will get an eye exam soon   Educated regarding the shingrix         Objective:    Vitals: BP 110/70   Pulse 67   Ht 5\' 9"  (1.753 m)   Wt 235 lb (106.6 kg)   SpO2 97%   BMI 34.70 kg/m   Body mass index is 34.7 kg/m.  Advanced Directives 08/31/2018 08/27/2017 12/22/2016 10/29/2016 12/18/2015 05/15/2015 12/12/2014  Does Patient Have a Medical Advance Directive? No No No No No Yes No  Type of Advance Directive - - - - - Press photographer -  Would patient like information on creating a medical advance directive? - - - - - - No - patient declined information  Pre-existing out of facility DNR order (yellow form  or pink MOST form) - - - - - - -   Declines information at present Tobacco Social History   Tobacco Use  Smoking Status Former Smoker  . Packs/day: 1.00  . Years: 50.00  . Pack years: 50.00  . Types: Cigarettes  . Last attempt to quit: 04/14/2011  . Years since quitting: 7.3  Smokeless Tobacco Never Used     Counseling given: Yes   Clinical Intake:     Past Medical History:  Diagnosis Date  . Benign prostatic hypertrophy   . CAD (coronary artery disease)    cath 05/16/2015 patent LIMA to LAD, SVG to OM, SVG to RPDA, SVG to diagonal was occluded. PTCA 95% prox LAD reduce down to 60%. Medical management  . Carotid artery occlusion   . Colitis, ischemic ([place]) 06/2005    history of  . Diabetes mellitus type II   . Diabetes, polyneuropathy ([place])   . Diabetic peripheral neuropathy ([place])   . DVT (deep venous thrombosis) ([place])   . Gout   . History of myocardial infarction   . History of syncope    most likely vasovagal  . History of tobacco abuse   . Hyperlipidemia   . Hypertension   .  Hypothyroidism   . Myocardial infarction ([place])   . Peripheral vascular disease ([place])   . Shingles   . Thrombus 04/2008   acute thrombus of his right superficial artery with claudication   Past Surgical History:  Procedure Laterality Date  . CARDIAC CATHETERIZATION N/A 05/15/2015   Procedure: Left Heart Cath and Cors/Grafts Angiography;  Surgeon: Troy Sine, MD;  Location:  CV LAB;  Service: Cardiovascular;  Laterality: N/A;  . CATARACT EXTRACTION Bilateral   . CORONARY ARTERY BYPASS GRAFT  08/04/00   times 4 (left interanl mammary artery to the left anterior descending, saphenous vein,graft to diagonal, saphenous vein graft to obtuse marginal, spahenous vein graft to posterior descending-Surgeon  Tharon Aquas Tright III  . LOWER EXTREMITY ANGIOGRAM  March 09, 2012  . LOWER EXTREMITY ANGIOGRAM Right 03/09/2012   Procedure: LOWER EXTREMITY ANGIOGRAM;  Surgeon: Serafina Mitchell, MD;   Location: [facility] CATH LAB;  Service: Cardiovascular;  Laterality: Right;  . OTHER SURGICAL HISTORY  11/2010   angiogram, right leg stent placement-  -Dr Kellie Simmering  . peripheral vascular surgery    . PR VEIN BYPASS GRAFT,AORTO-FEM-POP  08/04/00   Family History  Problem Relation Age of Onset  . Deep vein thrombosis Mother        Varicose Veins  . Heart disease Mother   . Hyperlipidemia Mother   . Hypertension Mother   . Peripheral vascular disease Mother   . Lung cancer Mother   . Stroke Father   . Deep vein thrombosis Father   . Diabetes Father   . Heart disease Father        Heart Disease  before age 19  . Hyperlipidemia Father   . Hypertension Father   . Heart attack Father   . Peripheral vascular disease Father   . Alcohol abuse Unknown   . Stroke Unknown   . Coronary artery disease Unknown   . Hyperlipidemia Unknown   . Deep vein thrombosis Brother   . Diabetes Brother   . Heart disease Brother        Heart Disease before age 12  . Hyperlipidemia Brother   . Hypertension Brother   . Heart attack Brother   . Peripheral vascular disease Brother   . Lung cancer Sister   . Kidney cancer Brother        on hospice   . Other Neg Hx        denies family history of blood clots or blood disorder   Social History   Socioeconomic History  . Marital status: Married    Spouse name: Not on file  . Number of children: Not on file  . Years of education: Not on file  . Highest education level: Not on file  Occupational History  . Not on file  Social Needs  . Financial resource strain: Not on file  . Food insecurity:    Worry: Not on file    Inability: Not on file  . Transportation needs:    Medical: Not on file    Non-medical: Not on file  Tobacco Use  . Smoking status: Former Smoker    Packs/day: 1.00    Years: 50.00    Pack years: 50.00    Types: Cigarettes    Last attempt to quit: 04/14/2011    Years since quitting: 7.3  . Smokeless tobacco: Never Used  Substance and  Sexual Activity  . Alcohol use: No  . Drug use: No  . Sexual activity: Not on file  Lifestyle  . Physical activity:    Days per week: Not on file    Minutes per session: Not on file  . Stress: Not on file  Relationships  . Social connections:    Talks on phone: Not on file    Gets together: Not on file    Attends religious service: Not on file    Active member of club or organization: Not on file    Attends meetings of clubs or organizations: Not on file    Relationship status: Not on file  Other Topics Concern  . Not on file  Social History Narrative   Occupation:  Retired from [employer]    Married - lives with wife (2nd marriage)   3 children     Alcohol use-no   Tobacco Use - Quit smoking 3 years ago           Outpatient Encounter Medications as of 08/31/2018  Medication Sig  . allopurinol (ZYLOPRIM) 100 MG tablet TAKE 2 TABLETS BY MOUTH  DAILY  . amLODipine (NORVASC) 5 MG tablet TAKE 1 TABLET DAILY BY  MOUTH.  Marland Kitchen aspirin 81 MG chewable tablet Chew 1 tablet (81 mg total) by mouth daily.  Marland Kitchen atorvastatin (LIPITOR) 80 MG tablet TAKE 1 TABLET BY MOUTH  DAILY AT 6 P.M.  . carvedilol (COREG) 25 MG tablet Take 1 tablet by mouth two  times daily with a meal  . clopidogrel (PLAVIX) 75 MG tablet Take 1 tablet (75 mg total) by mouth daily.  Marland Kitchen gabapentin (NEURONTIN) 100 MG capsule TAKE 1 CAPSULE BY MOUTH 3  TIMES DAILY  . isosorbide mononitrate (IMDUR) 60 MG 24 hr tablet TAKE 1 TABLET BY MOUTH  DAILY  . levothyroxine (SYNTHROID, LEVOTHROID) 88 MCG tablet TAKE 1 TABLET BY MOUTH  DAILY  . nitroGLYCERIN (NITROSTAT) 0.4 MG SL tablet DISSOLVE 1 TABLET UNDER THE TONGUE EVERY 5 MINUTES AS  NEEDED FOR CHEST PAIN FOR  UP TO 3 DOSES. SEEK MEDICAL ATTENTION IF NO RELIEF.  Marland Kitchen Omega-3 Fatty Acids (FISH OIL) 1000 MG CPDR Take by mouth.  . sertraline (ZOLOFT) 25 MG tablet TAKE 1 TABLET BY MOUTH  DAILY  . tamsulosin (FLOMAX) 0.4 MG CAPS capsule TAKE 1 CAPSULE BY MOUTH  DAILY    Facility-Administered Encounter Medications as of 08/31/2018  Medication  . 0.9 %  sodium chloride infusion    Activities of Daily Living No flowsheet data found.  Patient Care Team: Dorothyann Peng, NP as PCP - General (Family Medicine) Stanford Breed Denice Bors, MD as PCP - Cardiology (Cardiology) Troy Sine, MD as Consulting Physician (Cardiology) Gean Birchwood, DPM as Consulting Physician (Podiatry) Stanford Breed Denice Bors, MD as Consulting Physician (Cardiology)    Assessment:   This is a routine wellness examination for Fernando Combs.  Exercise Activities and Dietary recommendations    Goals    . Exercise 150 minutes per week (moderate activity)     Will start walking; 1/4 mile  Daily  Take care of you     . Patient Stated     Be there for your grand children        Fall Risk Fall Risk  08/31/2018 08/27/2017 09/30/2016 06/08/2015 01/30/2014  Falls in the past year? No No No No No     Depression Screen PHQ 2/9 Scores 08/31/2018 08/27/2017 08/27/2017 09/30/2016  PHQ - 2 Score 0 0 0 0  PHQ- 9 Score - - - -   Lots of family stress but manages  Faith is important in  dealing with his issues   Cognitive Function MMSE - Mini Mental State Exam 08/27/2017  Not completed: (No Data)     Ad8 score reviewed for issues:  Issues making decisions:  Less interest in hobbies / activities:  Repeats questions, stories (family complaining):  Trouble using ordinary gadgets (microwave, computer, phone):  Forgets the month or year:   Mismanaging finances:   Remembering appts:  Daily problems with thinking and/or memory: Ad8 score is=0        Immunization History  Administered Date(s) Administered  . Influenza Whole 09/13/2008, 09/12/2009, 10/08/2010  . Influenza, High Dose Seasonal PF 12/05/2015, 08/07/2016, 08/27/2017  . Influenza,inj,Quad PF,6+ Mos 09/01/2014  . Influenza-Unspecified 09/24/2013, 07/25/2018  . Pneumococcal Conjugate-13 06/30/2014  . Pneumococcal  Polysaccharide-23 09/26/2008, 09/29/2009, 12/05/2015  . Td 06/30/2014     Screening Tests Health Maintenance  Topic Date Due  . OPHTHALMOLOGY EXAM  07/13/1955  . URINE MICROALBUMIN  09/25/2017  . FOOT EXAM  09/30/2017  . COLONOSCOPY  10/29/2017  . INFLUENZA VACCINE  06/24/2018  . HEMOGLOBIN A1C  10/01/2018  . TETANUS/TDAP  06/30/2024  . Hepatitis C Screening  Completed  . 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  Discussed LDCT and will discuss with Tommi Rumps in November   PSA 09/2017 Ua Microalbumin in Nov when he gets his labs   Colonoscopy 10/2016 - had one 09/2016 and wanted to repeat in in 2018 - declined  Educated regarding cologuard and will discuss with Talbert Forest in Nov  Will get an eye exam soon   Educated regarding the shingrix    Abnormal Screens    Referrals  As noted  Patient concerns; To discuss urine frequency and tingling in feet with [facility]   Nurse Concerns; As noted  Next PCP apt November 8th for CPE       I have personally reviewed and noted the following in the patient's chart:   . Medical and social history . Use of alcohol, tobacco or illicit drugs  . Current medications and supplements . Functional ability and status . Nutritional status . Physical activity . Advanced directives . List of other physicians . Hospitalizations, surgeries, and ER visits in previous 12 months . Vitals . Screenings to include cognitive, depression, and falls . Referrals and appointments  In addition, I have reviewed and discussed with patient certain preventive protocols, quality metrics, and best practice recommendations. A written personalized care plan for preventive services as well as general preventive health recommendations were provided to patient.     Wynetta Fines, RN  08/31/2018

## 2018-08-31 ENCOUNTER — Ambulatory Visit (INDEPENDENT_AMBULATORY_CARE_PROVIDER_SITE_OTHER): Payer: Medicare Other

## 2018-08-31 VITALS — BP 110/70 | HR 67 | Ht 69.0 in | Wt 235.0 lb

## 2018-08-31 DIAGNOSIS — E118 Type 2 diabetes mellitus with unspecified complications: Secondary | ICD-10-CM | POA: Diagnosis not present

## 2018-08-31 DIAGNOSIS — Z0000 Encounter for general adult medical examination without abnormal findings: Secondary | ICD-10-CM

## 2018-08-31 NOTE — Progress Notes (Signed)
I have reviewed documentation for AWV and Advance Care Planning provided by the health coach and agree with documentation. I was immediately available for questions.  

## 2018-08-31 NOTE — Patient Instructions (Addendum)
Fernando Combs , Thank you for taking time to come for your Medicare Wellness Visit. I appreciate your ongoing commitment to your health goals. Please review the following plan we discussed and let me know if I can assist you in the future.   [facility] should be calling for an apt   Will have an eye exam soon - to try and see him prior to seeing cory  Dr. Gershon Crane  Men Ages 73 to 73 Years who Have Ever Smoked  The [employer]PSTF recommends one-time screening for abdominal aortic aneurysm (AAA) with ultrasonography in men ages 35 to 39 years who have ever smoked.   Adults Aged 21-80, with a History of Smoking  The [employer]PSTF recommends annual screening for lung cancer with low-dose computed tomography (LDCT) in adults aged 23 to 110 years who have a 30 pack-year smoking history and currently smoke or have quit within the past 15 years. Screening should be discontinued once a person has not smoked for 15 years or develops a health problem that substantially limits life expectancy or the ability or willingness to have curative lung surgery.   IF you decide to do this, you can discuss with Tal[facility] Forest and you will be referred to pulmonary Eric Form NP ) and she will discuss the pros and cons of program.  Will order podiatry referral to assist with calluses etc   Shingrix is a vaccine for the prevention of Shingles in Adults 51 and older.  If you are on Medicare, the shingrix is covered under your Part D plan, so you will take both of the vaccines in the series at your pharmacy. Please check with your benefits regarding applicable copays or out of pocket expenses.  The Shingrix is given in 2 vaccines approx 8 weeks apart. You must receive the 2nd dose prior to 6 months from receipt of the first. Please have the pharmacist print out you Immunization  dates for our office records     These are the goals we discussed: Goals    . Exercise 150 minutes per week (moderate activity)     Will start walking; 1/4  mile  Daily  Take care of you        This is a list of the screening recommended for you and due dates:  Health Maintenance  Topic Date Due  . Eye exam for diabetics  07/13/1956  . Urine Protein Check  09/25/2017  . Complete foot exam   09/30/2017  . Colon Cancer Screening  10/29/2017  . Flu Shot  06/24/2018  . Hemoglobin A1C  10/01/2018  . Tetanus Vaccine  06/30/2024  .  Hepatitis C: One time screening is recommended by Center for Disease Control  (CDC) for  adults born from [birth year] through 1965.   Completed  . Pneumonia vaccines  Completed      Fall Prevention in the Home Falls can cause injuries. They can happen to people of all ages. There are many things you can do to make your home safe and to help prevent falls. What can I do on the outside of my home?  Regularly fix the edges of walkways and driveways and fix any cracks.  Remove anything that might make you trip as you walk through a door, such as a raised step or threshold.  Trim any bushes or trees on the path to your home.  Use bright outdoor lighting.  Clear any walking paths of anything that might make someone trip, such as rocks or  tools.  Regularly check to see if handrails are loose or broken. Make sure that both sides of any steps have handrails.  Any raised decks and porches should have guardrails on the edges.  Have any leaves, snow, or ice cleared regularly.  Use sand or salt on walking paths during winter.  Clean up any spills in your garage right away. This includes oil or grease spills. What can I do in the bathroom?  Use night lights.  Install grab bars by the toilet and in the tub and shower. Do not use towel bars as grab bars.  Use non-skid mats or decals in the tub or shower.  If you need to sit down in the shower, use a plastic, non-slip stool.  Keep the floor dry. Clean up any water that spills on the floor as soon as it happens.  Remove soap buildup in the tub or shower  regularly.  Attach bath mats securely with double-sided non-slip rug tape.  Do not have throw rugs and other things on the floor that can make you trip. What can I do in the bedroom?  Use night lights.  Make sure that you have a light by your bed that is easy to reach.  Do not use any sheets or blankets that are too big for your bed. They should not hang down onto the floor.  Have a firm chair that has side arms. You can use this for support while you get dressed.  Do not have throw rugs and other things on the floor that can make you trip. What can I do in the kitchen?  Clean up any spills right away.  Avoid walking on wet floors.  Keep items that you use a lot in easy-to-reach places.  If you need to reach something above you, use a strong step stool that has a grab bar.  Keep electrical cords out of the way.  Do not use floor polish or wax that makes floors slippery. If you must use wax, use non-skid floor wax.  Do not have throw rugs and other things on the floor that can make you trip. What can I do with my stairs?  Do not leave any items on the stairs.  Make sure that there are handrails on both sides of the stairs and use them. Fix handrails that are broken or loose. Make sure that handrails are as long as the stairways.  Check any carpeting to make sure that it is firmly attached to the stairs. Fix any carpet that is loose or worn.  Avoid having throw rugs at the top or bottom of the stairs. If you do have throw rugs, attach them to the floor with carpet tape.  Make sure that you have a light switch at the top of the stairs and the bottom of the stairs. If you do not have them, ask someone to add them for you. What else can I do to help prevent falls?  Wear shoes that: ? Do not have high heels. ? Have rubber bottoms. ? Are comfortable and fit you well. ? Are closed at the toe. Do not wear sandals.  If you use a stepladder: ? Make sure that it is fully  opened. Do not climb a closed stepladder. ? Make sure that both sides of the stepladder are locked into place. ? Ask someone to hold it for you, if possible.  Clearly mark and make sure that you can see: ? Any grab bars or handrails. ?  First and last steps. ? Where the edge of each step is.  Use tools that help you move around (mobility aids) if they are needed. These include: ? Canes. ? Walkers. ? Scooters. ? Crutches.  Turn on the lights when you go into a dark area. Replace any light bulbs as soon as they burn out.  Set up your furniture so you have a clear path. Avoid moving your furniture around.  If any of your floors are uneven, fix them.  If there are any pets around you, be aware of where they are.  Review your medicines with your doctor. Some medicines can make you feel dizzy. This can increase your chance of falling. Ask your doctor what other things that you can do to help prevent falls. This information is not intended to replace advice given to you by your health care provider. Make sure you discuss any questions you have with your health care provider. Document Released: 09/06/2009 Document Revised: 04/17/2016 Document Reviewed: 12/15/2014 Elsevier Interactive Patient Education  2018 [employer] Maintenance, Male A healthy lifestyle and preventive care is important for your health and wellness. Ask your health care provider about what schedule of regular examinations is right for you. What should I know about weight and diet? Eat a Healthy Diet  Eat plenty of vegetables, fruits, whole grains, low-fat dairy products, and lean protein.  Do not eat a lot of foods high in solid fats, added sugars, or salt.  Maintain a Healthy Weight Regular exercise can help you achieve or maintain a healthy weight. You should:  Do at least 150 minutes of exercise each week. The exercise should increase your heart rate and make you sweat (moderate-intensity  exercise).  Do strength-training exercises at least twice a week.  Watch Your Levels of Cholesterol and Blood Lipids  Have your blood tested for lipids and cholesterol every 5 years starting at 73 years of age. If you are at high risk for heart disease, you should start having your blood tested when you are 73 years old. You may need to have your cholesterol levels checked more often if: ? Your lipid or cholesterol levels are high. ? You are older than 73 years of age. ? You are at high risk for heart disease.  What should I know about cancer screening? Many types of cancers can be detected early and may often be prevented. Lung Cancer  You should be screened every year for lung cancer if: ? You are a current smoker who has smoked for at least 30 years. ? You are a former smoker who has quit within the past 15 years.  Talk to your health care provider about your screening options, when you should start screening, and how often you should be screened.  Colorectal Cancer  Routine colorectal cancer screening usually begins at 73 years of age and should be repeated every 5-10 years until you are 73 years old. You may need to be screened more often if early forms of precancerous polyps or small growths are found. Your health care provider may recommend screening at an earlier age if you have risk factors for colon cancer.  Your health care provider may recommend using home test kits to check for hidden blood in the stool.  A small camera at the end of a tube can be used to examine your colon (sigmoidoscopy or colonoscopy). This checks for the earliest forms of colorectal cancer.  Prostate and Testicular Cancer  Depending on your  age and overall health, your health care provider may do certain tests to screen for prostate and testicular cancer.  Talk to your health care provider about any symptoms or concerns you have about testicular or prostate cancer.  Skin Cancer  Check your skin  from head to toe regularly.  Tell your health care provider about any new moles or changes in moles, especially if: ? There is a change in a mole's size, shape, or color. ? You have a mole that is larger than a pencil eraser.  Always use sunscreen. Apply sunscreen liberally and repeat throughout the day.  Protect yourself by wearing long sleeves, pants, a wide-brimmed hat, and sunglasses when outside.  What should I know about heart disease, diabetes, and high blood pressure?  If you are 74-33 years of age, have your blood pressure checked every 3-5 years. If you are 38 years of age or older, have your blood pressure checked every year. You should have your blood pressure measured twice-once when you are at a hospital or clinic, and once when you are not at a hospital or clinic. Record the average of the two measurements. To check your blood pressure when you are not at a hospital or clinic, you can use: ? An automated blood pressure machine at a pharmacy. ? A home blood pressure monitor.  Talk to your health care provider about your target blood pressure.  If you are between 48-48 years old, ask your health care provider if you should take aspirin to prevent heart disease.  Have regular diabetes screenings by checking your fasting blood sugar level. ? If you are at a normal weight and have a low risk for diabetes, have this test once every three years after the age of 43. ? If you are overweight and have a high risk for diabetes, consider being tested at a younger age or more often.  A one-time screening for abdominal aortic aneurysm (AAA) by ultrasound is recommended for men aged 31-75 years who are current or former smokers. What should I know about preventing infection? Hepatitis B If you have a higher risk for hepatitis B, you should be screened for this virus. Talk with your health care provider to find out if you are at risk for hepatitis B infection. Hepatitis C Blood testing is  recommended for:  Everyone born from [birth year] through 1966.  Anyone with known risk factors for hepatitis C.  Sexually Transmitted Diseases (STDs)  You should be screened each year for STDs including gonorrhea and chlamydia if: ? You are sexually active and are younger than 72 years of age. ? You are older than 73 years of age and your health care provider tells you that you are at risk for this type of infection. ? Your sexual activity has changed since you were last screened and you are at an increased risk for chlamydia or gonorrhea. Ask your health care provider if you are at risk.  Talk with your health care provider about whether you are at high risk of being infected with HIV. Your health care provider may recommend a prescription medicine to help prevent HIV infection.  What else can I do?  Schedule regular health, dental, and eye exams.  Stay current with your vaccines (immunizations).  Do not use any tobacco products, such as cigarettes, chewing tobacco, and e-cigarettes. If you need help quitting, ask your health care provider.  Limit alcohol intake to no more than 2 drinks per day. One drink equals 12 ounces  of beer, 5 ounces of wine, or 1 ounces of hard liquor.  Do not use street drugs.  Do not share needles.  Ask your health care provider for help if you need support or information about quitting drugs.  Tell your health care provider if you often feel depressed.  Tell your health care provider if you have ever been abused or do not feel safe at home. This information is not intended to replace advice given to you by your health care provider. Make sure you discuss any questions you have with your health care provider. Document Released: 05/08/2008 Document Revised: 07/09/2016 Document Reviewed: 08/14/2015 Elsevier Interactive Patient Education  [employer].

## 2018-09-16 ENCOUNTER — Ambulatory Visit: Payer: Medicare Other | Admitting: Podiatry

## 2018-09-16 VITALS — BP 155/76 | HR 65

## 2018-09-16 DIAGNOSIS — D689 Coagulation defect, unspecified: Secondary | ICD-10-CM | POA: Diagnosis not present

## 2018-09-16 DIAGNOSIS — E119 Type 2 diabetes mellitus without complications: Secondary | ICD-10-CM

## 2018-09-16 DIAGNOSIS — B351 Tinea unguium: Secondary | ICD-10-CM | POA: Diagnosis not present

## 2018-09-16 DIAGNOSIS — Z0189 Encounter for other specified special examinations: Secondary | ICD-10-CM

## 2018-09-23 ENCOUNTER — Other Ambulatory Visit: Payer: Medicare Other | Admitting: Orthotics

## 2018-09-29 ENCOUNTER — Other Ambulatory Visit: Payer: Self-pay | Admitting: Adult Health

## 2018-09-29 ENCOUNTER — Other Ambulatory Visit: Payer: Self-pay | Admitting: Physician Assistant

## 2018-09-29 DIAGNOSIS — E038 Other specified hypothyroidism: Secondary | ICD-10-CM

## 2018-09-29 DIAGNOSIS — Z760 Encounter for issue of repeat prescription: Secondary | ICD-10-CM

## 2018-09-29 DIAGNOSIS — I10 Essential (primary) hypertension: Secondary | ICD-10-CM

## 2018-09-29 DIAGNOSIS — E1142 Type 2 diabetes mellitus with diabetic polyneuropathy: Secondary | ICD-10-CM

## 2018-10-01 ENCOUNTER — Ambulatory Visit (INDEPENDENT_AMBULATORY_CARE_PROVIDER_SITE_OTHER): Payer: Medicare Other | Admitting: Adult Health

## 2018-10-01 ENCOUNTER — Ambulatory Visit (INDEPENDENT_AMBULATORY_CARE_PROVIDER_SITE_OTHER)
Admission: RE | Admit: 2018-10-01 | Discharge: 2018-10-01 | Disposition: A | Discharge status: Home / Self Care | Payer: Medicare Other | Source: Ambulatory Visit | Attending: Adult Health | Admitting: Adult Health

## 2018-10-01 ENCOUNTER — Encounter: Payer: Self-pay | Admitting: Adult Health

## 2018-10-01 ENCOUNTER — Telehealth: Payer: Self-pay | Admitting: Physician Assistant

## 2018-10-01 ENCOUNTER — Other Ambulatory Visit: Payer: Self-pay | Admitting: Adult Health

## 2018-10-01 VITALS — BP 110/62 | HR 68 | Temp 98.0°F | Wt 235.0 lb

## 2018-10-01 DIAGNOSIS — E1149 Type 2 diabetes mellitus with other diabetic neurological complication: Secondary | ICD-10-CM

## 2018-10-01 DIAGNOSIS — Z0000 Encounter for general adult medical examination without abnormal findings: Secondary | ICD-10-CM | POA: Diagnosis not present

## 2018-10-01 DIAGNOSIS — N400 Enlarged prostate without lower urinary tract symptoms: Secondary | ICD-10-CM

## 2018-10-01 DIAGNOSIS — R5383 Other fatigue: Secondary | ICD-10-CM

## 2018-10-01 DIAGNOSIS — Z760 Encounter for issue of repeat prescription: Secondary | ICD-10-CM

## 2018-10-01 DIAGNOSIS — I10 Essential (primary) hypertension: Secondary | ICD-10-CM

## 2018-10-01 DIAGNOSIS — E038 Other specified hypothyroidism: Secondary | ICD-10-CM

## 2018-10-01 DIAGNOSIS — R61 Generalized hyperhidrosis: Secondary | ICD-10-CM

## 2018-10-01 DIAGNOSIS — E118 Type 2 diabetes mellitus with unspecified complications: Secondary | ICD-10-CM

## 2018-10-01 LAB — CBC WITH DIFFERENTIAL/PLATELET
BASOS ABS: 0.1 10*3/uL (ref 0.0–0.1)
Basophils Relative: 0.7 % (ref 0.0–3.0)
EOS ABS: 0.4 10*3/uL (ref 0.0–0.7)
Eosinophils Relative: 4.1 % (ref 0.0–5.0)
HCT: 42.2 % (ref 39.0–52.0)
HEMOGLOBIN: 14.3 g/dL (ref 13.0–17.0)
Lymphocytes Relative: 18.5 % (ref 12.0–46.0)
Lymphs Abs: 1.6 10*3/uL (ref 0.7–4.0)
MCHC: 33.9 g/dL (ref 30.0–36.0)
MCV: 88.8 fl (ref 78.0–100.0)
Monocytes Absolute: 0.7 10*3/uL (ref 0.1–1.0)
Monocytes Relative: 8.4 % (ref 3.0–12.0)
Neutro Abs: 6 10*3/uL (ref 1.4–7.7)
Neutrophils Relative %: 68.3 % (ref 43.0–77.0)
PLATELETS: 240 10*3/uL (ref 150.0–400.0)
RBC: 4.75 Mil/uL (ref 4.22–5.81)
RDW: 16 % — ABNORMAL HIGH (ref 11.5–15.5)
WBC: 8.8 10*3/uL (ref 4.0–10.5)

## 2018-10-01 LAB — LIPID PANEL
Cholesterol: 101 mg/dL (ref 0–200)
HDL: 38 mg/dL — ABNORMAL LOW (ref >39.00)
LDL Cholesterol: 29 mg/dL (ref 0–99)
NonHDL: 63.12
TRIGLYCERIDES: 172 mg/dL — AB (ref 0.0–149.0)
Total CHOL/HDL Ratio: 3
VLDL: 34.4 mg/dL (ref 0.0–40.0)

## 2018-10-01 LAB — BASIC METABOLIC PANEL
BUN: 31 mg/dL — AB (ref 6–23)
CO2: 25 meq/L (ref 19–32)
Calcium: 9.4 mg/dL (ref 8.4–10.5)
Chloride: 104 mEq/L (ref 96–112)
Creatinine, Ser: 1.71 mg/dL — ABNORMAL HIGH (ref 0.40–1.50)
GFR: 41.89 mL/min — AB (ref >60.00)
GLUCOSE: 178 mg/dL — AB (ref 70–99)
POTASSIUM: 4.4 meq/L (ref 3.5–5.1)
SODIUM: 138 meq/L (ref 135–145)

## 2018-10-01 LAB — HEPATIC FUNCTION PANEL
ALBUMIN: 4.5 g/dL (ref 3.5–5.2)
ALK PHOS: 85 U/L (ref 39–117)
ALT: 18 U/L (ref 0–53)
AST: 16 U/L (ref 0–37)
Bilirubin, Direct: 0.1 mg/dL (ref 0.0–0.3)
TOTAL PROTEIN: 7.6 g/dL (ref 6.0–8.3)
Total Bilirubin: 0.4 mg/dL (ref 0.2–1.2)

## 2018-10-01 LAB — VITAMIN D 25 HYDROXY (VIT D DEFICIENCY, FRACTURES): VITD: 17.79 ng/mL — ABNORMAL LOW (ref 30.00–100.00)

## 2018-10-01 LAB — PSA: PSA: 0.26 ng/mL (ref 0.10–4.00)

## 2018-10-01 LAB — VITAMIN B12: Vitamin B-12: 333 pg/mL (ref 211–911)

## 2018-10-01 LAB — TSH: TSH: 4.79 u[IU]/mL — AB (ref 0.35–4.50)

## 2018-10-01 LAB — HEMOGLOBIN A1C: HEMOGLOBIN A1C: 7.3 % — AB (ref 4.6–6.5)

## 2018-10-01 MED ORDER — LEVOTHYROXINE SODIUM 100 MCG PO TABS: 100.0000 ug | ORAL_TABLET | Freq: Every day | ORAL | 1 refills | Status: DC

## 2018-10-01 MED ORDER — CLOPIDOGREL BISULFATE 75 MG PO TABS: 75.0000 mg | ORAL_TABLET | Freq: Every day | ORAL | 0 refills | Status: DC

## 2018-10-01 NOTE — Progress Notes (Signed)
Subjective:    Patient ID: Fernando Combs, male    DOB: 1945-12-25, 72 y.o.   MRN: [id number]  HPI Patient presents for yearly preventative medicine examination. He is a pleasant 72 year old male who  has a past medical history of Benign prostatic hypertrophy, CAD (coronary artery disease), Carotid artery occlusion, Colitis, ischemic ([place]) (06/2005), Diabetes mellitus type II, Diabetes, polyneuropathy ([place]), Diabetic peripheral neuropathy ([place]), DVT (deep venous thrombosis) ([place]), Gout, History of myocardial infarction, History of syncope, History of tobacco abuse, Hyperlipidemia, Hypertension, Hypothyroidism, Myocardial infarction [facility]), Peripheral vascular disease ([place]), Shingles, and Thrombus (04/2008).  DM - diet controlled. He tries to eat healthy but does not exercise on a routine basis due to neuropathy. He has been seen by podiatry for foot care and they are trying to get him some diabetic shoes.  Lab Results  Component Value Date   HGBA1C 6.4 03/31/2018   Essential Hypertension - Takes Norvasc 5 mg and Coreg 25 mg daily BP Readings from Last 3 Encounters:  10/01/18 110/62  09/16/18 (!) 155/76  08/31/18 110/70   CAD-Managed by cardiology/Vascular.  History of multiple stents.  Last cardiac cath was in 2016.  He denies any exertional chest pain or significant shortness of breath.  He is continued on aspirin, Lipitor, carvedilol, M. [place], and Plavix  Hyperlipidemia -blood work showed well-controlled LDL with mildly elevated triglycerides.  Cardiology continued him on Lipitor 80 mg daily and recommended addition of some over-the-counter fish oil  Hypothyroidism -controlled with Synthroid 88 mcg daily  BPH -using Flomax 0.4 mg daily feels as though this is working well.   His biggest complaint today is that of chronic fatigue. This has been an ongoing issue for awhile at this point but he feels as though his symptoms are getting worse. He does not sleep well at night and has had night  sweats nightly for many years. He denies snoring or walking up feeling as though he is gasping for breath   All immunizations and health maintenance protocols were reviewed with the patient and needed orders were placed. utd   Appropriate screening laboratory values were ordered for the patient including screening of hyperlipidemia, renal function and hepatic function. If indicated by BPH, a PSA was ordered.  Medication reconciliation,  past medical history, social history, problem list and allergies were reviewed in detail with the patient  Goals were established with regard to weight loss, exercise, and  diet in compliance with medications Wt Readings from Last 3 Encounters:  10/01/18 235 lb (106.6 kg)  08/31/18 235 lb (106.6 kg)  03/31/18 227 lb (103 kg)     End of life planning was discussed.  He is due for colonoscopy but refuses colonoscopy at this time. He is not a canidate for cologuard. He has not had his diabetic eye exam this year   Review of Systems  Constitutional: Positive for diaphoresis and fatigue.  HENT: Negative.   Eyes: Negative.   Respiratory: Positive for shortness of breath (with exertion ).   Cardiovascular: Negative.   Gastrointestinal: Negative.   Endocrine: Negative.   Genitourinary: Negative.   Musculoskeletal: Negative.   Skin: Negative.   Allergic/Immunologic: Negative.   Neurological: Positive for numbness.  Hematological: Negative.   Psychiatric/Behavioral: Negative.   All other systems reviewed and are negative.  Past Medical History:  Diagnosis Date  . Benign prostatic hypertrophy   . CAD (coronary artery disease)    cath 05/16/2015 patent LIMA to LAD, SVG to OM, SVG to RPDA,  SVG to diagonal was occluded. PTCA 95% prox LAD reduce down to 60%. Medical management  . Carotid artery occlusion   . Colitis, ischemic ([place]) 06/2005    history of  . Diabetes mellitus type II   . Diabetes, polyneuropathy ([place])   . Diabetic peripheral neuropathy  ([place])   . DVT (deep venous thrombosis) ([place])   . Gout   . History of myocardial infarction   . History of syncope    most likely vasovagal  . History of tobacco abuse   . Hyperlipidemia   . Hypertension   . Hypothyroidism   . Myocardial infarction ([place])   . Peripheral vascular disease ([place])   . Shingles   . Thrombus 04/2008   acute thrombus of his right superficial artery with claudication    Social History   Socioeconomic History  . Marital status: Married    Spouse name: Not on file  . Number of children: Not on file  . Years of education: Not on file  . Highest education level: Not on file  Occupational History  . Not on file  Social Needs  . Financial resource strain: Not on file  . Food insecurity:    Worry: Not on file    Inability: Not on file  . Transportation needs:    Medical: Not on file    Non-medical: Not on file  Tobacco Use  . Smoking status: Former Smoker    Packs/day: 1.00    Years: 50.00    Pack years: 50.00    Types: Cigarettes    Last attempt to quit: 04/14/2011    Years since quitting: 7.4  . Smokeless tobacco: Never Used  Substance and Sexual Activity  . Alcohol use: No  . Drug use: No  . Sexual activity: Not on file  Lifestyle  . Physical activity:    Days per week: Not on file    Minutes per session: Not on file  . Stress: Not on file  Relationships  . Social connections:    Talks on phone: Not on file    Gets together: Not on file    Attends religious service: Not on file    Active member of club or organization: Not on file    Attends meetings of clubs or organizations: Not on file    Relationship status: Not on file  . Intimate partner violence:    Fear of current or ex partner: Not on file    Emotionally abused: Not on file    Physically abused: Not on file    Forced sexual activity: Not on file  Other Topics Concern  . Not on file  Social History Narrative   Occupation:  Retired from [employer]    Married -  lives with wife (2nd marriage)   3 children     Alcohol use-no   Tobacco Use - Quit smoking 3 years ago           Past Surgical History:  Procedure Laterality Date  . CARDIAC CATHETERIZATION N/A 05/15/2015   Procedure: Left Heart Cath and Cors/Grafts Angiography;  Surgeon: Troy Sine, MD;  Location: [facility];  Service: Cardiovascular;  Laterality: N/A;  . CATARACT EXTRACTION Bilateral   . CORONARY ARTERY BYPASS GRAFT  08/04/00   times 4 (left interanl mammary artery to the left anterior descending, saphenous vein,graft to diagonal, saphenous vein graft to obtuse marginal, spahenous vein graft to posterior descending-Surgeon  Tharon Aquas Tright III  . LOWER EXTREMITY ANGIOGRAM  March 09, 2012  . LOWER EXTREMITY ANGIOGRAM Right 03/09/2012   Procedure: LOWER EXTREMITY ANGIOGRAM;  Surgeon: Serafina Mitchell, MD;  Location: [facility] LAB;  Service: Cardiovascular;  Laterality: Right;  . OTHER SURGICAL HISTORY  11/2010   angiogram, right leg stent placement-  -Dr Kellie Simmering  . peripheral vascular surgery    . PR VEIN BYPASS GRAFT,AORTO-FEM-POP  08/04/00    Family History  Problem Relation Age of Onset  . Deep vein thrombosis Mother        Varicose Veins  . Heart disease Mother   . Hyperlipidemia Mother   . Hypertension Mother   . Peripheral vascular disease Mother   . Lung cancer Mother   . Stroke Father   . Deep vein thrombosis Father   . Diabetes Father   . Heart disease Father        Heart Disease  before age 69  . Hyperlipidemia Father   . Hypertension Father   . Heart attack Father   . Peripheral vascular disease Father   . Alcohol abuse Unknown   . Stroke Unknown   . Coronary artery disease Unknown   . Hyperlipidemia Unknown   . Deep vein thrombosis Brother   . Diabetes Brother   . Heart disease Brother        Heart Disease before age 45  . Hyperlipidemia Brother   . Hypertension Brother   . Heart attack Brother   . Peripheral vascular disease Brother   . Lung  cancer Sister   . Kidney cancer Brother        on hospice   . Other Neg Hx        denies family history of blood clots or blood disorder    Not on File  Current Outpatient Medications on File Prior to Visit  Medication Sig Dispense Refill  . allopurinol (ZYLOPRIM) 100 MG tablet TAKE 2 TABLETS BY MOUTH  DAILY 180 tablet 1  . amLODipine (NORVASC) 5 MG tablet TAKE 1 TABLET DAILY BY  MOUTH. 90 tablet 0  . aspirin 81 MG chewable tablet Chew 1 tablet (81 mg total) by mouth daily.    Marland Kitchen atorvastatin (LIPITOR) 80 MG tablet TAKE 1 TABLET BY MOUTH  DAILY AT 6 P.M. 90 tablet 1  . carvedilol (COREG) 25 MG tablet TAKE 1 TABLET BY MOUTH TWO  TIMES DAILY WITH  MEAL 180 tablet 3  . clopidogrel (PLAVIX) 75 MG tablet Take 1 tablet (75 mg total) by mouth daily. 90 tablet 3  . FLUAD 0.5 ML SUSY ADM 0.5ML IM UTD  0  . gabapentin (NEURONTIN) 100 MG capsule TAKE 1 CAPSULE BY MOUTH 3  TIMES DAILY 270 capsule 2  . isosorbide mononitrate (IMDUR) 60 MG 24 hr tablet TAKE 1 TABLET BY MOUTH  DAILY 90 tablet 1  . levothyroxine (SYNTHROID, LEVOTHROID) 88 MCG tablet TAKE 1 TABLET BY MOUTH  DAILY 90 tablet 1  . nitroGLYCERIN (NITROSTAT) 0.4 MG SL tablet DISSOLVE 1 TABLET UNDER THE TONGUE EVERY 5 MINUTES AS  NEEDED FOR CHEST PAIN FOR  UP TO 3 DOSES. SEEK MEDICAL ATTENTION IF NO RELIEF. 90 tablet 3  . Omega-3 Fatty Acids (FISH OIL) 1000 MG CPDR Take by mouth.    . sertraline (ZOLOFT) 25 MG tablet TAKE 1 TABLET BY MOUTH  DAILY 90 tablet 1  . tamsulosin (FLOMAX) 0.4 MG CAPS capsule TAKE 1 CAPSULE BY MOUTH  DAILY 90 capsule 0   Current Facility-Administered Medications on File Prior to Visit  Medication Dose Route  Frequency Provider Last Rate Last Dose  . 0.9 %  sodium chloride infusion  500 mL Intravenous Continuous Nandigam, Kavitha V, MD        BP 110/62 (BP Location: Left Arm, Patient Position: Sitting, Cuff Size: Large)   Pulse 68   Temp 98 F (36.7 C) (Oral)   Wt 235 lb (106.6 kg)   SpO2 96%   BMI 34.70 kg/m        Objective:   Physical Exam  Constitutional: He is oriented to person, place, and time. He appears well-developed and well-nourished. No distress.  Obese   HENT:  Head: Normocephalic and atraumatic.  Right Ear: External ear normal.  Left Ear: External ear normal.  Nose: Nose normal.  Mouth/Throat: Oropharynx is clear and moist. No oropharyngeal exudate.  Eyes: Pupils are equal, round, and reactive to light. Conjunctivae and EOM are normal. Right eye exhibits no discharge. Left eye exhibits no discharge. No scleral icterus.  Neck: Normal range of motion. Neck supple. No JVD present. No tracheal deviation present. No thyromegaly present.  Cardiovascular: Normal rate, regular rhythm, normal heart sounds and intact distal pulses. Exam reveals no gallop and no friction rub.  No murmur heard. Pulmonary/Chest: Effort normal and breath sounds normal. No stridor. No respiratory distress. He has no wheezes. He has no rales. He exhibits no tenderness.  Abdominal: Soft. Bowel sounds are normal. He exhibits no distension and no mass. There is no tenderness. There is no rebound and no guarding. No hernia.  Musculoskeletal: Normal range of motion. He exhibits no edema, tenderness or deformity.  Lymphadenopathy:    He has no cervical adenopathy.  Neurological: He is alert and oriented to person, place, and time. He displays normal reflexes. No cranial nerve deficit or sensory deficit. He exhibits normal muscle tone. Coordination normal.  Skin: Skin is warm and dry. Capillary refill takes less than 2 seconds. No rash noted. He is not diaphoretic. No erythema. No pallor.  Discoloration noted to left foot   Psychiatric: He has a normal mood and affect. His behavior is normal. Judgment and thought content normal.  Nursing note and vitals reviewed.     Assessment & Plan:  1. Fatigue, unspecified type - unknown cause, likely multi factorial. Sedentary lifestyle and chronic disease.  - Vitamin D,  25-hydroxy - Vitamin U31 - Basic metabolic panel - Hepatic function panel - Lipid panel - TSH - Hemoglobin A1c - DG Chest 2 View; Future  2. Routine general medical examination at a health care facility - Encouraged some form of exercise to help him lose weight. He has a stationary bike at home that he does not use . - Follow up with cardiology and vascular as directed  - Basic metabolic panel - Hepatic function panel - Lipid panel - TSH - Hemoglobin A1c - CBC with Differential/Platelet  3. Other specified hypothyroidism - consider dose change  - TSH  4. DM (diabetes mellitus), type 2 with complications ([place]) - consider adding agent  - Basic metabolic panel - Hepatic function panel - Lipid panel - TSH - Hemoglobin A1c - CBC with Differential/Platelet  5. Other diabetic neurological complication associated with type 2 diabetes mellitus ([place]) - Continue with Gabapentin   6. Essential hypertension - well controlled. No changes in medication  - Basic metabolic panel - Hepatic function panel - Lipid panel - TSH - Hemoglobin A1c - CBC with Differential/Platelet  7. Benign prostatic hyperplasia without lower urinary tract symptoms - Continue with flomax - PSA  8.  Night sweats - possibly due to thyroid disease? Doubt TB  - DG Chest 2 View; Future - CBC with Differential/Platelet   Dorothyann Peng, NP

## 2018-10-01 NOTE — Telephone Encounter (Signed)
Rx request from [facility] for Clopidogrel 75 mg. Rx request sent to pharmacy.

## 2018-10-01 NOTE — Telephone Encounter (Signed)
Rx request sent to pharmacy.  

## 2018-10-14 ENCOUNTER — Other Ambulatory Visit: Payer: Self-pay | Admitting: Adult Health

## 2018-10-14 DIAGNOSIS — N400 Enlarged prostate without lower urinary tract symptoms: Secondary | ICD-10-CM

## 2018-10-14 DIAGNOSIS — I10 Essential (primary) hypertension: Secondary | ICD-10-CM

## 2018-10-14 DIAGNOSIS — Z760 Encounter for issue of repeat prescription: Secondary | ICD-10-CM

## 2018-10-14 NOTE — Telephone Encounter (Signed)
Sent to the pharmacy by e-scribe. 

## 2018-10-20 ENCOUNTER — Telehealth: Payer: Self-pay | Admitting: Family Medicine

## 2018-10-20 NOTE — Telephone Encounter (Signed)
Received paper work from [employer].  They need the statement of the certifying physician for diabetic shoes.  Pt must be seen in the office by a licensed MD.  Florentina Addison see the pt for this exam.  Left a message for him to return my call.  Please schedule the pt to see Dr. Carolann Littler.  PEC agent may inform pt of above information.

## 2018-10-22 NOTE — Telephone Encounter (Signed)
Pt rtn he is schedule to come in w/ [facility] 10/27/18.

## 2018-10-26 ENCOUNTER — Other Ambulatory Visit: Payer: Self-pay | Admitting: Adult Health

## 2018-10-26 DIAGNOSIS — E038 Other specified hypothyroidism: Secondary | ICD-10-CM

## 2018-10-27 ENCOUNTER — Ambulatory Visit (INDEPENDENT_AMBULATORY_CARE_PROVIDER_SITE_OTHER): Payer: Medicare Other | Admitting: Family Medicine

## 2018-10-27 ENCOUNTER — Encounter: Payer: Self-pay | Admitting: Family Medicine

## 2018-10-27 VITALS — BP 106/60 | HR 66 | Temp 98.0°F | Wt 235.5 lb

## 2018-10-27 DIAGNOSIS — E1149 Type 2 diabetes mellitus with other diabetic neurological complication: Secondary | ICD-10-CM | POA: Diagnosis not present

## 2018-10-27 DIAGNOSIS — E118 Type 2 diabetes mellitus with unspecified complications: Secondary | ICD-10-CM | POA: Diagnosis not present

## 2018-10-27 LAB — T3, FREE: T3 FREE: 2.8 pg/mL (ref 2.3–4.2)

## 2018-10-27 LAB — TSH: TSH: 1.75 u[IU]/mL (ref 0.35–4.50)

## 2018-10-27 LAB — T4, FREE: FREE T4: 0.86 ng/dL (ref 0.60–1.60)

## 2018-10-27 NOTE — Telephone Encounter (Signed)
Filled for 2 months on 10/01/18.  Due for repeat lab work on 11/01/18.

## 2018-10-27 NOTE — Patient Instructions (Signed)

## 2018-10-27 NOTE — Progress Notes (Signed)
Subjective:     Patient ID: Fernando Combs, male   DOB: 21-Jun-1946, 72 y.o.   MRN: [id number]  HPI Patient is seen for oversight of diabetes care.  He has type 2 diabetes which is followed by her nurse practitioner.  He also sees podiatrist regularly.  He had recent prescription for diabetic shoes which required visit today.  He had A1c in November 7.3%.  Poor compliance with diet at that time but he has eliminated ice cream since then  He gets foot calluses trimmed every 3 months per podiatry.  He also has longstanding history of peripheral neuropathy with severe sensory impairment and both of these place him at very high risk for foot ulceration.  He checks his feet fairly regularly.  His diabetes is currently managed without medication.  He has follow-up in a couple months to recheck A1c.  Past Medical History:  Diagnosis Date  . Benign prostatic hypertrophy   . CAD (coronary artery disease)    cath 05/16/2015 patent LIMA to LAD, SVG to OM, SVG to RPDA, SVG to diagonal was occluded. PTCA 95% prox LAD reduce down to 60%. Medical management  . Carotid artery occlusion   . Colitis, ischemic ([place]) 06/2005    history of  . Diabetes mellitus type II   . Diabetes, polyneuropathy ([place])   . Diabetic peripheral neuropathy ([place])   . DVT (deep venous thrombosis) ([place])   . Gout   . History of myocardial infarction   . History of syncope    most likely vasovagal  . History of tobacco abuse   . Hyperlipidemia   . Hypertension   . Hypothyroidism   . Myocardial infarction ([place])   . Peripheral vascular disease ([place])   . Shingles   . Thrombus 04/2008   acute thrombus of his right superficial artery with claudication   Past Surgical History:  Procedure Laterality Date  . CARDIAC CATHETERIZATION N/A 05/15/2015   Procedure: Left Heart Cath and Cors/Grafts Angiography;  Surgeon: Troy Sine, MD;  Location: [facility];  Service: Cardiovascular;  Laterality: N/A;  . CATARACT EXTRACTION  Bilateral   . CORONARY ARTERY BYPASS GRAFT  08/04/00   times 4 (left interanl mammary artery to the left anterior descending, saphenous vein,graft to diagonal, saphenous vein graft to obtuse marginal, spahenous vein graft to posterior descending-Surgeon  Tharon Aquas Tright III  . LOWER EXTREMITY ANGIOGRAM  March 09, 2012  . LOWER EXTREMITY ANGIOGRAM Right 03/09/2012   Procedure: LOWER EXTREMITY ANGIOGRAM;  Surgeon: Serafina Mitchell, MD;  Location: [facility] CATH LAB;  Service: Cardiovascular;  Laterality: Right;  . OTHER SURGICAL HISTORY  11/2010   angiogram, right leg stent placement-  -Dr Kellie Simmering  . peripheral vascular surgery    . PR VEIN BYPASS GRAFT,AORTO-FEM-POP  08/04/00    reports that he quit smoking about 7 years ago. His smoking use included cigarettes. He has a 50.00 pack-year smoking history. He has never used smokeless tobacco. He reports that he does not drink alcohol or use drugs. family history includes Alcohol abuse in his unknown relative; Coronary artery disease in his unknown relative; Deep vein thrombosis in his brother, father, and mother; Diabetes in his brother and father; Heart attack in his brother and father; Heart disease in his brother, father, and mother; Hyperlipidemia in his brother, father, mother, and unknown relative; Hypertension in his brother, father, and mother; Kidney cancer in his brother; Lung cancer in his mother and sister; Peripheral vascular disease in his brother, father, and  mother; Stroke in his father and unknown relative. Not on File   Review of Systems  Constitutional: Negative for fatigue.  Eyes: Negative for visual disturbance.  Respiratory: Negative for cough, chest tightness and shortness of breath.   Cardiovascular: Negative for chest pain, palpitations and leg swelling.  Neurological: Positive for numbness. Negative for dizziness, syncope, weakness, light-headedness and headaches.       Objective:   Physical Exam  Constitutional: He appears  well-developed and well-nourished.  Cardiovascular: Normal rate and regular rhythm.  Pulmonary/Chest: Effort normal and breath sounds normal.  Musculoskeletal: He exhibits no edema.  Skin:  He has severe impairment with monofilament testing in both feet and even distal legs bilaterally.  He has very thickened calluses along the lateral distal aspect of both feet but no ulcerations.  Feet are warm to touch.  Good capillary refill.  Weak dorsalis pedis pulses bilaterally       Assessment:     Type 2 diabetes which has been managed with lifestyle modification and generally well-controlled until most recent A1c a month ago.  Patient has chronic calluses on both feet and severe peripheral neuropathy which places him at high risk for diabetic foot complications    Plan:     -Agree with prescription for diabetic shoes. -He will continue with regular podiatry follow-up -Handout on diabetic foot care given -He is encouraged to inspect feet daily to look for ulcerations or other changes -Continue close follow-up with primary regarding his A1c and diabetes maintenance  Eulas Post MD [facility]

## 2018-10-29 ENCOUNTER — Other Ambulatory Visit: Payer: Self-pay | Admitting: Family Medicine

## 2018-10-29 DIAGNOSIS — E038 Other specified hypothyroidism: Secondary | ICD-10-CM

## 2018-10-29 MED ORDER — LEVOTHYROXINE SODIUM 100 MCG PO TABS: 100.0000 ug | ORAL_TABLET | Freq: Every day | ORAL | 3 refills | Status: DC

## 2018-11-01 ENCOUNTER — Other Ambulatory Visit (INDEPENDENT_AMBULATORY_CARE_PROVIDER_SITE_OTHER): Payer: Medicare Other

## 2018-11-01 DIAGNOSIS — E038 Other specified hypothyroidism: Secondary | ICD-10-CM | POA: Diagnosis not present

## 2018-11-22 ENCOUNTER — Encounter: Payer: Self-pay | Admitting: Podiatry

## 2018-11-22 ENCOUNTER — Ambulatory Visit: Payer: Medicare Other | Admitting: Podiatry

## 2018-11-22 DIAGNOSIS — B351 Tinea unguium: Secondary | ICD-10-CM

## 2018-11-22 DIAGNOSIS — E1151 Type 2 diabetes mellitus with diabetic peripheral angiopathy without gangrene: Secondary | ICD-10-CM | POA: Diagnosis not present

## 2018-11-22 DIAGNOSIS — L84 Corns and callosities: Secondary | ICD-10-CM

## 2018-11-22 DIAGNOSIS — E1142 Type 2 diabetes mellitus with diabetic polyneuropathy: Secondary | ICD-10-CM | POA: Diagnosis not present

## 2018-11-30 ENCOUNTER — Ambulatory Visit (INDEPENDENT_AMBULATORY_CARE_PROVIDER_SITE_OTHER): Payer: Medicare Other | Admitting: Orthotics

## 2018-11-30 DIAGNOSIS — I70213 Atherosclerosis of native arteries of extremities with intermittent claudication, bilateral legs: Secondary | ICD-10-CM | POA: Diagnosis not present

## 2018-11-30 DIAGNOSIS — E118 Type 2 diabetes mellitus with unspecified complications: Secondary | ICD-10-CM | POA: Diagnosis not present

## 2018-11-30 DIAGNOSIS — E1141 Type 2 diabetes mellitus with diabetic mononeuropathy: Secondary | ICD-10-CM

## 2018-11-30 NOTE — Progress Notes (Signed)

## 2018-12-12 NOTE — Progress Notes (Signed)
Subjective:  Patient ID: Fernando Combs, male    DOB: [birth year],  MRN: [id number]  Chief Complaint  Patient presents with  . Callouses    Callouses both feet, pt requests trim.  . Nail Problem    Pt states nails are growing inward    73 y.o. male presents  for diabetic foot care. Last AMBS was 110. Denies numbness and tingling in their feet. Denies cramping in legs and thighs.  Review of Systems: Negative except as noted in the HPI. Denies N/V/F/Ch.  Past Medical History:  Diagnosis Date  . Benign prostatic hypertrophy   . CAD (coronary artery disease)    cath 05/16/2015 patent LIMA to LAD, SVG to OM, SVG to RPDA, SVG to diagonal was occluded. PTCA 95% prox LAD reduce down to 60%. Medical management  . Carotid artery occlusion   . Colitis, ischemic ([place]) 06/2005    history of  . Diabetes mellitus type II   . Diabetes, polyneuropathy ([place])   . Diabetic peripheral neuropathy ([place])   . DVT (deep venous thrombosis) ([place])   . Gout   . History of myocardial infarction   . History of syncope    most likely vasovagal  . History of tobacco abuse   . Hyperlipidemia   . Hypertension   . Hypothyroidism   . Myocardial infarction ([place])   . Peripheral vascular disease ([place])   . Shingles   . Thrombus 04/2008   acute thrombus of his right superficial artery with claudication    Current Outpatient Medications:  .  allopurinol (ZYLOPRIM) 100 MG tablet, TAKE 2 TABLETS BY MOUTH  DAILY, Disp: 180 tablet, Rfl: 1 .  amLODipine (NORVASC) 5 MG tablet, TAKE 1 TABLET BY MOUTH  DAILY, Disp: 90 tablet, Rfl: 3 .  aspirin 81 MG chewable tablet, Chew 1 tablet (81 mg total) by mouth daily., Disp: , Rfl:  .  atorvastatin (LIPITOR) 80 MG tablet, TAKE 1 TABLET BY MOUTH  DAILY AT 6 P.M., Disp: 90 tablet, Rfl: 1 .  carvedilol (COREG) 25 MG tablet, TAKE 1 TABLET BY MOUTH TWO  TIMES DAILY WITH  MEAL, Disp: 180 tablet, Rfl: 3 .  Cholecalciferol (VITAMIN D3) 125 MCG (5000 UT) TABS, Take by mouth., Disp: ,  Rfl:  .  clopidogrel (PLAVIX) 75 MG tablet, Take 1 tablet (75 mg total) by mouth daily., Disp: 90 tablet, Rfl: 0 .  FLUAD 0.5 ML SUSY, ADM 0.5ML IM UTD, Disp: , Rfl: 0 .  gabapentin (NEURONTIN) 100 MG capsule, TAKE 1 CAPSULE BY MOUTH 3  TIMES DAILY, Disp: 270 capsule, Rfl: 2 .  isosorbide mononitrate (IMDUR) 60 MG 24 hr tablet, TAKE 1 TABLET BY MOUTH  DAILY, Disp: 90 tablet, Rfl: 1 .  levothyroxine (SYNTHROID, LEVOTHROID) 100 MCG tablet, Take 1 tablet (100 mcg total) by mouth daily. PLEASE FILL FOR 1 YEAR.  THANKS, Disp: 90 tablet, Rfl: 3 .  nitroGLYCERIN (NITROSTAT) 0.4 MG SL tablet, DISSOLVE 1 TABLET UNDER THE TONGUE EVERY 5 MINUTES AS  NEEDED FOR CHEST PAIN FOR  UP TO 3 DOSES. SEEK MEDICAL ATTENTION IF NO RELIEF., Disp: 90 tablet, Rfl: 3 .  Omega-3 Fatty Acids (FISH OIL) 1000 MG CPDR, Take by mouth., Disp: , Rfl:  .  sertraline (ZOLOFT) 25 MG tablet, TAKE 1 TABLET BY MOUTH  DAILY, Disp: 90 tablet, Rfl: 1 .  tamsulosin (FLOMAX) 0.4 MG CAPS capsule, TAKE 1 CAPSULE BY MOUTH  DAILY, Disp: 90 capsule, Rfl: 3  Social History   Tobacco Use  Smoking Status Former  Smoker  . Packs/day: 1.00  . Years: 50.00  . Pack years: 50.00  . Types: Cigarettes  . Last attempt to quit: 04/14/2011  . Years since quitting: 7.6  Smokeless Tobacco Never Used    Not on File Objective:   Vitals:   09/16/18 0916  BP: (!) 155/76  Pulse: 65   There is no height or weight on file to calculate BMI. Constitutional Well developed. Well nourished.  Vascular Dorsalis pedis pulses present 1+ bilaterally  Posterior tibial pulses present 1+ bilat  Pedal hair growth diminished. Capillary refill normal to all digits.  No cyanosis or clubbing noted.  Neurologic Normal speech. Oriented to person, place, and time. Epicritic sensation to light touch grossly present bilaterally. Protective sensation with 5.07 monofilament  present bilaterally. Vibratory sensation present bilaterally.  Dermatologic Nails elongated,  thickened, dystrophic. No open wounds. No skin lesions.  Orthopedic: Normal joint ROM without pain or crepitus bilaterally. No visible deformities. No bony tenderness.   Assessment:   1. Onychomycosis   2. Coagulation defect ([place])   3. Encounter for diabetic foot exam [facility])    Plan:  Patient was evaluated and treated and all questions answered.  Diabetes with coag defect, Onychomycosis -Educated on diabetic footcare. Diabetic risk level 1 -Nails x10 debrided sharply and manually with large nail nipper and rotary burr.   Procedure: Nail Debridement Rationale: Patient meets criteria for routine foot care due to coag defect Type of Debridement: manual, sharp debridement. Instrumentation: Nail nipper, rotary burr. Number of Nails: 10  Return in about 9 weeks (around 11/18/2018) for Diabetic Foot Care.

## 2018-12-15 ENCOUNTER — Other Ambulatory Visit: Payer: Self-pay | Admitting: Cardiology

## 2018-12-18 ENCOUNTER — Encounter: Payer: Self-pay | Admitting: Podiatry

## 2018-12-18 NOTE — Progress Notes (Signed)
Subjective: Fernando Combs presents today with history of diabetic neuropathy with cc of painful, mycotic toenails.  Pain is aggravated when wearing enclosed shoe gear and relieved with periodic professional debridement.  Patient has peripheral neuropathy managed with Neurontin.  Dorothyann Peng, NP is his PCP.   Current Outpatient Medications:  .  allopurinol (ZYLOPRIM) 100 MG tablet, TAKE 2 TABLETS BY MOUTH  DAILY, Disp: 180 tablet, Rfl: 1 .  amLODipine (NORVASC) 5 MG tablet, TAKE 1 TABLET BY MOUTH  DAILY, Disp: 90 tablet, Rfl: 3 .  aspirin 81 MG chewable tablet, Chew 1 tablet (81 mg total) by mouth daily., Disp: , Rfl:  .  atorvastatin (LIPITOR) 80 MG tablet, TAKE 1 TABLET BY MOUTH  DAILY AT 6 P.M., Disp: 90 tablet, Rfl: 1 .  carvedilol (COREG) 25 MG tablet, TAKE 1 TABLET BY MOUTH TWO  TIMES DAILY WITH  MEAL, Disp: 180 tablet, Rfl: 3 .  Cholecalciferol (VITAMIN D3) 125 MCG (5000 UT) TABS, Take by mouth., Disp: , Rfl:  .  clopidogrel (PLAVIX) 75 MG tablet, TAKE 1 TABLET BY MOUTH  DAILY, Disp: 30 tablet, Rfl: 0 .  FLUAD 0.5 ML SUSY, ADM 0.5ML IM UTD, Disp: , Rfl: 0 .  gabapentin (NEURONTIN) 100 MG capsule, TAKE 1 CAPSULE BY MOUTH 3  TIMES DAILY, Disp: 270 capsule, Rfl: 2 .  isosorbide mononitrate (IMDUR) 60 MG 24 hr tablet, TAKE 1 TABLET BY MOUTH  DAILY, Disp: 90 tablet, Rfl: 1 .  levothyroxine (SYNTHROID, LEVOTHROID) 100 MCG tablet, Take 1 tablet (100 mcg total) by mouth daily. PLEASE FILL FOR 1 YEAR.  THANKS, Disp: 90 tablet, Rfl: 3 .  nitroGLYCERIN (NITROSTAT) 0.4 MG SL tablet, DISSOLVE 1 TABLET UNDER THE TONGUE EVERY 5 MINUTES AS  NEEDED FOR CHEST PAIN FOR  UP TO 3 DOSES. SEEK MEDICAL ATTENTION IF NO RELIEF., Disp: 90 tablet, Rfl: 3 .  Omega-3 Fatty Acids (FISH OIL) 1000 MG CPDR, Take by mouth., Disp: , Rfl:  .  sertraline (ZOLOFT) 25 MG tablet, TAKE 1 TABLET BY MOUTH  DAILY, Disp: 90 tablet, Rfl: 1 .  tamsulosin (FLOMAX) 0.4 MG CAPS capsule, TAKE 1 CAPSULE BY MOUTH  DAILY, Disp: 90  capsule, Rfl: 3  No Known Allergies   Objective:  Vascular Examination: Capillary refill time immediate to all 10 digits Dorsalis pedis pulses 1/4 bilaterally Posterior tibial pulses 1/4 bilaterally No ischemia noted bilaterally  Dermatological Examination: Skin with normal turgor, texture and tone b/l  Toenails 1-5 b/l discolored, thick, dystrophic with subungual debris and pain with palpation to nailbeds due to thickness of nails.  No open wounds noted bilaterally  Hyperkeratotic lesion plantar aspect left heel with tenderness to palpation.  No edema, no erythema, no drainage, no flocculence noted.  No impending wound.  Hyperkeratotic lesions noted submetatarsal heads 1, 4, 5 right foot and submetatarsal heads 2, 5 left foot.  No edema, no erythema, no drainage, no flocculence no impending wounds noted bilaterally.  Musculoskeletal: Muscle strength 5/5 to all muscle groups b/l  Neurological: Sensation with 10 gram monofilament is absent b/l Vibratory sensation absent b/l  Assessment: 1. Painful onychomycosis toenails 1-5 b/l 2. Multiple callosities noted plantar aspect left heel, submetatarsal heads 1, 4, 5 right foot and submetatarsal heads 2, 5 left foot 3. NIDDM with neuropathy 4. PAD  Plan: 1. Toenails 1-5 b/l were debrided in length and girth without iatrogenic bleeding. 2. Calluses paredplantar aspect left heel, submetatarsal heads 1, 4, 5 right foot and submetatarsal heads 2, 5 left foot.  Patient  noted relief post debridement. 3. Patient to continue soft, supportive shoe gear. 4. Patient to report any pedal injuries to medical professional  5. Follow up 3 months. [name] to call should there be a concern in the interim.

## 2019-02-25 ENCOUNTER — Ambulatory Visit: Payer: Medicare Other | Admitting: Podiatry

## 2019-02-25 ENCOUNTER — Encounter: Payer: Self-pay | Admitting: Podiatry

## 2019-02-25 ENCOUNTER — Other Ambulatory Visit: Payer: Self-pay

## 2019-02-25 VITALS — Temp 97.5°F

## 2019-02-25 DIAGNOSIS — B351 Tinea unguium: Secondary | ICD-10-CM

## 2019-02-25 DIAGNOSIS — M79675 Pain in left toe(s): Secondary | ICD-10-CM

## 2019-02-25 DIAGNOSIS — L84 Corns and callosities: Secondary | ICD-10-CM

## 2019-02-25 DIAGNOSIS — M79674 Pain in right toe(s): Secondary | ICD-10-CM

## 2019-02-25 DIAGNOSIS — E1142 Type 2 diabetes mellitus with diabetic polyneuropathy: Secondary | ICD-10-CM | POA: Diagnosis not present

## 2019-02-28 ENCOUNTER — Encounter: Payer: Self-pay | Admitting: Podiatry

## 2019-02-28 NOTE — Progress Notes (Signed)
Subjective: Fernando Combs presents for preventative diabetic foot care. He has h/o diabetic neuropathy painful, elongated toenails with multiple callosities.  Pain is aggravated when wearing enclosed shoe gear. Pain is relieved with periodic professional debridement.  Dorothyann Peng, NP is his PCP. Last visit was 10/01/2018.  Current Outpatient Medications:  .  allopurinol (ZYLOPRIM) 100 MG tablet, TAKE 2 TABLETS BY MOUTH  DAILY, Disp: 180 tablet, Rfl: 1 .  amLODipine (NORVASC) 5 MG tablet, TAKE 1 TABLET BY MOUTH  DAILY, Disp: 90 tablet, Rfl: 3 .  aspirin 81 MG chewable tablet, Chew 1 tablet (81 mg total) by mouth daily., Disp: , Rfl:  .  atorvastatin (LIPITOR) 80 MG tablet, TAKE 1 TABLET BY MOUTH  DAILY AT 6 P.M., Disp: 90 tablet, Rfl: 1 .  carvedilol (COREG) 25 MG tablet, TAKE 1 TABLET BY MOUTH TWO  TIMES DAILY WITH  MEAL, Disp: 180 tablet, Rfl: 3 .  Cholecalciferol (VITAMIN D3) 125 MCG (5000 UT) TABS, Take by mouth., Disp: , Rfl:  .  clopidogrel (PLAVIX) 75 MG tablet, TAKE 1 TABLET BY MOUTH  DAILY, Disp: 30 tablet, Rfl: 0 .  FLUAD 0.5 ML SUSY, ADM 0.5ML IM UTD, Disp: , Rfl: 0 .  gabapentin (NEURONTIN) 100 MG capsule, TAKE 1 CAPSULE BY MOUTH 3  TIMES DAILY, Disp: 270 capsule, Rfl: 2 .  isosorbide mononitrate (IMDUR) 60 MG 24 hr tablet, TAKE 1 TABLET BY MOUTH  DAILY, Disp: 90 tablet, Rfl: 1 .  levothyroxine (SYNTHROID, LEVOTHROID) 100 MCG tablet, Take 1 tablet (100 mcg total) by mouth daily. PLEASE FILL FOR 1 YEAR.  THANKS, Disp: 90 tablet, Rfl: 3 .  nitroGLYCERIN (NITROSTAT) 0.4 MG SL tablet, DISSOLVE 1 TABLET UNDER THE TONGUE EVERY 5 MINUTES AS  NEEDED FOR CHEST PAIN FOR  UP TO 3 DOSES. SEEK MEDICAL ATTENTION IF NO RELIEF., Disp: 90 tablet, Rfl: 3 .  Omega-3 Fatty Acids (FISH OIL) 1000 MG CPDR, Take by mouth., Disp: , Rfl:  .  sertraline (ZOLOFT) 25 MG tablet, TAKE 1 TABLET BY MOUTH  DAILY, Disp: 90 tablet, Rfl: 1 .  tamsulosin (FLOMAX) 0.4 MG CAPS capsule, TAKE 1 CAPSULE BY MOUTH  DAILY,  Disp: 90 capsule, Rfl: 3  No Known Allergies  Vascular Examination: Capillary refill time immediate x 10 digits.  Dorsalis pedis pulses 1/4 b/l.  Posterior tibial pulses 1/4 b/l.  Dgital hair absent x 10 digits.  Skin temperature gradient WNL b/l.  Dermatological Examination: Skin with normal turgor, texture and tone b/l.  Toenails 1-5 b/l discolored, thick, dystrophic with subungual debris and pain with palpation to nailbeds due to thickness of nails.  Hyperkeratotic lesions plantar left heel, submet heads 1, 4, 5 right foot, submet heads, 2, 5 left foot. No erythema, no edema, no drainage, no flocculence noted.   Musculoskeletal: Muscle strength 5/5 to all LE muscle groups  Neurological: Sensation absent with 10 gram monofilament. Vibratory sensation absent b/l.  Assessment: 1. Painful onychomycosis toenails 1-5 b/l 2. Calluses left heel, submet heads 1, 4, 5 right foot, submet heads, 2, 5 left foot 3. NIDDM with Diabetic neuropathy  Plan: 1. Continue diabetic foot care principles. Literature dispensed on today. 2. Toenails 1-5 b/l were debrided in length and girth without iatrogenic bleeding. 3. Calluses pared left heel, submet heads 1, 4, 5 right foot, submet heads, 2, 5 left foot utilizing sterile scalpel blade without incident.   4. Patient to continue soft, supportive shoe gear 5. Patient to report any pedal injuries to medical professional  6. Follow  up 10 weeks. 7. [name] to call should there be a concern in the interim.

## 2019-03-14 NOTE — Progress Notes (Signed)
.    Virtual Visit via Video Note changed to phone visit as patient did not have a smart phone.   This visit type was conducted due to national recommendations for restrictions regarding the COVID-19 Pandemic (e.g. social distancing) in an effort to limit this patient's exposure and mitigate transmission in our community.  Due to his co-morbid illnesses, this patient is at least at moderate risk for complications without adequate follow up.  This format is felt to be most appropriate for this patient at this time.  All issues noted in this document were discussed and addressed.  A limited physical exam was performed with this format.  Please refer to the patient's chart for his consent to telehealth for [facility].   Evaluation Performed:  Follow-up visit  Date:  03/21/2019   ID:  Fernando Combs, DOB [birth year], MRN [id number]  Patient Location: Home Provider Location: Home  PCP:  Dorothyann Peng, NP  Cardiologist:  Kirk Ruths, MD   Chief Complaint:  FU CAD  History of Present Illness:    FU hx of HLD, HTN, DM2, CAD s/p CABG 2001 with LIMA to LAD, SVG to diagonal, SVG to OM, SVG to RPDA, carotid stenosis; CKD.  Admitted 6/16 with a lateral STEMI. LHC showed patent LIMA to LAD, SVG to OM, SVG to RPDA, SVG to diagonal was occluded with severe native D1 disease. He was noted to have 95% proximal LAD stenosis after proximal septal and prior to the takeoff of D1 branch, the diagonal branch was occluded at the proximal third of the vessel, LAD was occluded shortly after the diagonal takeoff, subtotal occlusion of the AV groove circumflex and total occlusion of the native mid RCA after anterior RV marginal branch. PTCA was performed on the very proximal LAD prior to the diagonal and LAD bifurcation with 95% stenosis being reduced down to 60%. There was unsuccessful attempt at wiring the diagonal vessel due to acute angle takeoff which was occluded at the proximal third segment and had been  supplied by the vein graft. Medical therapy was recommended. Post PCI, he continued to have chest discomfort which worsened with weaning of IV nitroglycerin. He was placed on oral nitrates with resolution of symptoms. EF on left ventriculogram during cardiac catheterization was 35-40%. Follow-up echo demonstrated EF 50-55% with possible posterior lateral HK, mild MR, mild LAE.  Since last seen, patient has chest pain with vigorous activities relieved with rest.  This is unchanged.  He does not have chest pain with normal activities or at rest.  He denies dyspnea, palpitations or syncope.  The patient does not have symptoms concerning for COVID-19 infection (fever, chills, cough, or new shortness of breath).    Past Medical History:  Diagnosis Date   Benign prostatic hypertrophy    CAD (coronary artery disease)    cath 05/16/2015 patent LIMA to LAD, SVG to OM, SVG to RPDA, SVG to diagonal was occluded. PTCA 95% prox LAD reduce down to 60%. Medical management   Carotid artery occlusion    Colitis, ischemic ([place]) 06/2005    history of   Diabetes mellitus type II    Diabetes, polyneuropathy ([place])    Diabetic peripheral neuropathy ([place])    DVT (deep venous thrombosis) (H[facility])    Gout    History of myocardial infarction    History of syncope    most likely vasovagal   History of tobacco abuse    Hyperlipidemia    Hypertension    Hypothyroidism    Myocardial  infarction [facility])    Peripheral vascular disease ([place])    Shingles    Thrombus 04/2008   acute thrombus of his right superficial artery with claudication   Past Surgical History:  Procedure Laterality Date   CARDIAC CATHETERIZATION N/A 05/15/2015   Procedure: Left Heart Cath and Cors/Grafts Angiography;  Surgeon: Troy Sine, MD;  Location: [facility];  Service: Cardiovascular;  Laterality: N/A;   CATARACT EXTRACTION Bilateral    CORONARY ARTERY BYPASS GRAFT  08/04/00   times 4 (left interanl mammary  artery to the left anterior descending, saphenous vein,graft to diagonal, saphenous vein graft to obtuse marginal, spahenous vein graft to posterior descending-Surgeon  Tharon Aquas Tright III   LOWER EXTREMITY ANGIOGRAM  March 09, 2012   LOWER EXTREMITY ANGIOGRAM Right 03/09/2012   Procedure: LOWER EXTREMITY ANGIOGRAM;  Surgeon: Serafina Mitchell, MD;  Location: [facility] CATH LAB;  Service: Cardiovascular;  Laterality: Right;   OTHER SURGICAL HISTORY  11/2010   angiogram, right leg stent placement-  -Dr Kellie Simmering   peripheral vascular surgery     PR VEIN BYPASS GRAFT,AORTO-FEM-POP  08/04/00     Current Meds  Medication Sig   allopurinol (ZYLOPRIM) 100 MG tablet TAKE 2 TABLETS BY MOUTH  DAILY   amLODipine (NORVASC) 5 MG tablet TAKE 1 TABLET BY MOUTH  DAILY   aspirin 81 MG chewable tablet Chew 1 tablet (81 mg total) by mouth daily.   atorvastatin (LIPITOR) 80 MG tablet TAKE 1 TABLET BY MOUTH  DAILY AT 6 P.M.   carvedilol (COREG) 25 MG tablet TAKE 1 TABLET BY MOUTH TWO  TIMES DAILY WITH  MEAL   Cholecalciferol (VITAMIN D3) 125 MCG (5000 UT) TABS Take by mouth.   clopidogrel (PLAVIX) 75 MG tablet TAKE 1 TABLET BY MOUTH  DAILY   FLUAD 0.5 ML SUSY ADM 0.5ML IM UTD   gabapentin (NEURONTIN) 100 MG capsule TAKE 1 CAPSULE BY MOUTH 3  TIMES DAILY   isosorbide mononitrate (IMDUR) 60 MG 24 hr tablet TAKE 1 TABLET BY MOUTH  DAILY   levothyroxine (SYNTHROID, LEVOTHROID) 100 MCG tablet Take 1 tablet (100 mcg total) by mouth daily. PLEASE FILL FOR 1 YEAR.  THANKS   nitroGLYCERIN (NITROSTAT) 0.4 MG SL tablet DISSOLVE 1 TABLET UNDER THE TONGUE EVERY 5 MINUTES AS  NEEDED FOR CHEST PAIN FOR  UP TO 3 DOSES. SEEK MEDICAL ATTENTION IF NO RELIEF.   Omega-3 Fatty Acids (FISH OIL) 1000 MG CPDR Take by mouth.   sertraline (ZOLOFT) 25 MG tablet TAKE 1 TABLET BY MOUTH  DAILY   tamsulosin (FLOMAX) 0.4 MG CAPS capsule TAKE 1 CAPSULE BY MOUTH  DAILY     Allergies:   Patient has no known allergies.   Social History    Tobacco Use   Smoking status: Former Smoker    Packs/day: 1.00    Years: 50.00    Pack years: 50.00    Types: Cigarettes    Last attempt to quit: 04/14/2011    Years since quitting: 7.9   Smokeless tobacco: Never Used  Substance Use Topics   Alcohol use: No   Drug use: No     Family Hx: The patient's family history includes Alcohol abuse in his unknown relative; Coronary artery disease in his unknown relative; Deep vein thrombosis in his brother, father, and mother; Diabetes in his brother and father; Heart attack in his brother and father; Heart disease in his brother, father, and mother; Hyperlipidemia in his brother, father, mother, and unknown relative; Hypertension in his  brother, father, and mother; Kidney cancer in his brother; Lung cancer in his mother and sister; Peripheral vascular disease in his brother, father, and mother; Stroke in his father and unknown relative. There is no history of Other.  ROS:   Please see the history of present illness.    Patient denies fevers, chills, productive cough or hemoptysis. All other systems reviewed and are negative.  Recent Labs: 10/01/2018: ALT 18; BUN 31; Creatinine, Ser 1.71; Hemoglobin 14.3; Platelets 240.0; Potassium 4.4; Sodium 138 10/27/2018: TSH 1.75   Recent Lipid Panel Lab Results  Component Value Date/Time   CHOL 101 10/01/2018 10:36 AM   TRIG 172.0 (H) 10/01/2018 10:36 AM   HDL 38.00 (L) 10/01/2018 10:36 AM   CHOLHDL 3 10/01/2018 10:36 AM   LDLCALC 29 10/01/2018 10:36 AM    Wt Readings from Last 3 Encounters:  03/21/19 220 lb (99.8 kg)  10/27/18 235 lb 8 oz (106.8 kg)  10/01/18 235 lb (106.6 kg)     Objective:    Vital Signs:  BP 124/68    Ht 5\' 9"  (1.753 m)    Wt 220 lb (99.8 kg)    BMI 32.49 kg/m    VITAL SIGNS:  reviewed  Patient answers questions appropriately. Normal affect Remainder of physical examination not performed (telehealth visit; coronavirus pandemic)  ASSESSMENT & PLAN:     1. CAD-Continue medical therapy with ASA and statin.  Discontinue Plavix.  Patient has stable angina with only vigorous activities.  Continue beta-blocker, metoprolol and isosorbide. 2. Hypertension-patient's blood pressure is controlled.  Continue present medications and follow. 3. Hyperlipidemia-continue statin.  Choice from October 01, 2018 personally reviewed.  LDL 29 and liver functions normal. 4. Carotid artery disease-followed by vascular surgery. 5. Peripheral vascular disease-followed by vascular surgery.  COVID-19 Education: The importance of social distancing was discussed today.  Time:   Today, I have spent 15 minutes with the patient with telehealth technology discussing the above problems.     Medication Adjustments/Labs and Tests Ordered: Current medicines are reviewed at length with the patient today.  Concerns regarding medicines are outlined above.   Tests Ordered: No orders of the defined types were placed in this encounter.   Medication Changes: No orders of the defined types were placed in this encounter.   Disposition:  Follow up in 6 month(s)  Signed, Kirk Ruths, MD  03/21/2019 1:46 PM    [facility]

## 2019-03-18 ENCOUNTER — Telehealth: Payer: Self-pay | Admitting: Cardiology

## 2019-03-18 NOTE — Telephone Encounter (Signed)
Mychart pending, no smartphone, pre reg complete 03/18/19 AF

## 2019-03-21 ENCOUNTER — Telehealth (INDEPENDENT_AMBULATORY_CARE_PROVIDER_SITE_OTHER): Payer: Medicare Other | Admitting: Cardiology

## 2019-03-21 VITALS — BP 124/68 | Ht 69.0 in | Wt 220.0 lb

## 2019-03-21 DIAGNOSIS — I10 Essential (primary) hypertension: Secondary | ICD-10-CM

## 2019-03-21 DIAGNOSIS — I2510 Atherosclerotic heart disease of native coronary artery without angina pectoris: Secondary | ICD-10-CM | POA: Diagnosis not present

## 2019-03-21 DIAGNOSIS — E7800 Pure hypercholesterolemia, unspecified: Secondary | ICD-10-CM

## 2019-03-21 NOTE — Patient Instructions (Signed)
Medication Instructions:  STOP PLAVIX If you need a refill on your cardiac medications before your next appointment, please call your pharmacy.   Lab work: If you have labs (blood work) drawn today and your tests are completely normal, you will receive your results only by: Marland Kitchen MyChart Message (if you have MyChart) OR . A paper copy in the mail If you have any lab test that is abnormal or we need to change your treatment, we will call you to review the results.  Follow-Up: At [facility], you and your health needs are our priority.  As part of our continuing mission to provide you with exceptional heart care, we have created designated Provider Care Teams.  These Care Teams include your primary Cardiologist (physician) and Advanced Practice Providers (APPs -  Physician Assistants and Nurse Practitioners) who all work together to provide you with the care you need, when you need it. You will need a follow up appointment in 6 months.  Please call our office 2 months in advance to schedule this appointment.  You may see Kirk Ruths, MD or one of the following Advanced Practice Providers on your designated Care Team:   Kerin Ransom, PA-C Roby Lofts, [place] . Sande Rives, PA-C

## 2019-03-21 NOTE — Addendum Note (Signed)
Addended by: Cristopher Estimable on: 03/21/2019 02:00 PM   Modules accepted: Orders

## 2019-03-31 ENCOUNTER — Other Ambulatory Visit: Payer: Self-pay

## 2019-03-31 ENCOUNTER — Other Ambulatory Visit (INDEPENDENT_AMBULATORY_CARE_PROVIDER_SITE_OTHER): Payer: Medicare Other

## 2019-03-31 ENCOUNTER — Other Ambulatory Visit: Payer: Self-pay | Admitting: Family Medicine

## 2019-03-31 DIAGNOSIS — E118 Type 2 diabetes mellitus with unspecified complications: Secondary | ICD-10-CM

## 2019-03-31 LAB — HEMOGLOBIN A1C: Hgb A1c MFr Bld: 7.9 % — ABNORMAL HIGH (ref 4.6–6.5)

## 2019-04-01 ENCOUNTER — Ambulatory Visit (INDEPENDENT_AMBULATORY_CARE_PROVIDER_SITE_OTHER): Payer: Medicare Other | Admitting: Adult Health

## 2019-04-01 ENCOUNTER — Encounter: Payer: Self-pay | Admitting: Adult Health

## 2019-04-01 DIAGNOSIS — E118 Type 2 diabetes mellitus with unspecified complications: Secondary | ICD-10-CM

## 2019-04-01 MED ORDER — METFORMIN HCL 500 MG PO TABS: 500.0000 mg | ORAL_TABLET | Freq: Every day | ORAL | 0 refills | Status: DC

## 2019-04-01 NOTE — Progress Notes (Signed)
Virtual Visit via Telephone Note  I connected with [name] on 04/01/19 at 10:00 AM EDT by telephone and verified that I am speaking with the correct person using two identifiers.   I discussed the limitations, risks, security and privacy concerns of performing an evaluation and management service by telephone and the availability of in person appointments. I also discussed with the patient that there may be a patient responsible charge related to this service. The patient expressed understanding and agreed to proceed.  Location patient: home Location provider: work or home office Participants present for the call: patient, provider Patient did not have a visit in the prior 7 days to address this/these issue(s).   History of Present Illness: 73 year old male who  has a past medical history of Benign prostatic hypertrophy, CAD (coronary artery disease), Carotid artery occlusion, Colitis, ischemic ([place]) (06/2005), Diabetes mellitus type II, Diabetes, polyneuropathy ([place]), Diabetic peripheral neuropathy ([employer] [facility]), DVT (deep venous thrombosis) ([place]), Gout, History of myocardial infarction, History of syncope, History of tobacco abuse, Hyperlipidemia, Hypertension, Hypothyroidism, Myocardial infarction [facility]), Peripheral vascular disease ([place]), Shingles, and Thrombus (04/2008).  Presents to the office today for follow-up regarding diabetes mellitus.  In the past his  diabetes has been controlled with diet.  In November 2019 his A1c was elevated at 7.3.  He was asked to follow-up in 3 months for repeat testing but failed to do so.  His A1c has increased even further to 7.9.  He reports that his diet has not changed and he is not exercising. He does not monitor his blood sugars at home.   Observations/Objective: Patient sounds cheerful and well on the phone. I do not appreciate any SOB. Speech and thought processing are grossly intact. Patient reported vitals:  Assessment and Plan: 1. DM (diabetes  mellitus), type 2 with complications ([place]) - Will start on Metformin 500 mg daily. Advised to work on life style modifications. Reviewed side effects of medication with patient. Follow up in 90 days  - metFORMIN (GLUCOPHAGE) 500 MG tablet; Take 1 tablet (500 mg total) by mouth daily with breakfast.  Dispense: 90 tablet; Refill: 0   Follow Up Instructions:  I did not refer this patient for an OV in the next 24 hours for this/these issue(s).  I discussed the assessment and treatment plan with the patient. The patient was provided an opportunity to ask questions and all were answered. The patient agreed with the plan and demonstrated an understanding of the instructions.   The patient was advised to call back or seek an in-person evaluation if the symptoms worsen or if the condition fails to improve as anticipated.  I provided 25 minutes of non-face-to-face time during this encounter.   Dorothyann Peng, NP

## 2019-05-11 ENCOUNTER — Telehealth: Payer: Self-pay | Admitting: Family Medicine

## 2019-05-11 DIAGNOSIS — I10 Essential (primary) hypertension: Secondary | ICD-10-CM

## 2019-05-11 DIAGNOSIS — Z760 Encounter for issue of repeat prescription: Secondary | ICD-10-CM

## 2019-05-11 DIAGNOSIS — E1142 Type 2 diabetes mellitus with diabetic polyneuropathy: Secondary | ICD-10-CM

## 2019-05-11 NOTE — Telephone Encounter (Signed)
Copied from [place] [id number]. Topic: General - Inquiry >> May 11, 2019  8:41 AM Virl Axe D wrote: Reason for CRM: Pt stated his pharmacy has reached out 4 times for refills. He could not tell what medications need to be refilled. Would like to speak with Hadyn Azer. Please advise.

## 2019-05-11 NOTE — Telephone Encounter (Signed)
Tried to reach the pt.  Left a message for a return call.  I do not know what medications the patient is requesting to be filled and to what pharmacy they should go to.  CRM created.  Agent may take message.

## 2019-05-12 MED ORDER — SERTRALINE HCL 25 MG PO TABS: 25.0000 mg | ORAL_TABLET | Freq: Every day | ORAL | 1 refills | Status: DC

## 2019-05-12 MED ORDER — GABAPENTIN 100 MG PO CAPS: 100.0000 mg | ORAL_CAPSULE | Freq: Three times a day (TID) | ORAL | 1 refills | Status: DC

## 2019-05-12 MED ORDER — ISOSORBIDE MONONITRATE ER 60 MG PO TB24: 60.0000 mg | ORAL_TABLET | Freq: Every day | ORAL | 1 refills | Status: DC

## 2019-05-12 MED ORDER — ATORVASTATIN CALCIUM 80 MG PO TABS: ORAL_TABLET | ORAL | 1 refills | Status: DC

## 2019-05-12 MED ORDER — ALLOPURINOL 100 MG PO TABS: 200.0000 mg | ORAL_TABLET | Freq: Every day | ORAL | 1 refills | Status: DC

## 2019-05-12 NOTE — Telephone Encounter (Signed)
Pt returned call with request for meds. States he wants sent directly to [place]. As follows: atorvastatin (LIPITOR) 80 MG tablet   sertraline (ZOLOFT) 25 MG tablet isosorbide mononitrate (IMDUR) 60 MG 24 hr tablet carvedilol (COREG) 25 MG tablet gabapentin (NEURONTIN) 100 MG capsule allopurinol (ZYLOPRIM) 100 MG tablet  [facility] [place], [place] [phone] (Phone) [phone] (Fax)

## 2019-05-12 NOTE — Telephone Encounter (Signed)
Sent to the pharmacy by e-scribe. 

## 2019-05-12 NOTE — Addendum Note (Signed)
Addended by: Miles Costain T on: 05/12/2019 12:01 PM   Modules accepted: Orders

## 2019-05-16 ENCOUNTER — Encounter: Payer: Self-pay | Admitting: Podiatry

## 2019-05-16 ENCOUNTER — Other Ambulatory Visit: Payer: Self-pay

## 2019-05-16 ENCOUNTER — Ambulatory Visit: Payer: Medicare Other | Admitting: Podiatry

## 2019-05-16 VITALS — Temp 98.2°F

## 2019-05-16 DIAGNOSIS — L84 Corns and callosities: Secondary | ICD-10-CM

## 2019-05-16 DIAGNOSIS — M79674 Pain in right toe(s): Secondary | ICD-10-CM

## 2019-05-16 DIAGNOSIS — B351 Tinea unguium: Secondary | ICD-10-CM

## 2019-05-16 DIAGNOSIS — E1142 Type 2 diabetes mellitus with diabetic polyneuropathy: Secondary | ICD-10-CM

## 2019-05-16 DIAGNOSIS — M79675 Pain in left toe(s): Secondary | ICD-10-CM | POA: Diagnosis not present

## 2019-05-16 NOTE — Patient Instructions (Signed)
Diabetes Mellitus and Foot Care  Foot care is an important part of your health, especially when you have diabetes. Diabetes may cause you to have problems because of poor blood flow (circulation) to your feet and legs, which can cause your skin to:   Become thinner and drier.   Break more easily.   Heal more slowly.   Peel and crack.  You may also have nerve damage (neuropathy) in your legs and feet, causing decreased feeling in them. This means that you may not notice minor injuries to your feet that could lead to more serious problems. Noticing and addressing any potential problems early is the best way to prevent future foot problems.  How to care for your feet  Foot hygiene   Wash your feet daily with warm water and mild soap. Do not use hot water. Then, pat your feet and the areas between your toes until they are completely dry. Do not soak your feet as this can dry your skin.   Trim your toenails straight across. Do not dig under them or around the cuticle. File the edges of your nails with an emery board or nail file.   Apply a moisturizing lotion or petroleum jelly to the skin on your feet and to dry, brittle toenails. Use lotion that does not contain alcohol and is unscented. Do not apply lotion between your toes.  Shoes and socks   Wear clean socks or stockings every day. Make sure they are not too tight. Do not wear knee-high stockings since they may decrease blood flow to your legs.   Wear shoes that fit properly and have enough cushioning. Always look in your shoes before you put them on to be sure there are no objects inside.   To break in new shoes, wear them for just a few hours a day. This prevents injuries on your feet.  Wounds, scrapes, corns, and calluses   Check your feet daily for blisters, cuts, bruises, sores, and redness. If you cannot see the bottom of your feet, use a mirror or ask someone for help.   Do not cut corns or calluses or try to remove them with medicine.   If you  find a minor scrape, cut, or break in the skin on your feet, keep it and the skin around it clean and dry. You may clean these areas with mild soap and water. Do not clean the area with peroxide, alcohol, or iodine.   If you have a wound, scrape, corn, or callus on your foot, look at it several times a day to make sure it is healing and not infected. Check for:  ? Redness, swelling, or pain.  ? Fluid or blood.  ? Warmth.  ? Pus or a bad smell.  General instructions   Do not cross your legs. This may decrease blood flow to your feet.   Do not use heating pads or hot water bottles on your feet. They may burn your skin. If you have lost feeling in your feet or legs, you may not know this is happening until it is too late.   Protect your feet from hot and cold by wearing shoes, such as at the beach or on hot pavement.   Schedule a complete foot exam at least once a year (annually) or more often if you have foot problems. If you have foot problems, report any cuts, sores, or bruises to your health care provider immediately.  Contact a health care provider if:     You have a medical condition that increases your risk of infection and you have any cuts, sores, or bruises on your feet.   You have an injury that is not healing.   You have redness on your legs or feet.   You feel burning or tingling in your legs or feet.   You have pain or cramps in your legs and feet.   Your legs or feet are numb.   Your feet always feel cold.   You have pain around a toenail.  Get help right away if:   You have a wound, scrape, corn, or callus on your foot and:  ? You have pain, swelling, or redness that gets worse.  ? You have fluid or blood coming from the wound, scrape, corn, or callus.  ? Your wound, scrape, corn, or callus feels warm to the touch.  ? You have pus or a bad smell coming from the wound, scrape, corn, or callus.  ? You have a fever.  ? You have a red line going up your leg.  Summary   Check your feet every day  for cuts, sores, red spots, swelling, and blisters.   Moisturize feet and legs daily.   Wear shoes that fit properly and have enough cushioning.   If you have foot problems, report any cuts, sores, or bruises to your health care provider immediately.   Schedule a complete foot exam at least once a year (annually) or more often if you have foot problems.  This information is not intended to replace advice given to you by your health care provider. Make sure you discuss any questions you have with your health care provider.  Document Released: 11/07/2000 Document Revised: 12/23/2017 Document Reviewed: 12/12/2016  Elsevier Interactive Patient Education  2019 [employer]

## 2019-05-24 NOTE — Progress Notes (Signed)
Subjective: Fernando Combs presents today for preventative diabetic foot care with history of diabetic neuropathy. Patient seen for follow up of chronic, painful mycotic toenails and calluses which interfere with daily activities and routine tasks.  Pain is aggravated when wearing enclosed shoe gear. Pain is getting progressively worse and relieved with periodic professional debridement.   Dorothyann Peng, NP is his PCP. Last visit was 04/01/2019.   Current Outpatient Medications:  .  allopurinol (ZYLOPRIM) 100 MG tablet, Take 2 tablets (200 mg total) by mouth daily., Disp: 180 tablet, Rfl: 1 .  amLODipine (NORVASC) 5 MG tablet, TAKE 1 TABLET BY MOUTH  DAILY, Disp: 90 tablet, Rfl: 3 .  aspirin 81 MG chewable tablet, Chew 1 tablet (81 mg total) by mouth daily., Disp: , Rfl:  .  atorvastatin (LIPITOR) 80 MG tablet, TAKE 1 TABLET BY MOUTH  DAILY AT 6 P.M., Disp: 90 tablet, Rfl: 1 .  carvedilol (COREG) 25 MG tablet, TAKE 1 TABLET BY MOUTH TWO  TIMES DAILY WITH  MEAL, Disp: 180 tablet, Rfl: 3 .  Cholecalciferol (VITAMIN D3) 125 MCG (5000 UT) TABS, Take by mouth., Disp: , Rfl:  .  FLUAD 0.5 ML SUSY, ADM 0.5ML IM UTD, Disp: , Rfl: 0 .  gabapentin (NEURONTIN) 100 MG capsule, Take 1 capsule (100 mg total) by mouth 3 (three) times daily., Disp: 270 capsule, Rfl: 1 .  isosorbide mononitrate (IMDUR) 60 MG 24 hr tablet, Take 1 tablet (60 mg total) by mouth daily., Disp: 90 tablet, Rfl: 1 .  metFORMIN (GLUCOPHAGE) 500 MG tablet, Take 1 tablet (500 mg total) by mouth daily with breakfast., Disp: 90 tablet, Rfl: 0 .  nitroGLYCERIN (NITROSTAT) 0.4 MG SL tablet, DISSOLVE 1 TABLET UNDER THE TONGUE EVERY 5 MINUTES AS  NEEDED FOR CHEST PAIN FOR  UP TO 3 DOSES. SEEK MEDICAL ATTENTION IF NO RELIEF., Disp: 90 tablet, Rfl: 3 .  Omega-3 Fatty Acids (FISH OIL) 1000 MG CPDR, Take by mouth., Disp: , Rfl:  .  sertraline (ZOLOFT) 25 MG tablet, Take 1 tablet (25 mg total) by mouth daily., Disp: 90 tablet, Rfl: 1 .  tamsulosin  (FLOMAX) 0.4 MG CAPS capsule, TAKE 1 CAPSULE BY MOUTH  DAILY, Disp: 90 capsule, Rfl: 3 .  levothyroxine (SYNTHROID, LEVOTHROID) 100 MCG tablet, Take 1 tablet (100 mcg total) by mouth daily. PLEASE FILL FOR 1 YEAR.  THANKS, Disp: 90 tablet, Rfl: 3  No Known Allergies  Objective: Vitals:   05/16/19 1322  Temp: 98.2 F (36.8 C)    Vascular Examination: Capillary refill time immediate x 10 digits.  Dorsalis pedis pulses 1/4 b/l.  Posterior tibial pulses 1/4 b/l.  No digital hair x 10 digits.  Skin temperature WNL b/l.  Dermatological Examination: Skin with normal turgor, texture and tone b/l.  Toenails 1-5 b/l discolored, thick, dystrophic with subungual debris and pain with palpation to nailbeds due to thickness of nails.  Hyperkeratotic lesion(s) submet heads 1, 4, 5 right and submet head 2, 5 left foot. No erythema, no edema, no drainage, no flocculence noted.   Musculoskeletal: Muscle strength 5/5 to all LE muscle groups.  Neurological: Sensation absent with 10 gram monofilament.  Vibratory sensation absent b/l.  Assessment: 1. Painful onychomycosis toenails 1-5 b/l 2. Calluses submet heads 1, 4, 5 right and submet head 2, 5 left foot.  3. NIDDM with neuropathy   Plan: 1. Toenails 1-5 b/l were debrided in length and girth without iatrogenic bleeding. 2. Calluses pared submet heads 1, 4, 5 right and submet head  2, 5 left foot utilizing sterile scalpel blade without incident. 3. Patient to continue soft, supportive shoe gear daily. 4. Patient to report any pedal injuries to medical professional immediately. 5. Follow up 10 weeks.  6. [name] to call should there be a concern in the interim.

## 2019-06-22 ENCOUNTER — Other Ambulatory Visit: Payer: Self-pay | Admitting: Family Medicine

## 2019-06-22 DIAGNOSIS — E118 Type 2 diabetes mellitus with unspecified complications: Secondary | ICD-10-CM

## 2019-06-23 ENCOUNTER — Other Ambulatory Visit: Payer: Self-pay | Admitting: Adult Health

## 2019-06-23 DIAGNOSIS — E118 Type 2 diabetes mellitus with unspecified complications: Secondary | ICD-10-CM

## 2019-06-23 NOTE — Telephone Encounter (Signed)
Has upcoming appointment

## 2019-06-27 ENCOUNTER — Other Ambulatory Visit: Payer: Self-pay

## 2019-06-27 ENCOUNTER — Other Ambulatory Visit (INDEPENDENT_AMBULATORY_CARE_PROVIDER_SITE_OTHER): Payer: Medicare Other

## 2019-06-27 DIAGNOSIS — E118 Type 2 diabetes mellitus with unspecified complications: Secondary | ICD-10-CM | POA: Diagnosis not present

## 2019-06-27 LAB — HEMOGLOBIN A1C: Hgb A1c MFr Bld: 7.5 % — ABNORMAL HIGH (ref 4.6–6.5)

## 2019-06-29 ENCOUNTER — Other Ambulatory Visit: Payer: Self-pay

## 2019-06-29 ENCOUNTER — Telehealth (INDEPENDENT_AMBULATORY_CARE_PROVIDER_SITE_OTHER): Payer: Medicare Other | Admitting: Adult Health

## 2019-06-29 DIAGNOSIS — E118 Type 2 diabetes mellitus with unspecified complications: Secondary | ICD-10-CM

## 2019-06-29 MED ORDER — METFORMIN HCL 500 MG PO TABS: 500.0000 mg | ORAL_TABLET | Freq: Two times a day (BID) | ORAL | 0 refills | Status: DC

## 2019-06-29 NOTE — Progress Notes (Signed)
Virtual Visit via Telephone Note  I connected with Fernando Combs on 06/29/19 at  8:00 AM EDT by telephone and verified that I am speaking with the correct person using two identifiers.   I discussed the limitations, risks, security and privacy concerns of performing an evaluation and management service by telephone and the availability of in person appointments. I also discussed with the patient that there may be a patient responsible charge related to this service. The patient expressed understanding and agreed to proceed.  Location patient: home Location provider: work or home office Participants present for the call: patient, provider Patient did not have a visit in the prior 7 days to address this/these issue(s).   History of Present Illness: 73 year old male who is being evaluated today for 65-month follow-up regarding diabetes mellitus.  Currently prescribed metformin 500 mg daily.  In the past his diabetes has been diet controlled but over the last year his A1c was increasing he had to put him on metformin.  His last A1c in May 2020 was 7.9.  Does report that he is unable to exercise as much as he would like due to an diabetic neuropathy pain.  He is currently prescribed gabapentin 300 mg 3 times daily and does not get much relief from this.  He is also seen by a podiatrist on a routine basis  Today his A1c is 7.5     Observations/Objective: Patient sounds cheerful and well on the phone. I do not appreciate any SOB. Speech and thought processing are grossly intact. Patient reported vitals:  Assessment and Plan: 1. DM (diabetes mellitus), type 2 with complications ([place]) -We will increase metformin from daily to twice daily.  Next due for follow-up in November at which time we will do his physical.  Encouraged exercise as much as possible and make good dietary choices. - metFORMIN (GLUCOPHAGE) 500 MG tablet; Take 1 tablet (500 mg total) by mouth 2 (two) times daily with a meal.   Dispense: 180 tablet; Refill: 0   Follow Up Instructions:  I did not refer this patient for an OV in the next 24 hours for this/these issue(s).  I discussed the assessment and treatment plan with the patient. The patient was provided an opportunity to ask questions and all were answered. The patient agreed with the plan and demonstrated an understanding of the instructions.   The patient was advised to call back or seek an in-person evaluation if the symptoms worsen or if the condition fails to improve as anticipated.  I provided 15 minutes of non-face-to-face time during this encounter.   Dorothyann Peng, NP

## 2019-07-25 ENCOUNTER — Ambulatory Visit: Payer: Medicare Other | Admitting: Podiatry

## 2019-09-02 ENCOUNTER — Other Ambulatory Visit: Payer: Self-pay | Admitting: Adult Health

## 2019-09-02 DIAGNOSIS — E038 Other specified hypothyroidism: Secondary | ICD-10-CM

## 2019-09-02 DIAGNOSIS — I10 Essential (primary) hypertension: Secondary | ICD-10-CM

## 2019-09-02 DIAGNOSIS — Z760 Encounter for issue of repeat prescription: Secondary | ICD-10-CM

## 2019-09-02 DIAGNOSIS — N400 Enlarged prostate without lower urinary tract symptoms: Secondary | ICD-10-CM

## 2019-09-02 NOTE — Telephone Encounter (Signed)
Sertraline, isosorbide, atorvastain were sent to the pharmacy for 6 months on 05/12/2019.  Levothyroxine sent on 10/29/18 for 1 year. All others sent to the pharmacy for 90 days.

## 2019-09-05 ENCOUNTER — Ambulatory Visit: Payer: Medicare Other

## 2019-09-07 ENCOUNTER — Ambulatory Visit (INDEPENDENT_AMBULATORY_CARE_PROVIDER_SITE_OTHER): Payer: Medicare Other | Admitting: Podiatry

## 2019-09-07 ENCOUNTER — Encounter: Payer: Self-pay | Admitting: Podiatry

## 2019-09-07 ENCOUNTER — Other Ambulatory Visit: Payer: Self-pay

## 2019-09-07 DIAGNOSIS — B351 Tinea unguium: Secondary | ICD-10-CM | POA: Diagnosis not present

## 2019-09-07 DIAGNOSIS — M79675 Pain in left toe(s): Secondary | ICD-10-CM

## 2019-09-07 DIAGNOSIS — L84 Corns and callosities: Secondary | ICD-10-CM | POA: Diagnosis not present

## 2019-09-07 DIAGNOSIS — E1142 Type 2 diabetes mellitus with diabetic polyneuropathy: Secondary | ICD-10-CM | POA: Diagnosis not present

## 2019-09-07 DIAGNOSIS — M79674 Pain in right toe(s): Secondary | ICD-10-CM

## 2019-09-07 NOTE — Patient Instructions (Signed)
Diabetes Mellitus and Foot Care Foot care is an important part of your health, especially when you have diabetes. Diabetes may cause you to have problems because of poor blood flow (circulation) to your feet and legs, which can cause your skin to:  Become thinner and drier.  Break more easily.  Heal more slowly.  Peel and crack. You may also have nerve damage (neuropathy) in your legs and feet, causing decreased feeling in them. This means that you may not notice minor injuries to your feet that could lead to more serious problems. Noticing and addressing any potential problems early is the best way to prevent future foot problems. How to care for your feet Foot hygiene  Wash your feet daily with warm water and mild soap. Do not use hot water. Then, pat your feet and the areas between your toes until they are completely dry. Do not soak your feet as this can dry your skin.  Trim your toenails straight across. Do not dig under them or around the cuticle. File the edges of your nails with an emery board or nail file.  Apply a moisturizing lotion or petroleum jelly to the skin on your feet and to dry, brittle toenails. Use lotion that does not contain alcohol and is unscented. Do not apply lotion between your toes. Shoes and socks  Wear clean socks or stockings every day. Make sure they are not too tight. Do not wear knee-high stockings since they may decrease blood flow to your legs.  Wear shoes that fit properly and have enough cushioning. Always look in your shoes before you put them on to be sure there are no objects inside.  To break in new shoes, wear them for just a few hours a day. This prevents injuries on your feet. Wounds, scrapes, corns, and calluses  Check your feet daily for blisters, cuts, bruises, sores, and redness. If you cannot see the bottom of your feet, use a mirror or ask someone for help.  Do not cut corns or calluses or try to remove them with medicine.  If you  find a minor scrape, cut, or break in the skin on your feet, keep it and the skin around it clean and dry. You may clean these areas with mild soap and water. Do not clean the area with peroxide, alcohol, or iodine.  If you have a wound, scrape, corn, or callus on your foot, look at it several times a day to make sure it is healing and not infected. Check for: ? Redness, swelling, or pain. ? Fluid or blood. ? Warmth. ? Pus or a bad smell. General instructions  Do not cross your legs. This may decrease blood flow to your feet.  Do not use heating pads or hot water bottles on your feet. They may burn your skin. If you have lost feeling in your feet or legs, you may not know this is happening until it is too late.  Protect your feet from hot and cold by wearing shoes, such as at the beach or on hot pavement.  Schedule a complete foot exam at least once a year (annually) or more often if you have foot problems. If you have foot problems, report any cuts, sores, or bruises to your health care provider immediately. Contact a health care provider if:  You have a medical condition that increases your risk of infection and you have any cuts, sores, or bruises on your feet.  You have an injury that is not   healing.  You have redness on your legs or feet.  You feel burning or tingling in your legs or feet.  You have pain or cramps in your legs and feet.  Your legs or feet are numb.  Your feet always feel cold.  You have pain around a toenail. Get help right away if:  You have a wound, scrape, corn, or callus on your foot and: ? You have pain, swelling, or redness that gets worse. ? You have fluid or blood coming from the wound, scrape, corn, or callus. ? Your wound, scrape, corn, or callus feels warm to the touch. ? You have pus or a bad smell coming from the wound, scrape, corn, or callus. ? You have a fever. ? You have a red line going up your leg. Summary  Check your feet every day  for cuts, sores, red spots, swelling, and blisters.  Moisturize feet and legs daily.  Wear shoes that fit properly and have enough cushioning.  If you have foot problems, report any cuts, sores, or bruises to your health care provider immediately.  Schedule a complete foot exam at least once a year (annually) or more often if you have foot problems. This information is not intended to replace advice given to you by your health care provider. Make sure you discuss any questions you have with your health care provider. Document Released: 11/07/2000 Document Revised: 12/23/2017 Document Reviewed: 12/12/2016 Elsevier Patient Education  2020 [employer] Inc[employer]  

## 2019-09-11 NOTE — Progress Notes (Signed)
Subjective: Fernando Combs is a 73 y.o. y.o. male who presents today for preventative diabetic foot care. He presents with calluses b/l and painful, mycotic toenails b/l which interfere with daily activities. Pain is aggravated when wearing enclosed shoe gear and relieved with periodic professional debridement.  Dorothyann Peng, NP is his PCP   Current Outpatient Medications on File Prior to Visit  Medication Sig Dispense Refill  . allopurinol (ZYLOPRIM) 100 MG tablet Take 2 tablets (200 mg total) by mouth daily. 180 tablet 1  . amLODipine (NORVASC) 5 MG tablet TAKE 1 TABLET BY MOUTH  DAILY 90 tablet 0  . aspirin 81 MG chewable tablet Chew 1 tablet (81 mg total) by mouth daily.    Marland Kitchen atorvastatin (LIPITOR) 80 MG tablet TAKE 1 TABLET BY MOUTH  DAILY AT 6 P.M. 90 tablet 1  . carvedilol (COREG) 25 MG tablet TAKE 1 TABLET BY MOUTH TWO  TIMES DAILY WITH  MEAL 180 tablet 0  . Cholecalciferol (VITAMIN D3) 125 MCG (5000 UT) TABS Take by mouth.    Marland Kitchen FLUAD 0.5 ML SUSY ADM 0.5ML IM UTD  0  . gabapentin (NEURONTIN) 100 MG capsule Take 1 capsule (100 mg total) by mouth 3 (three) times daily. 270 capsule 1  . isosorbide mononitrate (IMDUR) 60 MG 24 hr tablet Take 1 tablet (60 mg total) by mouth daily. 90 tablet 1  . levothyroxine (SYNTHROID, LEVOTHROID) 100 MCG tablet Take 1 tablet (100 mcg total) by mouth daily. PLEASE FILL FOR 1 YEAR.  THANKS 90 tablet 3  . metFORMIN (GLUCOPHAGE) 500 MG tablet Take 1 tablet (500 mg total) by mouth 2 (two) times daily with a meal. 180 tablet 0  . nitroGLYCERIN (NITROSTAT) 0.4 MG SL tablet DISSOLVE 1 TABLET UNDER THE TONGUE EVERY 5 MINUTES AS  NEEDED FOR CHEST PAIN FOR  UP TO 3 DOSES. SEEK MEDICAL ATTENTION IF NO RELIEF. 90 tablet 3  . Omega-3 Fatty Acids (FISH OIL) 1000 MG CPDR Take by mouth.    . sertraline (ZOLOFT) 25 MG tablet Take 1 tablet (25 mg total) by mouth daily. 90 tablet 1  . tamsulosin (FLOMAX) 0.4 MG CAPS capsule TAKE 1 CAPSULE BY MOUTH  DAILY 90 capsule 0    No current facility-administered medications on file prior to visit.     No Known Allergies  Objective: There were no vitals filed for this visit.  Vascular Examination: Capillary refill time immediate x 10 digits.  Dorsalis pedis and Posterior tibial pulses 1/4 b/l.  Digital hair absent b/l.  Skin temperature gradient WNL b/l.  Dermatological Examination: Skin with normal turgor, texture and tone b/l.  Toenails 1-5 b/l discolored, thick, dystrophic with subungual debris and pain with palpation to nailbeds due to thickness of nails.  Hyperkeratotic lesions submet heads 1, 4, 5 right and submet heads 2, 5 left foot. No erythema, no edema, no drainage, no flocculence noted.  Musculoskeletal: Muscle strength 5/5 to all LE muscle groups b/l.  Neurological: Sensation absent b/l with 10 gram monofilament.   Assessment: 1. Painful onychomycosis toenails 1-5 b/l 2.  Calluses submet heads 1, 4, 5 right and submet heads 2, 5 left foot 3.  NIDDM with neuropathy  Plan: 1. Continue diabetic foot care principles. Literature dispensed on today. 2. Toenails 1-5 b/l were debrided in length and girth without iatrogenic bleeding. 3. Hyperkeratotic lesions submet heads 1, 4, 5 right and submet heads 2, 5 left foot pared with sterile scalpel blade without incident. 4. Patient to continue soft, supportive shoe gear  daily. 5. Patient to report any pedal injuries to medical professional immediately. 6. Follow up 3 months.  7. [name] to call should there be a concern in the interim.

## 2019-09-19 ENCOUNTER — Other Ambulatory Visit: Payer: Self-pay | Admitting: Adult Health

## 2019-09-19 DIAGNOSIS — E118 Type 2 diabetes mellitus with unspecified complications: Secondary | ICD-10-CM

## 2019-09-21 NOTE — Telephone Encounter (Signed)
Sent to the pharmacy by e-scribe for 90 days.  Has upcoming cpx. 

## 2019-09-28 ENCOUNTER — Other Ambulatory Visit: Payer: Self-pay | Admitting: Adult Health

## 2019-09-28 DIAGNOSIS — Z760 Encounter for issue of repeat prescription: Secondary | ICD-10-CM

## 2019-09-29 NOTE — Telephone Encounter (Signed)
CPX scheduled on 10/12/2019.  Last filled for 6 months in June.  Request is early.

## 2019-09-30 ENCOUNTER — Other Ambulatory Visit: Payer: Self-pay | Admitting: Cardiology

## 2019-09-30 ENCOUNTER — Other Ambulatory Visit: Payer: Self-pay | Admitting: Adult Health

## 2019-09-30 DIAGNOSIS — E038 Other specified hypothyroidism: Secondary | ICD-10-CM

## 2019-09-30 DIAGNOSIS — Z760 Encounter for issue of repeat prescription: Secondary | ICD-10-CM

## 2019-09-30 DIAGNOSIS — I10 Essential (primary) hypertension: Secondary | ICD-10-CM

## 2019-10-04 NOTE — Telephone Encounter (Signed)
Pt has upcoming cpx on 10/13/2019.  Should have enough medication to get to appointment.

## 2019-10-12 ENCOUNTER — Other Ambulatory Visit: Payer: Self-pay

## 2019-10-13 ENCOUNTER — Ambulatory Visit (INDEPENDENT_AMBULATORY_CARE_PROVIDER_SITE_OTHER): Payer: Medicare Other | Admitting: Adult Health

## 2019-10-13 ENCOUNTER — Encounter: Payer: Self-pay | Admitting: Adult Health

## 2019-10-13 VITALS — BP 130/64 | Temp 98.3°F | Ht 69.5 in | Wt 231.0 lb

## 2019-10-13 DIAGNOSIS — Z1211 Encounter for screening for malignant neoplasm of colon: Secondary | ICD-10-CM

## 2019-10-13 DIAGNOSIS — M109 Gout, unspecified: Secondary | ICD-10-CM

## 2019-10-13 DIAGNOSIS — Z0000 Encounter for general adult medical examination without abnormal findings: Secondary | ICD-10-CM | POA: Diagnosis not present

## 2019-10-13 DIAGNOSIS — N400 Enlarged prostate without lower urinary tract symptoms: Secondary | ICD-10-CM

## 2019-10-13 DIAGNOSIS — E038 Other specified hypothyroidism: Secondary | ICD-10-CM

## 2019-10-13 DIAGNOSIS — F339 Major depressive disorder, recurrent, unspecified: Secondary | ICD-10-CM

## 2019-10-13 DIAGNOSIS — I10 Essential (primary) hypertension: Secondary | ICD-10-CM

## 2019-10-13 DIAGNOSIS — E118 Type 2 diabetes mellitus with unspecified complications: Secondary | ICD-10-CM | POA: Diagnosis not present

## 2019-10-13 DIAGNOSIS — I2510 Atherosclerotic heart disease of native coronary artery without angina pectoris: Secondary | ICD-10-CM

## 2019-10-13 LAB — COMPREHENSIVE METABOLIC PANEL
ALT: 24 U/L (ref 0–53)
AST: 20 U/L (ref 0–37)
Albumin: 4.4 g/dL (ref 3.5–5.2)
Alkaline Phosphatase: 74 U/L (ref 39–117)
BUN: 25 mg/dL — ABNORMAL HIGH (ref 6–23)
CO2: 26 mEq/L (ref 19–32)
Calcium: 9.6 mg/dL (ref 8.4–10.5)
Chloride: 103 mEq/L (ref 96–112)
Creatinine, Ser: 1.9 mg/dL — ABNORMAL HIGH (ref 0.40–1.50)
GFR: 34.8 mL/min — ABNORMAL LOW (ref >60.00)
Glucose, Bld: 206 mg/dL — ABNORMAL HIGH (ref 70–99)
Potassium: 4.4 mEq/L (ref 3.5–5.1)
Sodium: 140 mEq/L (ref 135–145)
Total Bilirubin: 0.4 mg/dL (ref 0.2–1.2)
Total Protein: 7.2 g/dL (ref 6.0–8.3)

## 2019-10-13 LAB — LIPID PANEL
Cholesterol: 106 mg/dL (ref 0–200)
HDL: 36.1 mg/dL — ABNORMAL LOW (ref >39.00)
NonHDL: 69.43
Total CHOL/HDL Ratio: 3
Triglycerides: 250 mg/dL — ABNORMAL HIGH (ref 0.0–149.0)
VLDL: 50 mg/dL — ABNORMAL HIGH (ref 0.0–40.0)

## 2019-10-13 LAB — CBC WITH DIFFERENTIAL/PLATELET
Basophils Absolute: 0.1 10*3/uL (ref 0.0–0.1)
Basophils Relative: 1 % (ref 0.0–3.0)
Eosinophils Absolute: 0.4 10*3/uL (ref 0.0–0.7)
Eosinophils Relative: 4 % (ref 0.0–5.0)
HCT: 39.9 % (ref 39.0–52.0)
Hemoglobin: 13.6 g/dL (ref 13.0–17.0)
Lymphocytes Relative: 20.5 % (ref 12.0–46.0)
Lymphs Abs: 2 10*3/uL (ref 0.7–4.0)
MCHC: 34 g/dL (ref 30.0–36.0)
MCV: 88.8 fl (ref 78.0–100.0)
Monocytes Absolute: 0.7 10*3/uL (ref 0.1–1.0)
Monocytes Relative: 6.7 % (ref 3.0–12.0)
Neutro Abs: 6.7 10*3/uL (ref 1.4–7.7)
Neutrophils Relative %: 67.8 % (ref 43.0–77.0)
Platelets: 257 10*3/uL (ref 150.0–400.0)
RBC: 4.49 Mil/uL (ref 4.22–5.81)
RDW: 15.7 % — ABNORMAL HIGH (ref 11.5–15.5)
WBC: 9.9 10*3/uL (ref 4.0–10.5)

## 2019-10-13 LAB — HEMOGLOBIN A1C: Hgb A1c MFr Bld: 7.2 % — ABNORMAL HIGH (ref 4.6–6.5)

## 2019-10-13 LAB — PSA: PSA: 0.59 ng/mL (ref 0.10–4.00)

## 2019-10-13 LAB — TSH: TSH: 4.88 u[IU]/mL — ABNORMAL HIGH (ref 0.35–4.50)

## 2019-10-13 LAB — LDL CHOLESTEROL, DIRECT: Direct LDL: 37 mg/dL

## 2019-10-13 NOTE — Progress Notes (Signed)
Subjective:    Patient ID: Fernando Combs, male    DOB: 1946/07/28, 73 y.o.   MRN: [id number]  HPI Patient presents for yearly preventative medicine examination. He is a pleasant 73 year old male who  has a past medical history of Benign prostatic hypertrophy, CAD (coronary artery disease), Carotid artery occlusion, Colitis, ischemic ([place]) (06/2005), Diabetes mellitus type II, Diabetes, polyneuropathy ([place]), Diabetic peripheral neuropathy ([place]), DVT (deep venous thrombosis) ([place]), Gout, History of myocardial infarction, History of syncope, History of tobacco abuse, Hyperlipidemia, Hypertension, Hypothyroidism, Myocardial infarction [facility]), Peripheral vascular disease ([place]), Shingles, and Thrombus (04/2008).  DM - currently takes Metformin 500 mg BID. He has been working on diet. Denies symptoms of hypoglcemia Lab Results  Component Value Date   HGBA1C 7.5 (H) 06/27/2019   BPH - takes flomax 0.4 mg. Denies symptoms   Hypertension - Takes Norvasc 5 mg and Coreg 25 mg daily. He denies lightheadedness, dizziness, chest pain or shortness of breath  BP Readings from Last 3 Encounters:  10/13/19 130/64  03/21/19 124/68  10/27/18 106/60   CAD - is managed by Cardiology and Vascular. He has a history of multiple stents. His last cardiac cath was in 2016. He denies exertional chest pain or SOB. He is prescribed ASA, Lipitor, Carvedilol, Plavix, and Imdur.   Hypothyroidism - takes synthroid 100 mcg daily.   Depression - Well controlled on Zoloft   H/o Gout - takes allopurinol 100 mg daily. Denies any recent flares.   All immunizations and health maintenance protocols were reviewed with the patient and needed orders were placed. He is up to date on routine vaccinations   Appropriate screening laboratory values were ordered for the patient including screening of hyperlipidemia, renal function and hepatic function. If indicated by BPH, a PSA was ordered.  Medication reconciliation,  past medical  history, social history, problem list and allergies were reviewed in detail with the patient  Goals were established with regard to weight loss, exercise, and  diet in compliance with medications Wt Readings from Last 3 Encounters:  10/13/19 231 lb (104.8 kg)  03/21/19 220 lb (99.8 kg)  10/27/18 235 lb 8 oz (106.8 kg)   End of life planning was discussed.  He is do for a colonoscopy  He denies any acute issues - states " I feel pretty good"   Review of Systems  Constitutional: Positive for fatigue (chronic).  HENT: Negative.   Eyes: Negative.   Respiratory: Negative.   Cardiovascular: Negative.   Gastrointestinal: Positive for constipation (intermittent).  Endocrine: Negative.   Genitourinary: Negative.   Musculoskeletal: Negative.   Skin: Negative.   Allergic/Immunologic: Negative.   Neurological: Negative.   Hematological: Negative.   Psychiatric/Behavioral: Negative.   All other systems reviewed and are negative.  Past Medical History:  Diagnosis Date  . Benign prostatic hypertrophy   . CAD (coronary artery disease)    cath 05/16/2015 patent LIMA to LAD, SVG to OM, SVG to RPDA, SVG to diagonal was occluded. PTCA 95% prox LAD reduce down to 60%. Medical management  . Carotid artery occlusion   . Colitis, ischemic ([place]) 06/2005    history of  . Diabetes mellitus type II   . Diabetes, polyneuropathy ([place])   . Diabetic peripheral neuropathy ([place])   . DVT (deep venous thrombosis) ([place])   . Gout   . History of myocardial infarction   . History of syncope    most likely vasovagal  . History of tobacco abuse   . Hyperlipidemia   .  Hypertension   . Hypothyroidism   . Myocardial infarction ([place])   . Peripheral vascular disease ([place])   . Shingles   . Thrombus 04/2008   acute thrombus of his right superficial artery with claudication    Social History   Socioeconomic History  . Marital status: Married    Spouse name: Not on file  . Number of children: Not on file   . Years of education: Not on file  . Highest education level: Not on file  Occupational History  . Not on file  Social Needs  . Financial resource strain: Not on file  . Food insecurity    Worry: Not on file    Inability: Not on file  . Transportation needs    Medical: Not on file    Non-medical: Not on file  Tobacco Use  . Smoking status: Former Smoker    Packs/day: 1.00    Years: 50.00    Pack years: 50.00    Types: Cigarettes    Quit date: 04/14/2011    Years since quitting: 8.5  . Smokeless tobacco: Never Used  Substance and Sexual Activity  . Alcohol use: No  . Drug use: No  . Sexual activity: Not on file  Lifestyle  . Physical activity    Days per week: Not on file    Minutes per session: Not on file  . Stress: Not on file  Relationships  . Social Herbalist on phone: Not on file    Gets together: Not on file    Attends religious service: Not on file    Active member of club or organization: Not on file    Attends meetings of clubs or organizations: Not on file    Relationship status: Not on file  . Intimate partner violence    Fear of current or ex partner: Not on file    Emotionally abused: Not on file    Physically abused: Not on file    Forced sexual activity: Not on file  Other Topics Concern  . Not on file  Social History Narrative   Occupation:  Retired from [employer]    Married - lives with wife (2nd marriage)   3 children     Alcohol use-no   Tobacco Use - Quit smoking 3 years ago           Past Surgical History:  Procedure Laterality Date  . CARDIAC CATHETERIZATION N/A 05/15/2015   Procedure: Left Heart Cath and Cors/Grafts Angiography;  Surgeon: Troy Sine, MD;  Location: [place] CV LAB;  Service: Cardiovascular;  Laterality: N/A;  . CATARACT EXTRACTION Bilateral   . CORONARY ARTERY BYPASS GRAFT  08/04/00   times 4 (left interanl mammary artery to the left anterior descending, saphenous vein,graft to diagonal,  saphenous vein graft to obtuse marginal, spahenous vein graft to posterior descending-Surgeon  Tharon Aquas Tright III  . LOWER EXTREMITY ANGIOGRAM  March 09, 2012  . LOWER EXTREMITY ANGIOGRAM Right 03/09/2012   Procedure: LOWER EXTREMITY ANGIOGRAM;  Surgeon: Serafina Mitchell, MD;  Location: [facility];  Service: Cardiovascular;  Laterality: Right;  . OTHER SURGICAL HISTORY  11/2010   angiogram, right leg stent placement-  -Dr Kellie Simmering  . peripheral vascular surgery    . PR VEIN BYPASS GRAFT,AORTO-FEM-POP  08/04/00    Family History  Problem Relation Age of Onset  . Deep vein thrombosis Mother        Varicose Veins  . Heart disease Mother   .  Hyperlipidemia Mother   . Hypertension Mother   . Peripheral vascular disease Mother   . Lung cancer Mother   . Stroke Father   . Deep vein thrombosis Father   . Diabetes Father   . Heart disease Father        Heart Disease  before age 84  . Hyperlipidemia Father   . Hypertension Father   . Heart attack Father   . Peripheral vascular disease Father   . Alcohol abuse Unknown   . Stroke Unknown   . Coronary artery disease Unknown   . Hyperlipidemia Unknown   . Deep vein thrombosis Brother   . Diabetes Brother   . Heart disease Brother        Heart Disease before age 45  . Hyperlipidemia Brother   . Hypertension Brother   . Heart attack Brother   . Peripheral vascular disease Brother   . Lung cancer Sister   . Kidney cancer Brother        on hospice   . Other Neg Hx        denies family history of blood clots or blood disorder    No Known Allergies  Current Outpatient Medications on File Prior to Visit  Medication Sig Dispense Refill  . amLODipine (NORVASC) 5 MG tablet TAKE 1 TABLET BY MOUTH  DAILY 90 tablet 0  . aspirin 81 MG chewable tablet Chew 1 tablet (81 mg total) by mouth daily.    . carvedilol (COREG) 25 MG tablet TAKE 1 TABLET BY MOUTH TWO  TIMES DAILY WITH  MEAL 180 tablet 0  . Cholecalciferol (VITAMIN D3) 125 MCG (5000 UT)  TABS Take by mouth.    Marland Kitchen FLUAD 0.5 ML SUSY ADM 0.5ML IM UTD  0  . gabapentin (NEURONTIN) 100 MG capsule Take 1 capsule (100 mg total) by mouth 3 (three) times daily. 270 capsule 1  . metFORMIN (GLUCOPHAGE) 500 MG tablet TAKE 1 TABLET BY MOUTH 2 TIMES DAILY WITH A MEAL. 180 tablet 0  . nitroGLYCERIN (NITROSTAT) 0.4 MG SL tablet DISSOLVE 1 TABLET UNDER THE TONGUE EVERY 5 MINUTES AS  NEEDED FOR CHEST PAIN FOR  UP TO 3 DOSES. SEEK MEDICAL ATTENTION IF NO RELIEF. 90 tablet 3  . Omega-3 Fatty Acids (FISH OIL) 1000 MG CPDR Take by mouth.    . tamsulosin (FLOMAX) 0.4 MG CAPS capsule TAKE 1 CAPSULE BY MOUTH  DAILY 90 capsule 0  . allopurinol (ZYLOPRIM) 100 MG tablet TAKE 2 TABLETS BY MOUTH  DAILY 180 tablet 3  . atorvastatin (LIPITOR) 80 MG tablet TAKE 1 TABLET BY MOUTH  DAILY AT 6 P.M. 90 tablet 3  . isosorbide mononitrate (IMDUR) 60 MG 24 hr tablet TAKE 1 TABLET BY MOUTH  DAILY 90 tablet 3  . levothyroxine (SYNTHROID) 100 MCG tablet TAKE 1 TABLET BY MOUTH  DAILY 90 tablet 3  . sertraline (ZOLOFT) 25 MG tablet TAKE 1 TABLET BY MOUTH  DAILY 90 tablet 3   No current facility-administered medications on file prior to visit.     BP 130/64   Temp 98.3 F (36.8 C) (Temporal)   Ht 5' 9.5" (1.765 m)   Wt 231 lb (104.8 kg)   BMI 33.62 kg/m       Objective:   Physical Exam Vitals signs and nursing note reviewed.  Constitutional:      General: He is not in acute distress.    Appearance: Normal appearance. He is obese. He is not ill-appearing, toxic-appearing or diaphoretic.  HENT:  Head: Normocephalic and atraumatic.     Right Ear: Tympanic membrane, ear canal and external ear normal. There is no impacted cerumen.     Left Ear: Tympanic membrane, ear canal and external ear normal. There is no impacted cerumen.     Nose: Nose normal. No congestion or rhinorrhea.     Mouth/Throat:     Mouth: Mucous membranes are moist.     Dentition: Has dentures.     Pharynx: Oropharynx is clear. No  oropharyngeal exudate.  Eyes:     General: No scleral icterus.       Right eye: No discharge.        Left eye: No discharge.     Conjunctiva/sclera: Conjunctivae normal.     Pupils: Pupils are equal, round, and reactive to light.  Neck:     Musculoskeletal: Normal range of motion and neck supple.     Thyroid: No thyromegaly.     Vascular: No JVD.     Trachea: No tracheal deviation.  Cardiovascular:     Rate and Rhythm: Normal rate and regular rhythm.     Pulses: Normal pulses.     Heart sounds: Normal heart sounds. No murmur. No friction rub. No gallop.   Pulmonary:     Effort: Pulmonary effort is normal. No respiratory distress.     Breath sounds: Normal breath sounds. No stridor. No wheezing, rhonchi or rales.  Chest:     Chest wall: No tenderness.  Abdominal:     General: Bowel sounds are normal. There is no distension.     Palpations: Abdomen is soft. There is no mass.     Tenderness: There is no abdominal tenderness. There is no right CVA tenderness, left CVA tenderness, guarding or rebound.     Hernia: No hernia is present.  Musculoskeletal: Normal range of motion.        General: No swelling, tenderness, deformity or signs of injury.     Right lower leg: No edema.     Left lower leg: No edema.  Lymphadenopathy:     Cervical: No cervical adenopathy.  Skin:    General: Skin is warm and dry.     Capillary Refill: Capillary refill takes less than 2 seconds.     Coloration: Skin is not jaundiced or pale.     Findings: No bruising, erythema, lesion or rash.  Neurological:     General: No focal deficit present.     Mental Status: He is alert and oriented to person, place, and time. Mental status is at baseline.     Cranial Nerves: No cranial nerve deficit.     Sensory: No sensory deficit.     Motor: No abnormal muscle tone.     Coordination: Coordination normal.     Gait: Gait normal.     Deep Tendon Reflexes: Reflexes are normal and symmetric. Reflexes normal.   Psychiatric:        Mood and Affect: Mood normal.        Behavior: Behavior normal.        Thought Content: Thought content normal.        Judgment: Judgment normal.       Assessment & Plan:  1. Routine general medical examination at a health care facility -Continue to work on lifestyle modifications.  I would like him to start exercising more frequently -Follow-up in 1 year for next CPE or sooner if needed - CBC with Differential/Platelet - CMP - Hemoglobin A1c - Lipid panel - TSH  2. DM (diabetes mellitus), type 2 with complications ([place]) -Consider adding glipizide -Likely follow-up in 3 months - CBC with Differential/Platelet - CMP - Hemoglobin A1c - Lipid panel - TSH  3. Essential hypertension -No change in medication - CBC with Differential/Platelet - CMP - Hemoglobin A1c - Lipid panel - TSH  4. Benign prostatic hyperplasia without lower urinary tract symptoms  - PSA  5. Gout of ankle, unspecified cause, unspecified chronicity, unspecified laterality -Continue with allopurinol  6. Other specified hypothyroidism -Consider change in Synthroid - CBC with Differential/Platelet - CMP - Hemoglobin A1c - Lipid panel - TSH  7. Atherosclerosis of native coronary artery of native heart without angina pectoris - continue with current medication therapy and follow-up with cardiology as well as vascular as directed - CBC with Differential/Platelet - CMP - Hemoglobin A1c - Lipid panel - TSH  8. Depression, recurrent ([place]) - Continue with Zoloft   9. Colon cancer screening  - Screening Colonoscopy  Dorothyann Peng, NP

## 2019-10-13 NOTE — Telephone Encounter (Signed)
Sent to the pharmacy by e-scribe. 

## 2019-10-18 ENCOUNTER — Telehealth: Payer: Self-pay | Admitting: Adult Health

## 2019-10-18 NOTE — Telephone Encounter (Signed)
Updated patient on his labs.  A1c has improved from 7.5-7.2.  His kidney function was slightly decreased past baseline, he does report having diarrhea and dehydration.  We will recheck in 3 months

## 2019-11-14 ENCOUNTER — Encounter: Payer: Self-pay | Admitting: Adult Health

## 2019-11-18 ENCOUNTER — Other Ambulatory Visit: Payer: Self-pay | Admitting: Adult Health

## 2019-11-18 DIAGNOSIS — I10 Essential (primary) hypertension: Secondary | ICD-10-CM

## 2019-11-18 DIAGNOSIS — Z760 Encounter for issue of repeat prescription: Secondary | ICD-10-CM

## 2019-11-18 DIAGNOSIS — E1142 Type 2 diabetes mellitus with diabetic polyneuropathy: Secondary | ICD-10-CM

## 2019-11-18 DIAGNOSIS — N400 Enlarged prostate without lower urinary tract symptoms: Secondary | ICD-10-CM

## 2019-11-22 NOTE — Telephone Encounter (Signed)
Sent to the pharmacy by e-scribe. 

## 2019-11-28 IMAGING — DX DG CHEST 2V
2 series · 2 of 2 positions shown · non-contrast
Comparison: 12/30/2010

CLINICAL DATA: Night sweats for 2 years.

EXAM:
CHEST - 2 VIEW

[chest pa]
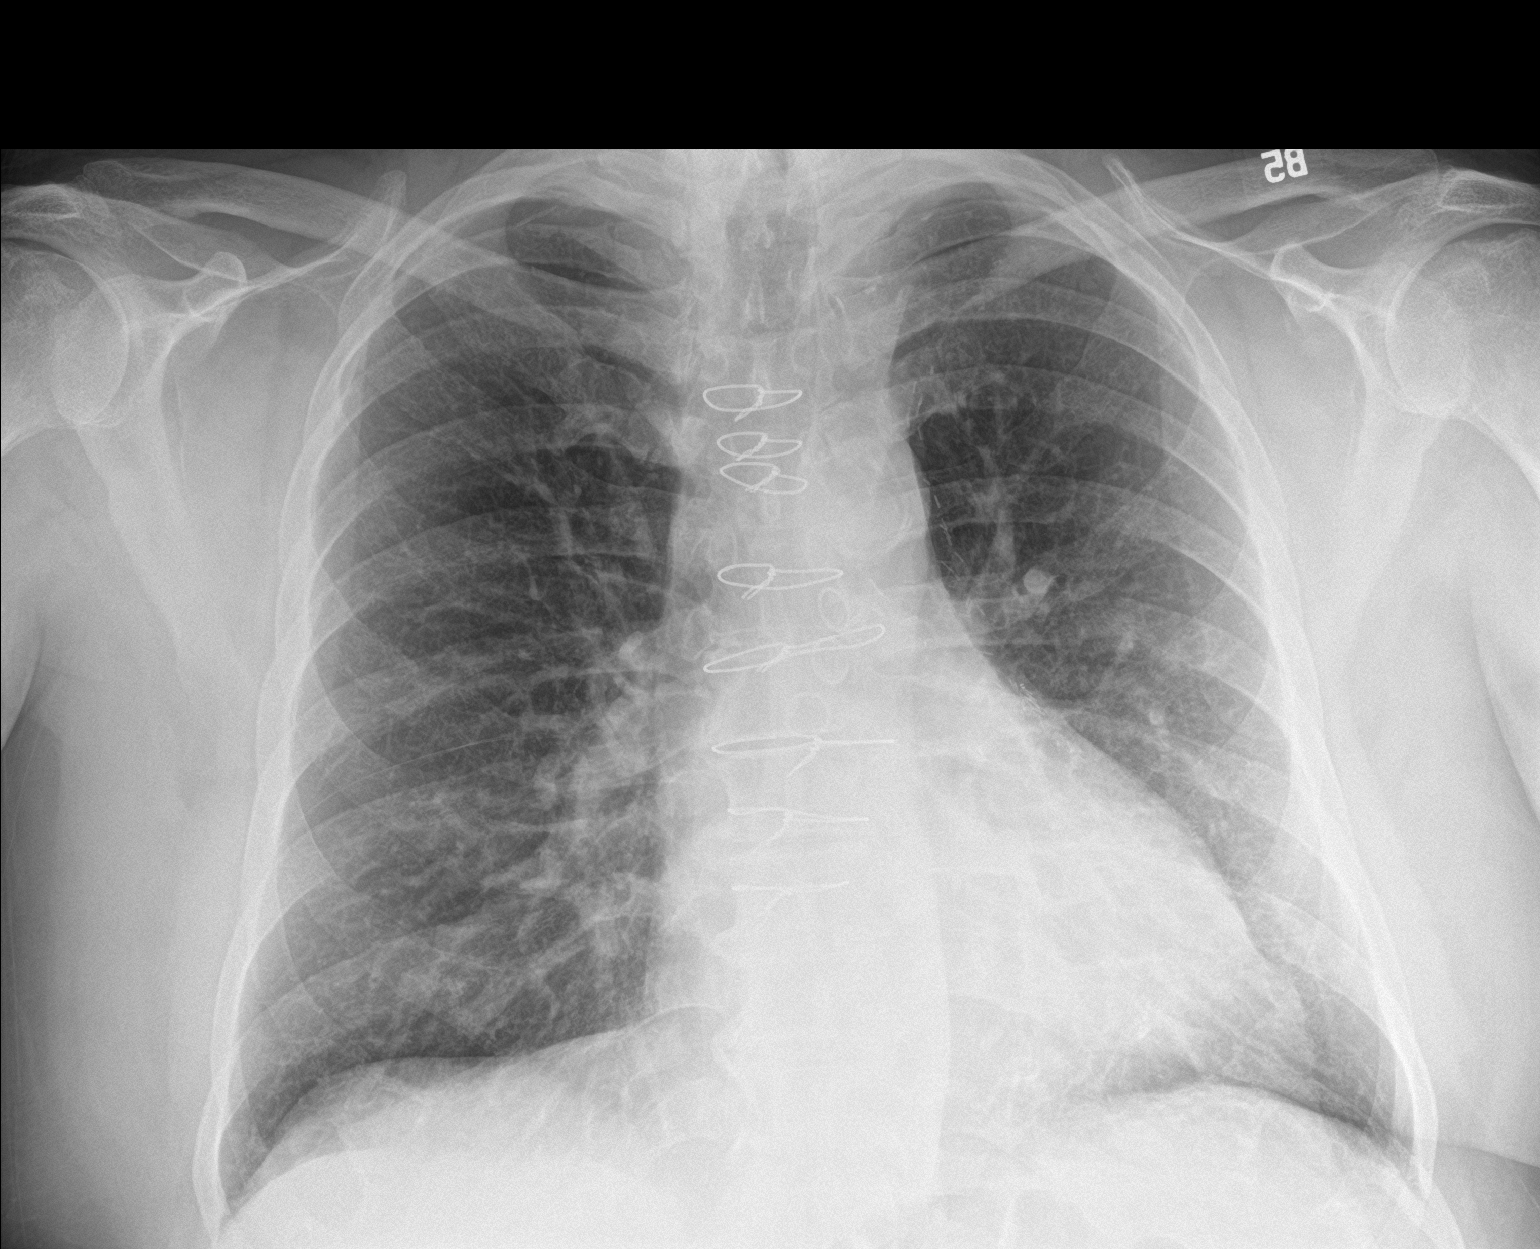

[chest lat]
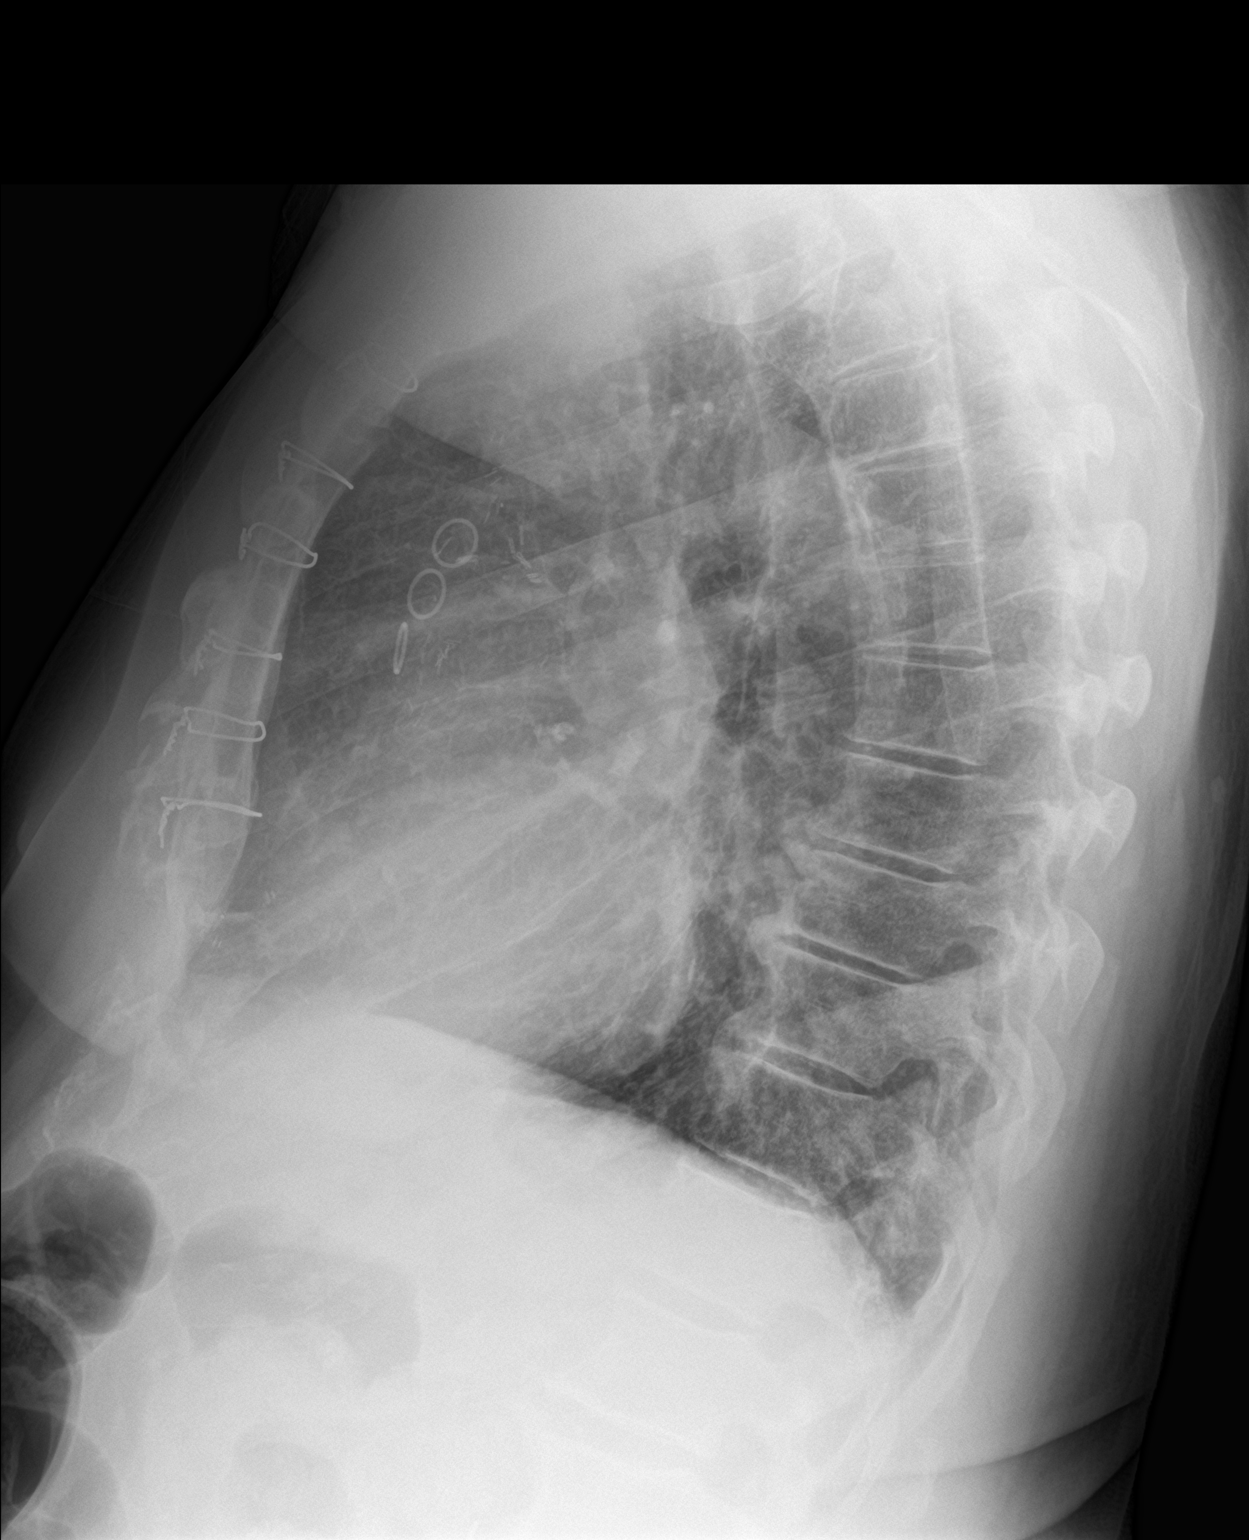

[2 of 2 positions shown; findings below may reference images not displayed]

FINDINGS: Postsurgical changes from CABG.

Mildly enlarged cardiac silhouette. Mediastinal contours appear
intact. Calcific atherosclerotic disease of the aorta.

There is no evidence of focal airspace consolidation, pleural
effusion or pneumothorax.

Osseous structures are without acute abnormality. Soft tissues are
grossly normal.
IMPRESSION: No active cardiopulmonary disease.

Enlarged cardiac silhouette.

Calcific atherosclerotic disease of the aorta.

## 2019-12-14 ENCOUNTER — Ambulatory Visit: Payer: Medicare Other | Admitting: Podiatry

## 2019-12-14 ENCOUNTER — Other Ambulatory Visit: Payer: Self-pay

## 2019-12-14 ENCOUNTER — Encounter: Payer: Self-pay | Admitting: Podiatry

## 2019-12-14 DIAGNOSIS — B351 Tinea unguium: Secondary | ICD-10-CM | POA: Diagnosis not present

## 2019-12-14 DIAGNOSIS — M79675 Pain in left toe(s): Secondary | ICD-10-CM

## 2019-12-14 DIAGNOSIS — E119 Type 2 diabetes mellitus without complications: Secondary | ICD-10-CM

## 2019-12-14 DIAGNOSIS — E1142 Type 2 diabetes mellitus with diabetic polyneuropathy: Secondary | ICD-10-CM | POA: Diagnosis not present

## 2019-12-14 DIAGNOSIS — L84 Corns and callosities: Secondary | ICD-10-CM

## 2019-12-14 DIAGNOSIS — M79674 Pain in right toe(s): Secondary | ICD-10-CM | POA: Diagnosis not present

## 2019-12-14 NOTE — Patient Instructions (Signed)
Diabetes Mellitus and Foot Care Foot care is an important part of your health, especially when you have diabetes. Diabetes may cause you to have problems because of poor blood flow (circulation) to your feet and legs, which can cause your skin to:  Become thinner and drier.  Break more easily.  Heal more slowly.  Peel and crack. You may also have nerve damage (neuropathy) in your legs and feet, causing decreased feeling in them. This means that you may not notice minor injuries to your feet that could lead to more serious problems. Noticing and addressing any potential problems early is the best way to prevent future foot problems. How to care for your feet Foot hygiene  Wash your feet daily with warm water and mild soap. Do not use hot water. Then, pat your feet and the areas between your toes until they are completely dry. Do not soak your feet as this can dry your skin.  Trim your toenails straight across. Do not dig under them or around the cuticle. File the edges of your nails with an emery board or nail file.  Apply a moisturizing lotion or petroleum jelly to the skin on your feet and to dry, brittle toenails. Use lotion that does not contain alcohol and is unscented. Do not apply lotion between your toes. Shoes and socks  Wear clean socks or stockings every day. Make sure they are not too tight. Do not wear knee-high stockings since they may decrease blood flow to your legs.  Wear shoes that fit properly and have enough cushioning. Always look in your shoes before you put them on to be sure there are no objects inside.  To break in new shoes, wear them for just a few hours a day. This prevents injuries on your feet. Wounds, scrapes, corns, and calluses  Check your feet daily for blisters, cuts, bruises, sores, and redness. If you cannot see the bottom of your feet, use a mirror or ask someone for help.  Do not cut corns or calluses or try to remove them with medicine.  If you  find a minor scrape, cut, or break in the skin on your feet, keep it and the skin around it clean and dry. You may clean these areas with mild soap and water. Do not clean the area with peroxide, alcohol, or iodine.  If you have a wound, scrape, corn, or callus on your foot, look at it several times a day to make sure it is healing and not infected. Check for: ? Redness, swelling, or pain. ? Fluid or blood. ? Warmth. ? Pus or a bad smell. General instructions  Do not cross your legs. This may decrease blood flow to your feet.  Do not use heating pads or hot water bottles on your feet. They may burn your skin. If you have lost feeling in your feet or legs, you may not know this is happening until it is too late.  Protect your feet from hot and cold by wearing shoes, such as at the beach or on hot pavement.  Schedule a complete foot exam at least once a year (annually) or more often if you have foot problems. If you have foot problems, report any cuts, sores, or bruises to your health care provider immediately. Contact a health care provider if:  You have a medical condition that increases your risk of infection and you have any cuts, sores, or bruises on your feet.  You have an injury that is not   healing.  You have redness on your legs or feet.  You feel burning or tingling in your legs or feet.  You have pain or cramps in your legs and feet.  Your legs or feet are numb.  Your feet always feel cold.  You have pain around a toenail. Get help right away if:  You have a wound, scrape, corn, or callus on your foot and: ? You have pain, swelling, or redness that gets worse. ? You have fluid or blood coming from the wound, scrape, corn, or callus. ? Your wound, scrape, corn, or callus feels warm to the touch. ? You have pus or a bad smell coming from the wound, scrape, corn, or callus. ? You have a fever. ? You have a red line going up your leg. Summary  Check your feet every day  for cuts, sores, red spots, swelling, and blisters.  Moisturize feet and legs daily.  Wear shoes that fit properly and have enough cushioning.  If you have foot problems, report any cuts, sores, or bruises to your health care provider immediately.  Schedule a complete foot exam at least once a year (annually) or more often if you have foot problems. This information is not intended to replace advice given to you by your health care provider. Make sure you discuss any questions you have with your health care provider. Document Revised: 08/03/2019 Document Reviewed: 12/12/2016 [employer]vier Patient Education  2020 [employer]  

## 2019-12-15 NOTE — Progress Notes (Signed)
HPI: FU hx of HLD, HTN, DM2, CAD s/p CABG 2001 with LIMA to LAD, SVG to diagonal, SVG to OM, SVG to RPDA, carotid stenosis; CKD.  Admitted 6/16 with a lateral STEMI. LHC showed patent LIMA to LAD, SVG to OM, SVG to RPDA, SVG to diagonal was occluded with severe native D1 disease. He was noted to have 95% proximal LAD stenosis after proximal septal and prior to the takeoff of D1 branch, the diagonal branch was occluded at the proximal third of the vessel, LAD was occluded shortly after the diagonal takeoff, subtotal occlusion of the AV groove circumflex and total occlusion of the native mid RCA after anterior RV marginal branch. PTCA was performed on the very proximal LAD prior to the diagonal and LAD bifurcation with 95% stenosis being reduced down to 60%. There was unsuccessful attempt at wiring the diagonal vessel due to acute angle takeoff which was occluded at the proximal third segment and had been supplied by the vein graft. Medical therapy was recommended. Post PCI, he continued to have chest discomfort which worsened with weaning of IV nitroglycerin. He was placed on oral nitrates with resolution of symptoms. EF on left ventriculogram during cardiac catheterization was 35-40%. Follow-up echo demonstrated EF 50-55% with possible posterior lateral HK, mild MR, mild LAE.  Since last seen,  there is no increased dyspnea on exertion, orthopnea, PND or pedal edema.  He denies exertional chest pain.  He has developed occasional palpitations associated with mild chest heaviness.  These occur more at night and last minutes and resolve.  There is no syncope.  Current Outpatient Medications  Medication Sig Dispense Refill  . allopurinol (ZYLOPRIM) 100 MG tablet TAKE 2 TABLETS BY MOUTH  DAILY 180 tablet 3  . amLODipine (NORVASC) 5 MG tablet TAKE 1 TABLET BY MOUTH  DAILY 90 tablet 3  . aspirin 81 MG chewable tablet Chew 1 tablet (81 mg total) by mouth daily.    Marland Kitchen atorvastatin (LIPITOR) 80 MG  tablet TAKE 1 TABLET BY MOUTH  DAILY AT 6 P.M. 90 tablet 3  . carvedilol (COREG) 25 MG tablet TAKE 1 TABLET BY MOUTH  TWICE DAILY WITH MEALS 180 tablet 3  . Cholecalciferol (VITAMIN D3) 125 MCG (5000 UT) TABS Take by mouth.    Marland Kitchen FLUAD 0.5 ML SUSY ADM 0.5ML IM UTD  0  . gabapentin (NEURONTIN) 100 MG capsule TAKE 1 CAPSULE BY MOUTH 3  TIMES DAILY 270 capsule 1  . isosorbide mononitrate (IMDUR) 60 MG 24 hr tablet TAKE 1 TABLET BY MOUTH  DAILY 90 tablet 3  . levothyroxine (SYNTHROID) 100 MCG tablet TAKE 1 TABLET BY MOUTH  DAILY 90 tablet 3  . metFORMIN (GLUCOPHAGE) 500 MG tablet TAKE 1 TABLET BY MOUTH 2 TIMES DAILY WITH A MEAL. 180 tablet 0  . nitroGLYCERIN (NITROSTAT) 0.4 MG SL tablet DISSOLVE 1 TABLET UNDER THE TONGUE EVERY 5 MINUTES AS  NEEDED FOR CHEST PAIN FOR  UP TO 3 DOSES. SEEK MEDICAL ATTENTION IF NO RELIEF. 90 tablet 3  . Omega-3 Fatty Acids (FISH OIL) 1000 MG CPDR Take by mouth.    . sertraline (ZOLOFT) 25 MG tablet TAKE 1 TABLET BY MOUTH  DAILY 90 tablet 3  . tamsulosin (FLOMAX) 0.4 MG CAPS capsule TAKE 1 CAPSULE BY MOUTH  DAILY 90 capsule 3   No current facility-administered medications for this visit.     Past Medical History:  Diagnosis Date  . Benign prostatic hypertrophy   . CAD (coronary artery disease)  cath 05/16/2015 patent LIMA to LAD, SVG to OM, SVG to RPDA, SVG to diagonal was occluded. PTCA 95% prox LAD reduce down to 60%. Medical management  . Carotid artery occlusion   . Colitis, ischemic ([place]) 06/2005    history of  . Diabetes mellitus type II   . Diabetes, polyneuropathy ([place])   . Diabetic peripheral neuropathy ([place])   . DVT (deep venous thrombosis) ([place])   . Gout   . History of myocardial infarction   . History of syncope    most likely vasovagal  . History of tobacco abuse   . Hyperlipidemia   . Hypertension   . Hypothyroidism   . Myocardial infarction ([place])   . Peripheral vascular disease ([place])   . Shingles   . Thrombus 04/2008   acute thrombus of  his right superficial artery with claudication    Past Surgical History:  Procedure Laterality Date  . CARDIAC CATHETERIZATION N/A 05/15/2015   Procedure: Left Heart Cath and Cors/Grafts Angiography;  Surgeon: Troy Sine, MD;  Location: [facility];  Service: Cardiovascular;  Laterality: N/A;  . CATARACT EXTRACTION Bilateral   . CORONARY ARTERY BYPASS GRAFT  08/04/00   times 4 (left interanl mammary artery to the left anterior descending, saphenous vein,graft to diagonal, saphenous vein graft to obtuse marginal, spahenous vein graft to posterior descending-Surgeon  Tharon Aquas Tright III  . LOWER EXTREMITY ANGIOGRAM  March 09, 2012  . LOWER EXTREMITY ANGIOGRAM Right 03/09/2012   Procedure: LOWER EXTREMITY ANGIOGRAM;  Surgeon: Serafina Mitchell, MD;  Location: [facility] CATH [facility];  Service: Cardiovascular;  Laterality: Right;  . OTHER SURGICAL HISTORY  11/2010   angiogram, right leg stent placement-  -Dr Kellie Simmering  . peripheral vascular surgery    . PR VEIN BYPASS GRAFT,AORTO-FEM-POP  08/04/00    Social History   Socioeconomic History  . Marital status: Married    Spouse name: Not on file  . Number of children: Not on file  . Years of education: Not on file  . Highest education level: Not on file  Occupational History  . Not on file  Tobacco Use  . Smoking status: Former Smoker    Packs/day: 1.00    Years: 50.00    Pack years: 50.00    Types: Cigarettes    Quit date: 04/14/2011    Years since quitting: 8.6  . Smokeless tobacco: Never Used  Substance and Sexual Activity  . Alcohol use: No  . Drug use: No  . Sexual activity: Not on file  Other Topics Concern  . Not on file  Social History Narrative   Occupation:  Retired from [employer]    Married - lives with wife (2nd marriage)   3 children     Alcohol use-no   Tobacco Use - Quit smoking 3 years ago          Social Determinants of Radio broadcast assistant Strain:   . Difficulty of Paying Living Expenses: Not  on file  Food Insecurity:   . Worried About Charity fundraiser in the Last Year: Not on file  . Ran Out of Food in the Last Year: Not on file  Transportation Needs:   . Lack of Transportation (Medical): Not on file  . Lack of Transportation (Non-Medical): Not on file  Physical Activity:   . Days of Exercise per Week: Not on file  . Minutes of Exercise per Session: Not on file  Stress:   . Feeling of  Stress : Not on file  Social Connections:   . Frequency of Communication with Friends and Family: Not on file  . Frequency of Social Gatherings with Friends and Family: Not on file  . Attends Religious Services: Not on file  . Active Member of Clubs or Organizations: Not on file  . Attends Archivist Meetings: Not on file  . Marital Status: Not on file  Intimate Partner Violence:   . Fear of Current or Ex-Partner: Not on file  . Emotionally Abused: Not on file  . Physically Abused: Not on file  . Sexually Abused: Not on file    Family History  Problem Relation Age of Onset  . Deep vein thrombosis Mother        Varicose Veins  . Heart disease Mother   . Hyperlipidemia Mother   . Hypertension Mother   . Peripheral vascular disease Mother   . Lung cancer Mother   . Stroke Father   . Deep vein thrombosis Father   . Diabetes Father   . Heart disease Father        Heart Disease  before age 27  . Hyperlipidemia Father   . Hypertension Father   . Heart attack Father   . Peripheral vascular disease Father   . Alcohol abuse Unknown   . Stroke Unknown   . Coronary artery disease Unknown   . Hyperlipidemia Unknown   . Deep vein thrombosis Brother   . Diabetes Brother   . Heart disease Brother        Heart Disease before age 62  . Hyperlipidemia Brother   . Hypertension Brother   . Heart attack Brother   . Peripheral vascular disease Brother   . Lung cancer Sister   . Kidney cancer Brother        on hospice   . Other Neg Hx        denies family history of blood  clots or blood disorder    ROS: no fevers or chills, productive cough, hemoptysis, dysphasia, odynophagia, melena, hematochezia, dysuria, hematuria, rash, seizure activity, orthopnea, PND, pedal edema, claudication. Remaining systems are negative.  Physical Exam: Well-developed well-nourished in no acute distress.  Skin is warm and dry.  HEENT is normal.  Neck is supple.  Chest is clear to auscultation with normal expansion.  Cardiovascular exam is regular rate and rhythm.  Abdominal exam nontender or distended. No masses palpated. Extremities show no edema. neuro grossly intact  ECG-normal sinus rhythm at a rate of 72, lateral T wave inversion.  Personally reviewed  A/P  1 coronary artery disease-Continue medical therapy with aspirin, beta-blocker, isosorbide and statin.  2 hypertension-patient's blood pressure is elevated.  However he has not taken his medications this morning and he follows this at home and it is typically controlled.  Continue present regimen and follow.  3 hyperlipidemia-continue statin.  4 carotid artery disease/peripheral vascular disease-followed by vascular surgery.  Continue aspirin and statin.  5 palpitations-he has developed occasional heart racing with mild chest heaviness.  We will plan a 30-day event monitor to further assess.  Repeat echocardiogram to assess LV function.  Kirk Ruths, MD

## 2019-12-18 ENCOUNTER — Other Ambulatory Visit: Payer: Self-pay | Admitting: Adult Health

## 2019-12-18 DIAGNOSIS — E118 Type 2 diabetes mellitus with unspecified complications: Secondary | ICD-10-CM

## 2019-12-18 NOTE — Progress Notes (Addendum)
Subjective: Fernando Combs presents today for follow up of preventative diabetic foot care: multiple plantar calluses and painful mycotic nails b/l that are difficult to trim. Pain interferes with ambulation. Aggravating factors include wearing enclosed shoe gear. Pain is relieved with periodic professional debridement.   No Known Allergies   Objective: There were no vitals filed for this visit.  Vascular Examination:  capillary refill time to digits immediate b/l, faintly palpable DP pulses b/l, faintly palpable PT pulses b/l, pedal hair absent b/l and skin temperature gradient within normal limits b/l  Dermatological Examination: Pedal skin with normal turgor, texture and tone bilaterally, no open wounds bilaterally, no interdigital macerations bilaterally, toenails 1-5 b/l elongated, dystrophic, thickened, crumbly with subungual debris and hyperkeratotic lesion(s) submet heads 1, 4 right foot, submet head 2 left foot and submet heads 5 b/l.  No erythema, no edema, no drainage, no flocculence  Musculoskeletal: normal muscle strength 5/5 to all lower extremity muscle groups bilaterally, no gross bony deformities bilaterally and no pain crepitus or joint limitation noted with ROM b/l  Neurological: sensation absent with 10g monofilament and vibratory sensation absent  Assessment: 1. Pain due to onychomycosis of toenails of both feet   2. Callus   3. Diabetic peripheral neuropathy associated with type 2 diabetes mellitus ([place])   4. Encounter for diabetic foot exam ([place])     Plan: Continue diabetic foot care principles. Literature dispensed on today.  -Toenails 1-5 b/l were debrided in length and girth without iatrogenic bleeding. -The above corns and calluses were debrided without complication or incident. Total number debrided =5 submet heads 1, 4 right, submet head 2 left and submet heads 5 b/l -Patient to continue soft, supportive shoe gear daily. -Patient to report any pedal  injuries to medical professional immediately. -[name] to call should there be question/concern in the interim.  Return in about 3 months (around 03/13/2020) for diabetic nail trim.

## 2019-12-20 ENCOUNTER — Other Ambulatory Visit: Payer: Self-pay

## 2019-12-20 ENCOUNTER — Encounter: Payer: Self-pay | Admitting: Cardiology

## 2019-12-20 ENCOUNTER — Telehealth: Payer: Self-pay | Admitting: *Deleted

## 2019-12-20 ENCOUNTER — Ambulatory Visit: Payer: Medicare Other | Admitting: Cardiology

## 2019-12-20 VITALS — BP 152/84 | HR 72 | Ht 69.0 in | Wt 232.0 lb

## 2019-12-20 DIAGNOSIS — E7800 Pure hypercholesterolemia, unspecified: Secondary | ICD-10-CM

## 2019-12-20 DIAGNOSIS — R002 Palpitations: Secondary | ICD-10-CM | POA: Diagnosis not present

## 2019-12-20 DIAGNOSIS — I10 Essential (primary) hypertension: Secondary | ICD-10-CM

## 2019-12-20 DIAGNOSIS — I2510 Atherosclerotic heart disease of native coronary artery without angina pectoris: Secondary | ICD-10-CM | POA: Diagnosis not present

## 2019-12-20 NOTE — Telephone Encounter (Signed)
Ok to refill until then

## 2019-12-20 NOTE — Telephone Encounter (Signed)
Patient enrolled for [employer] to ship a 30 day cardiac event monitor to his home.   

## 2019-12-20 NOTE — Patient Instructions (Signed)
Medication Instructions:  NO CHANGE *If you need a refill on your cardiac medications before your next appointment, please call your pharmacy*  Lab Work: If you have labs (blood work) drawn today and your tests are completely normal, you will receive your results only by: Marland Kitchen MyChart Message (if you have MyChart) OR . A paper copy in the mail If you have any lab test that is abnormal or we need to change your treatment, we will call you to review the results.  Testing/Procedures: Your physician has requested that you have an echocardiogram. Echocardiography is a painless test that uses sound waves to create images of your heart. It provides your doctor with information about the size and shape of your heart and how well your heart's chambers and valves are working. This procedure takes approximately one hour. There are no restrictions for this procedure.[place] has recommended that you wear a 30 DAY event monitor. Event monitors are medical devices that record the heart's electrical activity. Doctors most often [place] these monitors to diagnose arrhythmias. Arrhythmias are problems with the speed or rhythm of the heartbeat. The monitor is a small, portable device. You can wear one while you do your normal daily activities. This is usually used to diagnose what is causing palpitations/syncope (passing out).MONITOR WILL BE MAILED TO YOUR HOME      Follow-Up: At [facility], you and your health needs are our priority.  As part of our continuing mission to provide you with exceptional heart care, we have created designated Provider Care Teams.  These Care Teams include your primary Cardiologist (physician) and Advanced Practice Providers (APPs -  Physician Assistants and Nurse Practitioners) who all work together to provide you with the care you need, when you need it.  Your next appointment:   6 month(s)  The format for your next appointment:   Either In Person or  Virtual  Provider:   You may see Kirk Ruths, MD or one of the following Advanced Practice Providers on your designated Care Team:    Kerin Ransom, PA-C  [place], [place]  Coletta Memos, [place]

## 2019-12-21 ENCOUNTER — Encounter: Payer: Self-pay | Admitting: Adult Health

## 2019-12-21 LAB — HM DIABETES EYE EXAM

## 2019-12-29 ENCOUNTER — Other Ambulatory Visit: Payer: Self-pay

## 2019-12-29 ENCOUNTER — Ambulatory Visit (HOSPITAL_COMMUNITY): Payer: Medicare Other | Attending: Cardiology

## 2019-12-29 DIAGNOSIS — I119 Hypertensive heart disease without heart failure: Secondary | ICD-10-CM | POA: Insufficient documentation

## 2019-12-29 DIAGNOSIS — I2510 Atherosclerotic heart disease of native coronary artery without angina pectoris: Secondary | ICD-10-CM | POA: Diagnosis not present

## 2019-12-29 DIAGNOSIS — I082 Rheumatic disorders of both aortic and tricuspid valves: Secondary | ICD-10-CM | POA: Diagnosis not present

## 2019-12-29 DIAGNOSIS — E119 Type 2 diabetes mellitus without complications: Secondary | ICD-10-CM | POA: Diagnosis not present

## 2019-12-29 DIAGNOSIS — I252 Old myocardial infarction: Secondary | ICD-10-CM | POA: Insufficient documentation

## 2019-12-29 DIAGNOSIS — I358 Other nonrheumatic aortic valve disorders: Secondary | ICD-10-CM

## 2019-12-29 DIAGNOSIS — I071 Rheumatic tricuspid insufficiency: Secondary | ICD-10-CM

## 2019-12-29 DIAGNOSIS — Z8249 Family history of ischemic heart disease and other diseases of the circulatory system: Secondary | ICD-10-CM | POA: Insufficient documentation

## 2019-12-29 DIAGNOSIS — R002 Palpitations: Secondary | ICD-10-CM

## 2019-12-29 DIAGNOSIS — Z87891 Personal history of nicotine dependence: Secondary | ICD-10-CM | POA: Diagnosis not present

## 2019-12-30 ENCOUNTER — Ambulatory Visit (INDEPENDENT_AMBULATORY_CARE_PROVIDER_SITE_OTHER): Payer: Medicare Other

## 2019-12-30 DIAGNOSIS — R002 Palpitations: Secondary | ICD-10-CM

## 2020-01-01 ENCOUNTER — Telehealth: Payer: Self-pay | Admitting: Cardiology

## 2020-01-01 NOTE — Telephone Encounter (Signed)
I was contact by [employer] services overnight for 8-beat run of NSVT on ambulatory monitor. Rhythm was sinus in 70s before and after the event and rate of NSVT was 153 bpm. The device was placed due to palpitations. I will update Mr. Dwyne' primary cardiologist in the morning.   Osvaldo Shipper, MD

## 2020-01-02 NOTE — Telephone Encounter (Signed)
Continue beta blocker Kirk Ruths

## 2020-01-09 ENCOUNTER — Telehealth: Payer: Self-pay | Admitting: Physician Assistant

## 2020-01-09 NOTE — Telephone Encounter (Signed)
Called by [employer] to report Afib at 100 bpm with 4 PVCs at [address] time.   [employer] did not try to call the patient.   I was able to speak with the patient this morning. He was sleeping at the time of the event and just prior to my phone call. He is asymptomatic. [employer] to fax strips.   [employer] phone: [phone]  Triage, please call [employer] back this morning and make sure they fax strips for Dr. Stanford Breed to review.

## 2020-01-10 NOTE — Telephone Encounter (Signed)
Dr Stanford Breed reviewed strips and rhythm is sinus with pac's and pvc's. No change in medications at this time.

## 2020-01-10 NOTE — Telephone Encounter (Signed)
Please pull strips Kirk Ruths

## 2020-01-11 LAB — HM DIABETES EYE EXAM

## 2020-01-13 ENCOUNTER — Other Ambulatory Visit: Payer: Self-pay | Admitting: Adult Health

## 2020-01-13 DIAGNOSIS — E118 Type 2 diabetes mellitus with unspecified complications: Secondary | ICD-10-CM

## 2020-01-23 ENCOUNTER — Other Ambulatory Visit: Payer: Self-pay

## 2020-01-24 ENCOUNTER — Encounter: Payer: Self-pay | Admitting: Adult Health

## 2020-01-24 ENCOUNTER — Telehealth: Payer: Self-pay | Admitting: Adult Health

## 2020-01-24 ENCOUNTER — Ambulatory Visit (INDEPENDENT_AMBULATORY_CARE_PROVIDER_SITE_OTHER): Payer: Medicare Other | Admitting: Adult Health

## 2020-01-24 VITALS — BP 136/80 | Temp 98.1°F | Wt 230.0 lb

## 2020-01-24 DIAGNOSIS — E118 Type 2 diabetes mellitus with unspecified complications: Secondary | ICD-10-CM

## 2020-01-24 DIAGNOSIS — N529 Male erectile dysfunction, unspecified: Secondary | ICD-10-CM | POA: Diagnosis not present

## 2020-01-24 DIAGNOSIS — E559 Vitamin D deficiency, unspecified: Secondary | ICD-10-CM

## 2020-01-24 LAB — VITAMIN D 25 HYDROXY (VIT D DEFICIENCY, FRACTURES): VITD: 65.65 ng/mL (ref 30.00–100.00)

## 2020-01-24 LAB — POCT GLYCOSYLATED HEMOGLOBIN (HGB A1C): HbA1c, POC (controlled diabetic range): 6.3 % (ref 0.0–7.0)

## 2020-01-24 MED ORDER — TADALAFIL 20 MG PO TABS: 10.0000 mg | ORAL_TABLET | ORAL | 1 refills | Status: DC | PRN

## 2020-01-24 MED ORDER — VITAMIN D-3 125 MCG (5000 UT) PO TABS: 1.0000 | ORAL_TABLET | Freq: Every day | ORAL | 3 refills | Status: AC

## 2020-01-24 NOTE — Progress Notes (Signed)
Subjective:    Patient ID: Fernando Combs, male    DOB: Dec 07, 1945, 74 y.o.   MRN: [id number]  HPI  74 year old male who  has a past medical history of Benign prostatic hypertrophy, CAD (coronary artery disease), Carotid artery occlusion, Colitis, ischemic ([place]) (06/2005), Diabetes mellitus type II, Diabetes, polyneuropathy ([place]), Diabetic peripheral neuropathy ([place]), DVT (deep venous thrombosis) ([place]), Gout, History of myocardial infarction, History of syncope, History of tobacco abuse, Hyperlipidemia, Hypertension, Hypothyroidism, Myocardial infarction [facility]), Peripheral vascular disease ([place]), Shingles, and Thrombus (04/2008).   He presents to the office today for 53-month follow-up regarding diabetes.  He is currently maintained on metformin 500 mg twice daily.  He does not monitor his blood sugars at home.  Since he is last seen he denies symptoms of hypoglycemia.  He has not suffered any side effects of metformin feels as though this medication keeps his blood sugars well controlled.  He has been working on diet and staying more active.  Lab Results  Component Value Date   HGBA1C 6.3 01/24/2020   Additionally, he would like to check his vitamin D level to see if he still needs to take vitamin D 50,000 units.  The last vitamin D was in 2019 at which time it was 17.79  He is also wondering if he can have a prescription for an erectile dysfunction medication.  ED has been a longstanding issue for the patient but his started in a new relationship like to try something.  He is prescribed isosorbide and nitro but has not taken nitro and 6 months or longer.  Review of Systems See HPI   Past Medical History:  Diagnosis Date  . Benign prostatic hypertrophy   . CAD (coronary artery disease)    cath 05/16/2015 patent LIMA to LAD, SVG to OM, SVG to RPDA, SVG to diagonal was occluded. PTCA 95% prox LAD reduce down to 60%. Medical management  . Carotid artery occlusion   . Colitis, ischemic ([place])  06/2005    history of  . Diabetes mellitus type II   . Diabetes, polyneuropathy ([place])   . Diabetic peripheral neuropathy ([place])   . DVT (deep venous thrombosis) ([place])   . Gout   . History of myocardial infarction   . History of syncope    most likely vasovagal  . History of tobacco abuse   . Hyperlipidemia   . Hypertension   . Hypothyroidism   . Myocardial infarction ([place])   . Peripheral vascular disease ([place])   . Shingles   . Thrombus 04/2008   acute thrombus of his right superficial artery with claudication    Social History   Socioeconomic History  . Marital status: Married    Spouse name: Not on file  . Number of children: Not on file  . Years of education: Not on file  . Highest education level: Not on file  Occupational History  . Not on file  Tobacco Use  . Smoking status: Former Smoker    Packs/day: 1.00    Years: 50.00    Pack years: 50.00    Types: Cigarettes    Quit date: 04/14/2011    Years since quitting: 8.7  . Smokeless tobacco: Never Used  Substance and Sexual Activity  . Alcohol use: No  . Drug use: No  . Sexual activity: Not on file  Other Topics Concern  . Not on file  Social History Narrative   Occupation:  Retired from [employer]    Married -  lives with wife (2nd marriage)   3 children     Alcohol use-no   Tobacco Use - Quit smoking 3 years ago          Social Determinants of Radio broadcast assistant Strain:   . Difficulty of Paying Living Expenses: Not on file  Food Insecurity:   . Worried About Charity fundraiser in the Last Year: Not on file  . Ran Out of Food in the Last Year: Not on file  Transportation Needs:   . Lack of Transportation (Medical): Not on file  . Lack of Transportation (Non-Medical): Not on file  Physical Activity:   . Days of Exercise per Week: Not on file  . Minutes of Exercise per Session: Not on file  Stress:   . Feeling of Stress : Not on file  Social Connections:   . Frequency of  Communication with Friends and Family: Not on file  . Frequency of Social Gatherings with Friends and Family: Not on file  . Attends Religious Services: Not on file  . Active Member of Clubs or Organizations: Not on file  . Attends Archivist Meetings: Not on file  . Marital Status: Not on file  Intimate Partner Violence:   . Fear of Current or Ex-Partner: Not on file  . Emotionally Abused: Not on file  . Physically Abused: Not on file  . Sexually Abused: Not on file    Past Surgical History:  Procedure Laterality Date  . CARDIAC CATHETERIZATION N/A 05/15/2015   Procedure: Left Heart Cath and Cors/Grafts Angiography;  Surgeon: Troy Sine, MD;  Location: [facility];  Service: Cardiovascular;  Laterality: N/A;  . CATARACT EXTRACTION Bilateral   . CORONARY ARTERY BYPASS GRAFT  08/04/00   times 4 (left interanl mammary artery to the left anterior descending, saphenous vein,graft to diagonal, saphenous vein graft to obtuse marginal, spahenous vein graft to posterior descending-Surgeon  Tharon Aquas Tright III  . LOWER EXTREMITY ANGIOGRAM  March 09, 2012  . LOWER EXTREMITY ANGIOGRAM Right 03/09/2012   Procedure: LOWER EXTREMITY ANGIOGRAM;  Surgeon: Serafina Mitchell, MD;  Location: [facility] CATH LAB;  Service: Cardiovascular;  Laterality: Right;  . OTHER SURGICAL HISTORY  11/2010   angiogram, right leg stent placement-  -Dr Kellie Simmering  . peripheral vascular surgery    . PR VEIN BYPASS GRAFT,AORTO-FEM-POP  08/04/00    Family History  Problem Relation Age of Onset  . Deep vein thrombosis Mother        Varicose Veins  . Heart disease Mother   . Hyperlipidemia Mother   . Hypertension Mother   . Peripheral vascular disease Mother   . Lung cancer Mother   . Stroke Father   . Deep vein thrombosis Father   . Diabetes Father   . Heart disease Father        Heart Disease  before age 9  . Hyperlipidemia Father   . Hypertension Father   . Heart attack Father   . Peripheral vascular  disease Father   . Alcohol abuse Unknown   . Stroke Unknown   . Coronary artery disease Unknown   . Hyperlipidemia Unknown   . Deep vein thrombosis Brother   . Diabetes Brother   . Heart disease Brother        Heart Disease before age 73  . Hyperlipidemia Brother   . Hypertension Brother   . Heart attack Brother   . Peripheral vascular disease Brother   . Lung  cancer Sister   . Kidney cancer Brother        on hospice   . Other Neg Hx        denies family history of blood clots or blood disorder    No Known Allergies  Current Outpatient Medications on File Prior to Visit  Medication Sig Dispense Refill  . allopurinol (ZYLOPRIM) 100 MG tablet TAKE 2 TABLETS BY MOUTH  DAILY 180 tablet 3  . amLODipine (NORVASC) 5 MG tablet TAKE 1 TABLET BY MOUTH  DAILY 90 tablet 3  . aspirin 81 MG chewable tablet Chew 1 tablet (81 mg total) by mouth daily.    Marland Kitchen atorvastatin (LIPITOR) 80 MG tablet TAKE 1 TABLET BY MOUTH  DAILY AT 6 P.M. 90 tablet 3  . carvedilol (COREG) 25 MG tablet TAKE 1 TABLET BY MOUTH  TWICE DAILY WITH MEALS 180 tablet 3  . Cholecalciferol (VITAMIN D3) 125 MCG (5000 UT) TABS Take by mouth.    Marland Kitchen FLUAD 0.5 ML SUSY ADM 0.5ML IM UTD  0  . gabapentin (NEURONTIN) 100 MG capsule TAKE 1 CAPSULE BY MOUTH 3  TIMES DAILY 270 capsule 1  . isosorbide mononitrate (IMDUR) 60 MG 24 hr tablet TAKE 1 TABLET BY MOUTH  DAILY 90 tablet 3  . levothyroxine (SYNTHROID) 100 MCG tablet TAKE 1 TABLET BY MOUTH  DAILY 90 tablet 3  . metFORMIN (GLUCOPHAGE) 500 MG tablet TAKE 1 TABLET BY MOUTH 2 TIMES DAILY WITH A MEAL. 60 tablet 0  . nitroGLYCERIN (NITROSTAT) 0.4 MG SL tablet DISSOLVE 1 TABLET UNDER THE TONGUE EVERY 5 MINUTES AS  NEEDED FOR CHEST PAIN FOR  UP TO 3 DOSES. SEEK MEDICAL ATTENTION IF NO RELIEF. 90 tablet 3  . Omega-3 Fatty Acids (FISH OIL) 1000 MG CPDR Take by mouth.    . sertraline (ZOLOFT) 25 MG tablet TAKE 1 TABLET BY MOUTH  DAILY 90 tablet 3  . tamsulosin (FLOMAX) 0.4 MG CAPS capsule TAKE 1  CAPSULE BY MOUTH  DAILY 90 capsule 3   No current facility-administered medications on file prior to visit.    BP 136/80   Temp 98.1 F (36.7 C) (Temporal)   Wt 230 lb (104.3 kg)   BMI 33.97 kg/m       Objective:   Physical Exam Vitals and nursing note reviewed.  Constitutional:      Appearance: Normal appearance.  Cardiovascular:     Rate and Rhythm: Normal rate and regular rhythm.     Pulses: Normal pulses.     Heart sounds: Normal heart sounds.  Pulmonary:     Effort: Pulmonary effort is normal.     Breath sounds: Normal breath sounds.  Skin:    General: Skin is warm and dry.     Capillary Refill: Capillary refill takes less than 2 seconds.  Neurological:     General: No focal deficit present.     Mental Status: He is alert and oriented to person, place, and time.  Psychiatric:        Mood and Affect: Mood normal.        Behavior: Behavior normal.        Thought Content: Thought content normal.        Judgment: Judgment normal.       Assessment & Plan:  1. DM (diabetes mellitus), type 2 with complications (HCC)  - POCT A1C- 6.3. Has improved. Continue with Metformin. Follow up in 4 months   2. Erectile dysfunction, unspecified erectile dysfunction type -Reviewed side effects with  both Cialis and Viagra.  He was cautioned on using both medications with Imdur as well as nitro.  He understands his risks and will try low-dose Cialis. - tadalafil (CIALIS) 20 MG tablet; Take 0.5-1 tablets (10-20 mg total) by mouth every other day as needed for erectile dysfunction.  Dispense: 15 tablet; Refill: 1  3. Vitamin D deficiency  - Vitamin D, 25-hydroxy  Dorothyann Peng, NP

## 2020-01-24 NOTE — Patient Instructions (Signed)
COVID-19 Vaccine Information can be found at: [email or web address] For questions related to vaccine distribution or appointments, please email [email or web address] or call [phone].    

## 2020-01-24 NOTE — Telephone Encounter (Signed)
Spoke to patient and informed him of his vitamin D level.  Vitamin D level is currently at 65 I am going to have him stop the vitamin D 50,000 units and maintain him on 5000 units daily

## 2020-02-11 ENCOUNTER — Other Ambulatory Visit: Payer: Self-pay | Admitting: Adult Health

## 2020-02-11 DIAGNOSIS — E118 Type 2 diabetes mellitus with unspecified complications: Secondary | ICD-10-CM

## 2020-03-16 ENCOUNTER — Other Ambulatory Visit: Payer: Self-pay

## 2020-03-16 ENCOUNTER — Ambulatory Visit: Payer: Medicare Other | Admitting: Podiatry

## 2020-03-16 ENCOUNTER — Encounter: Payer: Self-pay | Admitting: Podiatry

## 2020-03-16 VITALS — Temp 96.6°F

## 2020-03-16 DIAGNOSIS — E1142 Type 2 diabetes mellitus with diabetic polyneuropathy: Secondary | ICD-10-CM

## 2020-03-16 DIAGNOSIS — M79674 Pain in right toe(s): Secondary | ICD-10-CM

## 2020-03-16 DIAGNOSIS — M79675 Pain in left toe(s): Secondary | ICD-10-CM | POA: Diagnosis not present

## 2020-03-16 DIAGNOSIS — L84 Corns and callosities: Secondary | ICD-10-CM

## 2020-03-16 DIAGNOSIS — B351 Tinea unguium: Secondary | ICD-10-CM | POA: Diagnosis not present

## 2020-03-16 NOTE — Patient Instructions (Signed)
Diabetes Mellitus and Foot Care Foot care is an important part of your health, especially when you have diabetes. Diabetes may cause you to have problems because of poor blood flow (circulation) to your feet and legs, which can cause your skin to:  Become thinner and drier.  Break more easily.  Heal more slowly.  Peel and crack. You may also have nerve damage (neuropathy) in your legs and feet, causing decreased feeling in them. This means that you may not notice minor injuries to your feet that could lead to more serious problems. Noticing and addressing any potential problems early is the best way to prevent future foot problems. How to care for your feet Foot hygiene  Wash your feet daily with warm water and mild soap. Do not use hot water. Then, pat your feet and the areas between your toes until they are completely dry. Do not soak your feet as this can dry your skin.  Trim your toenails straight across. Do not dig under them or around the cuticle. File the edges of your nails with an emery board or nail file.  Apply a moisturizing lotion or petroleum jelly to the skin on your feet and to dry, brittle toenails. Use lotion that does not contain alcohol and is unscented. Do not apply lotion between your toes. Shoes and socks  Wear clean socks or stockings every day. Make sure they are not too tight. Do not wear knee-high stockings since they may decrease blood flow to your legs.  Wear shoes that fit properly and have enough cushioning. Always look in your shoes before you put them on to be sure there are no objects inside.  To break in new shoes, wear them for just a few hours a day. This prevents injuries on your feet. Wounds, scrapes, corns, and calluses  Check your feet daily for blisters, cuts, bruises, sores, and redness. If you cannot see the bottom of your feet, use a mirror or ask someone for help.  Do not cut corns or calluses or try to remove them with medicine.  If you  find a minor scrape, cut, or break in the skin on your feet, keep it and the skin around it clean and dry. You may clean these areas with mild soap and water. Do not clean the area with peroxide, alcohol, or iodine.  If you have a wound, scrape, corn, or callus on your foot, look at it several times a day to make sure it is healing and not infected. Check for: ? Redness, swelling, or pain. ? Fluid or blood. ? Warmth. ? Pus or a bad smell. General instructions  Do not cross your legs. This may decrease blood flow to your feet.  Do not use heating pads or hot water bottles on your feet. They may burn your skin. If you have lost feeling in your feet or legs, you may not know this is happening until it is too late.  Protect your feet from hot and cold by wearing shoes, such as at the beach or on hot pavement.  Schedule a complete foot exam at least once a year (annually) or more often if you have foot problems. If you have foot problems, report any cuts, sores, or bruises to your health care provider immediately. Contact a health care provider if:  You have a medical condition that increases your risk of infection and you have any cuts, sores, or bruises on your feet.  You have an injury that is not   healing.  You have redness on your legs or feet.  You feel burning or tingling in your legs or feet.  You have pain or cramps in your legs and feet.  Your legs or feet are numb.  Your feet always feel cold.  You have pain around a toenail. Get help right away if:  You have a wound, scrape, corn, or callus on your foot and: ? You have pain, swelling, or redness that gets worse. ? You have fluid or blood coming from the wound, scrape, corn, or callus. ? Your wound, scrape, corn, or callus feels warm to the touch. ? You have pus or a bad smell coming from the wound, scrape, corn, or callus. ? You have a fever. ? You have a red line going up your leg. Summary  Check your feet every day  for cuts, sores, red spots, swelling, and blisters.  Moisturize feet and legs daily.  Wear shoes that fit properly and have enough cushioning.  If you have foot problems, report any cuts, sores, or bruises to your health care provider immediately.  Schedule a complete foot exam at least once a year (annually) or more often if you have foot problems. This information is not intended to replace advice given to you by your health care provider. Make sure you discuss any questions you have with your health care provider. Document Revised: 08/03/2019 Document Reviewed: 12/12/2016 Elsevier Patient Education  2020 [employer]  Peripheral Neuropathy Peripheral neuropathy is a type of nerve damage. It affects nerves that carry signals between the spinal cord and the arms, legs, and the rest of the body (peripheral nerves). It does not affect nerves in the spinal cord or brain. In peripheral neuropathy, one nerve or a group of nerves may be damaged. Peripheral neuropathy is a broad category that includes many specific nerve disorders, like diabetic neuropathy, hereditary neuropathy, and carpal tunnel syndrome. What are the causes? This condition may be caused by:  Diabetes. This is the most common cause of peripheral neuropathy.  Nerve injury.  Pressure or stress on a nerve that lasts a long time.  Lack (deficiency) of B vitamins. This can result from alcoholism, poor diet, or a restricted diet.  Infections.  Autoimmune diseases, such as rheumatoid arthritis and systemic lupus erythematosus.  Nerve diseases that are passed from parent to child (inherited).  Some medicines, such as cancer medicines (chemotherapy).  Poisonous (toxic) substances, such as lead and mercury.  Too little blood flowing to the legs.  Kidney disease.  Thyroid disease. In some cases, the cause of this condition is not known. What are the signs or symptoms? Symptoms of this condition depend on which of your  nerves is damaged. Common symptoms include:  Loss of feeling (numbness) in the feet, hands, or both.  Tingling in the feet, hands, or both.  Burning pain.  Very sensitive skin.  Weakness.  Not being able to move a part of the body (paralysis).  Muscle twitching.  Clumsiness or poor coordination.  Loss of balance.  Not being able to control your bladder.  Feeling dizzy.  Sexual problems. How is this diagnosed? Diagnosing and finding the cause of peripheral neuropathy can be difficult. Your health care provider will take your medical history and do a physical exam. A neurological exam will also be done. This involves checking things that are affected by your brain, spinal cord, and nerves (nervous system). For example, your health care provider will check your reflexes, how you move, and   what you can feel. You may have other tests, such as:  Blood tests.  Electromyogram (EMG) and nerve conduction tests. These tests check nerve function and how well the nerves are controlling the muscles.  Imaging tests, such as CT scans or MRI to rule out other causes of your symptoms.  Removing a small piece of nerve to be examined in a lab (nerve biopsy). This is rare.  Removing and examining a small amount of the fluid that surrounds the brain and spinal cord (lumbar puncture). This is rare. How is this treated? Treatment for this condition may involve:  Treating the underlying cause of the neuropathy, such as diabetes, kidney disease, or vitamin deficiencies.  Stopping medicines that can cause neuropathy, such as chemotherapy.  Medicine to relieve pain. Medicines may include: ? Prescription or over-the-counter pain medicine. ? Antiseizure medicine. ? Antidepressants. ? Pain-relieving patches that are applied to painful areas of skin.  Surgery to relieve pressure on a nerve or to destroy a nerve that is causing pain.  Physical therapy to help improve movement and  balance.  Devices to help you move around (assistive devices). Follow these instructions at home: Medicines  Take over-the-counter and prescription medicines only as told by your health care provider. Do not take any other medicines without first asking your health care provider.  Do not drive or use heavy machinery while taking prescription pain medicine. Lifestyle   Do not use any products that contain nicotine or tobacco, such as cigarettes and e-cigarettes. Smoking keeps blood from reaching damaged nerves. If you need help quitting, ask your health care provider.  Avoid or limit alcohol. Too much alcohol can cause a vitamin B deficiency, and vitamin B is needed for healthy nerves.  Eat a healthy diet. This includes: ? Eating foods that are high in fiber, such as fresh fruits and vegetables, whole grains, and beans. ? Limiting foods that are high in fat and processed sugars, such as fried or sweet foods. General instructions   If you have diabetes, work closely with your health care provider to keep your blood sugar under control.  If you have numbness in your feet: ? Check every day for signs of injury or infection. Watch for redness, warmth, and swelling. ? Wear padded socks and comfortable shoes. These help protect your feet.  Develop a good support system. Living with peripheral neuropathy can be stressful. Consider talking with a mental health specialist or joining a support group.  Use assistive devices and attend physical therapy as told by your health care provider. This may include using a walker or a cane.  Keep all follow-up visits as told by your health care provider. This is important. Contact a health care provider if:  You have new signs or symptoms of peripheral neuropathy.  You are struggling emotionally from dealing with peripheral neuropathy.  Your pain is not well-controlled. Get help right away if:  You have an injury or infection that is not healing  normally.  You develop new weakness in an arm or leg.  You fall frequently. Summary  Peripheral neuropathy is when the nerves in the arms, or legs are damaged, resulting in numbness, weakness, or pain.  There are many causes of peripheral neuropathy, including diabetes, pinched nerves, vitamin deficiencies, autoimmune disease, and hereditary conditions.  Diagnosing and finding the cause of peripheral neuropathy can be difficult. Your health care provider will take your medical history, do a physical exam, and do tests, including blood tests and nerve function tests.    Treatment involves treating the underlying cause of the neuropathy and taking medicines to help control pain. Physical therapy and assistive devices may also help. This information is not intended to replace advice given to you by your health care provider. Make sure you discuss any questions you have with your health care provider. Document Revised: 10/23/2017 Document Reviewed: 01/19/2017 Elsevier Patient Education  2020 [employer]  Corns and Calluses Corns are small areas of thickened skin that occur on the top, sides, or tip of a toe. They contain a cone-shaped core with a point that can press on a nerve below. This causes pain.  Calluses are areas of thickened skin that can occur anywhere on the body, including the hands, fingers, palms, soles of the feet, and heels. Calluses are usually larger than corns. What are the causes? Corns and calluses are caused by rubbing (friction) or pressure, such as from shoes that are too tight or do not fit properly. What increases the risk? Corns are more likely to develop in people who have misshapen toes (toe deformities), such as hammer toes. Calluses can occur with friction to any area of the skin. They are more likely to develop in people who:  Work with their hands.  Wear shoes that fit poorly, are too tight, or are high-heeled.  Have toe deformities. What are the signs  or symptoms? Symptoms of a corn or callus include:  A hard growth on the skin.  Pain or tenderness under the skin.  Redness and swelling.  Increased discomfort while wearing tight-fitting shoes, if your feet are affected. If a corn or callus becomes infected, symptoms may include:  Redness and swelling that gets worse.  Pain.  Fluid, blood, or pus draining from the corn or callus. How is this diagnosed? Corns and calluses may be diagnosed based on your symptoms, your medical history, and a physical exam. How is this treated? Treatment for corns and calluses may include:  Removing the cause of the friction or pressure. This may involve: ? Changing your shoes. ? Wearing shoe inserts (orthotics) or other protective layers in your shoes, such as a corn pad. ? Wearing gloves.  Applying medicine to the skin (topical medicine) to help soften skin in the hardened, thickened areas.  Removing layers of dead skin with a file to reduce the size of the corn or callus.  Removing the corn or callus with a scalpel or laser.  Taking antibiotic medicines, if your corn or callus is infected.  Having surgery, if a toe deformity is the cause. Follow these instructions at home:   Take over-the-counter and prescription medicines only as told by your health care provider.  If you were prescribed an antibiotic, take it as told by your health care provider. Do not stop taking it even if your condition starts to improve.  Wear shoes that fit well. Avoid wearing high-heeled shoes and shoes that are too tight or too loose.  Wear any padding, protective layers, gloves, or orthotics as told by your health care provider.  Soak your hands or feet and then use a file or pumice stone to soften your corn or callus. Do this as told by your health care provider.  Check your corn or callus every day for symptoms of infection. Contact a health care provider if you:  Notice that your symptoms do not  improve with treatment.  Have redness or swelling that gets worse.  Notice that your corn or callus becomes painful.  Have fluid, blood, or   pus coming from your corn or callus.  Have new symptoms. Summary  Corns are small areas of thickened skin that occur on the top, sides, or tip of a toe.  Calluses are areas of thickened skin that can occur anywhere on the body, including the hands, fingers, palms, and soles of the feet. Calluses are usually larger than corns.  Corns and calluses are caused by rubbing (friction) or pressure, such as from shoes that are too tight or do not fit properly.  Treatment may include wearing any padding, protective layers, gloves, or orthotics as told by your health care provider. This information is not intended to replace advice given to you by your health care provider. Make sure you discuss any questions you have with your health care provider. Document Revised: 03/02/2019 Document Reviewed: 09/23/2017 Elsevier Patient Education  2020 [employer] Inc[employer]  

## 2020-03-22 NOTE — Progress Notes (Addendum)
Subjective: Fernando Combs presents today for follow up of at risk foot care with history of diabetic neuropathy and callus(es) b/l feet and painful mycotic toenails b/l that are difficult to trim. Pain interferes with ambulation. Aggravating factors include wearing enclosed shoe gear. Pain is relieved with periodic professional debridement.   Mr. Fernando Combs voices no new pedal concerns on today's visit.  No Known Allergies   Objective: Vitals:   03/16/20 1101  Temp: (!) 96.6 F (35.9 C)   Pt is a pleasant  74 y.o. year old Caucasian male  in NAD. AAO x 3.   Vascular Examination:  Capillary fill time to digits <3 seconds b/l. Faintly palpable DP pulses b/l. Faintly palpable PT pulses b/l. Pedal hair absent b/l Skin temperature gradient within normal limits b/l. No edema noted b/l.  Dermatological Examination: Pedal skin with normal turgor, texture and tone bilaterally. No open wounds bilaterally. No interdigital macerations bilaterally. Toenails 1-5 b/l elongated, dystrophic, thickened, crumbly with subungual debris and tenderness to dorsal palpation. Hyperkeratotic lesion(s) submet head 1 right foot, submet head 2 left foot, submet head 4 right foot, submet head 5 left foot and submet head 5 right foot.  No erythema, no edema, no drainage, no flocculence.  Musculoskeletal: Normal muscle strength 5/5 to all lower extremity muscle groups bilaterally. No gross bony deformities bilaterally. No pain crepitus or joint limitation noted with ROM b/l.  Neurological: Protective sensation diminished with 10g monofilament b/l. Proprioception intact bilaterally. Babinski reflex negative b/l. Clonus negative b/l.  Assessment: 1. Pain due to onychomycosis of toenails of both feet   2. Callus   3. Diabetic peripheral neuropathy associated with type 2 diabetes mellitus ([place])    Plan: -Continue diabetic foot care principles. Literature dispensed on today.  -Toenails 1-5 b/l were debrided in length and  girth with sterile nail nippers and dremel without iatrogenic bleeding.  -Callus(es) submet head 1 right foot, submet head 2 left foot, submet head 4 right foot, submet head 5 left foot and submet head 5 right foot pared utilizing sterile scalpel blade without complication or incident. Total number debrided =5. -Patient to continue soft, supportive shoe gear daily. -Patient to report any pedal injuries to medical professional immediately. -[name] to call should there be question/concern in the interim.  Return in about 3 months (around 06/15/2020) for diabetic nail and callus trim.

## 2020-03-30 ENCOUNTER — Telehealth: Payer: Self-pay | Admitting: Adult Health

## 2020-03-30 NOTE — Chronic Care Management (AMB) (Signed)
  Chronic Care Management   Note  03/30/2020 Name: Fernando Combs MRN: [id number] DOB: 26-May-1946  Fernando Combs is a 74 y.o. year old male who is a primary care patient of Dorothyann Peng, NP. I reached out to [name] by phone today in response to a referral sent by Fernando Combs's PCP, Dorothyann Peng, NP.   Fernando Combs was given information about Chronic Care Management services today including:  1. CCM service includes personalized support from designated clinical staff supervised by his physician, including individualized plan of care and coordination with other care providers 2. 24/7 contact phone numbers for assistance for urgent and routine care needs. 3. Service will only be billed when office clinical staff spend 20 minutes or more in a month to coordinate care. 4. Only one practitioner may furnish and bill the service in a calendar month. 5. The patient may stop CCM services at any time (effective at the end of the month) by phone call to the office staff.   Patient agreed to services and verbal consent obtained.   Follow up plan:   [place]

## 2020-04-24 ENCOUNTER — Ambulatory Visit: Payer: Medicare Other

## 2020-05-04 ENCOUNTER — Other Ambulatory Visit: Payer: Self-pay | Admitting: Adult Health

## 2020-05-04 DIAGNOSIS — N529 Male erectile dysfunction, unspecified: Secondary | ICD-10-CM

## 2020-05-08 NOTE — Telephone Encounter (Signed)
That is the correct sig

## 2020-05-08 NOTE — Telephone Encounter (Signed)
Dr. Jeralene Peters [facility] pharmacy called to check on this refill. Informed them the Doctor is reviwing this and should receive an answer shortly

## 2020-05-08 NOTE — Telephone Encounter (Signed)
Sent to the pharmacy by e-scribe. 

## 2020-05-10 ENCOUNTER — Other Ambulatory Visit: Payer: Self-pay | Admitting: Adult Health

## 2020-05-10 DIAGNOSIS — E118 Type 2 diabetes mellitus with unspecified complications: Secondary | ICD-10-CM

## 2020-05-11 NOTE — Telephone Encounter (Signed)
PHARMACY REQUESTING 90 DAY SUPPLY.  PT IS SCHEDULED FOR UPCOMING A1C FU.  SENT IN FOR 90 DAYS.

## 2020-05-25 ENCOUNTER — Encounter: Payer: Self-pay | Admitting: Adult Health

## 2020-05-25 ENCOUNTER — Other Ambulatory Visit: Payer: Self-pay

## 2020-05-25 ENCOUNTER — Ambulatory Visit (INDEPENDENT_AMBULATORY_CARE_PROVIDER_SITE_OTHER): Payer: Medicare Other | Admitting: Adult Health

## 2020-05-25 VITALS — BP 128/70 | Temp 97.8°F | Wt 230.0 lb

## 2020-05-25 DIAGNOSIS — E118 Type 2 diabetes mellitus with unspecified complications: Secondary | ICD-10-CM | POA: Diagnosis not present

## 2020-05-25 DIAGNOSIS — N400 Enlarged prostate without lower urinary tract symptoms: Secondary | ICD-10-CM

## 2020-05-25 LAB — POCT GLYCOSYLATED HEMOGLOBIN (HGB A1C): HbA1c, POC (controlled diabetic range): 6.4 % (ref 0.0–7.0)

## 2020-05-25 MED ORDER — METFORMIN HCL 500 MG PO TABS: ORAL_TABLET | ORAL | 1 refills | Status: DC

## 2020-05-25 MED ORDER — TAMSULOSIN HCL 0.4 MG PO CAPS: 0.8000 mg | ORAL_CAPSULE | Freq: Every day | ORAL | 1 refills | Status: AC

## 2020-05-25 NOTE — Progress Notes (Signed)
Subjective:    Patient ID: Fernando Combs, male    DOB: 1946-05-16, 73 y.o.   MRN: [id number]  HPI 74 year old male who  has a past medical history of Benign prostatic hypertrophy, CAD (coronary artery disease), Carotid artery occlusion, Colitis, ischemic ([place]) (06/2005), Diabetes mellitus type II, Diabetes, polyneuropathy ([place]), Diabetic peripheral neuropathy ([place]), DVT (deep venous thrombosis) ([place]), Gout, History of myocardial infarction, History of syncope, History of tobacco abuse, Hyperlipidemia, Hypertension, Hypothyroidism, Myocardial infarction [facility]), Peripheral vascular disease ([place]), Shingles, and Thrombus (04/2008).   He presents to the office today for 58-month follow-up regarding diabetes mellitus.  He is currently maintained on Metformin 500 mg twice a day.  He does not monitor his blood sugars at home and since the last time we saw each other he denies symptoms of hypoglycemia.  He has not suffered from any side effects of metformin and feels as though this medication keeps his blood sugars well controlled.  He has been working on diet and staying more active   Wt Readings from Last 3 Encounters:  05/25/20 230 lb (104.3 kg)  01/24/20 230 lb (104.3 kg)  12/20/19 232 lb (105.2 kg)   Additionally, he is on Flomax for BPH symptoms reports that he was well controlled on this medication for some time but over the last month or so he started developed more frequent nocturia and is often getting up 4 times a night to urinate.  He does endorse decreased stream and incomplete bladder emptying.  Denies dysuria, or hematuria  Review of Systems See HPI   Past Medical History:  Diagnosis Date  . Benign prostatic hypertrophy   . CAD (coronary artery disease)    cath 05/16/2015 patent LIMA to LAD, SVG to OM, SVG to RPDA, SVG to diagonal was occluded. PTCA 95% prox LAD reduce down to 60%. Medical management  . Carotid artery occlusion   . Colitis, ischemic ([place]) 06/2005    history of  .  Diabetes mellitus type II   . Diabetes, polyneuropathy ([place])   . Diabetic peripheral neuropathy ([place])   . DVT (deep venous thrombosis) ([place])   . Gout   . History of myocardial infarction   . History of syncope    most likely vasovagal  . History of tobacco abuse   . Hyperlipidemia   . Hypertension   . Hypothyroidism   . Myocardial infarction ([place])   . Peripheral vascular disease ([place])   . Shingles   . Thrombus 04/2008   acute thrombus of his right superficial artery with claudication    Social History   Socioeconomic History  . Marital status: Married    Spouse name: Not on file  . Number of children: Not on file  . Years of education: Not on file  . Highest education level: Not on file  Occupational History  . Not on file  Tobacco Use  . Smoking status: Former Smoker    Packs/day: 1.00    Years: 50.00    Pack years: 50.00    Types: Cigarettes    Quit date: 04/14/2011    Years since quitting: 9.1  . Smokeless tobacco: Never Used  Substance and Sexual Activity  . Alcohol use: No  . Drug use: No  . Sexual activity: Not on file  Other Topics Concern  . Not on file  Social History Narrative   Occupation:  Retired from [employer]    Married - lives with wife (2nd marriage)   3 children  Alcohol use-no   Tobacco Use - Quit smoking 3 years ago          Social Determinants of Radio broadcast assistant Strain:   . Difficulty of Paying Living Expenses:   Food Insecurity:   . Worried About Charity fundraiser in the Last Year:   . Arboriculturist in the Last Year:   Transportation Needs:   . Film/video editor (Medical):   Marland Kitchen Lack of Transportation (Non-Medical):   Physical Activity:   . Days of Exercise per Week:   . Minutes of Exercise per Session:   Stress:   . Feeling of Stress :   Social Connections:   . Frequency of Communication with Friends and Family:   . Frequency of Social Gatherings with Friends and Family:   . Attends Religious  Services:   . Active Member of Clubs or Organizations:   . Attends Archivist Meetings:   Marland Kitchen Marital Status:   Intimate Partner Violence:   . Fear of Current or Ex-Partner:   . Emotionally Abused:   Marland Kitchen Physically Abused:   . Sexually Abused:     Past Surgical History:  Procedure Laterality Date  . CARDIAC CATHETERIZATION N/A 05/15/2015   Procedure: Left Heart Cath and Cors/Grafts Angiography;  Surgeon: Troy Sine, MD;  Location: [facility];  Service: Cardiovascular;  Laterality: N/A;  . CATARACT EXTRACTION Bilateral   . CORONARY ARTERY BYPASS GRAFT  08/04/00   times 4 (left interanl mammary artery to the left anterior descending, saphenous vein,graft to diagonal, saphenous vein graft to obtuse marginal, spahenous vein graft to posterior descending-Surgeon  Tharon Aquas Tright III  . LOWER EXTREMITY ANGIOGRAM  March 09, 2012  . LOWER EXTREMITY ANGIOGRAM Right 03/09/2012   Procedure: LOWER EXTREMITY ANGIOGRAM;  Surgeon: Serafina Mitchell, MD;  Location: [facility];  Service: Cardiovascular;  Laterality: Right;  . OTHER SURGICAL HISTORY  11/2010   angiogram, right leg stent placement-  -Dr Kellie Simmering  . peripheral vascular surgery    . PR VEIN BYPASS GRAFT,AORTO-FEM-POP  08/04/00    Family History  Problem Relation Age of Onset  . Deep vein thrombosis Mother        Varicose Veins  . Heart disease Mother   . Hyperlipidemia Mother   . Hypertension Mother   . Peripheral vascular disease Mother   . Lung cancer Mother   . Stroke Father   . Deep vein thrombosis Father   . Diabetes Father   . Heart disease Father        Heart Disease  before age 16  . Hyperlipidemia Father   . Hypertension Father   . Heart attack Father   . Peripheral vascular disease Father   . Alcohol abuse Unknown   . Stroke Unknown   . Coronary artery disease Unknown   . Hyperlipidemia Unknown   . Deep vein thrombosis Brother   . Diabetes Brother   . Heart disease Brother        Heart Disease  before age 57  . Hyperlipidemia Brother   . Hypertension Brother   . Heart attack Brother   . Peripheral vascular disease Brother   . Lung cancer Sister   . Kidney cancer Brother        on hospice   . Other Neg Hx        denies family history of blood clots or blood disorder    No Known Allergies  Current Outpatient Medications  on File Prior to Visit  Medication Sig Dispense Refill  . allopurinol (ZYLOPRIM) 100 MG tablet TAKE 2 TABLETS BY MOUTH  DAILY 180 tablet 3  . amLODipine (NORVASC) 5 MG tablet TAKE 1 TABLET BY MOUTH  DAILY 90 tablet 3  . aspirin 81 MG chewable tablet Chew 1 tablet (81 mg total) by mouth daily.    Marland Kitchen atorvastatin (LIPITOR) 80 MG tablet TAKE 1 TABLET BY MOUTH  DAILY AT 6 P.M. 90 tablet 3  . carvedilol (COREG) 25 MG tablet TAKE 1 TABLET BY MOUTH  TWICE DAILY WITH MEALS 180 tablet 3  . Cholecalciferol (VITAMIN D-3) 125 MCG (5000 UT) TABS Take 1 tablet by mouth daily. 90 tablet 3  . FLUAD 0.5 ML SUSY ADM 0.5ML IM UTD  0  . gabapentin (NEURONTIN) 100 MG capsule TAKE 1 CAPSULE BY MOUTH 3  TIMES DAILY 270 capsule 1  . isosorbide mononitrate (IMDUR) 60 MG 24 hr tablet TAKE 1 TABLET BY MOUTH  DAILY 90 tablet 3  . levothyroxine (SYNTHROID) 100 MCG tablet TAKE 1 TABLET BY MOUTH  DAILY 90 tablet 3  . nitroGLYCERIN (NITROSTAT) 0.4 MG SL tablet DISSOLVE 1 TABLET UNDER THE TONGUE EVERY 5 MINUTES AS  NEEDED FOR CHEST PAIN FOR  UP TO 3 DOSES. SEEK MEDICAL ATTENTION IF NO RELIEF. 90 tablet 3  . Omega-3 Fatty Acids (FISH OIL) 1000 MG CPDR Take by mouth.    . sertraline (ZOLOFT) 25 MG tablet TAKE 1 TABLET BY MOUTH  DAILY 90 tablet 3  . tadalafil (CIALIS) 20 MG tablet TAKE ONE TABLET BY MOUTH 30 MINUTES PRIOR TO INTERCOURSE. 30 tablet 5   No current facility-administered medications on file prior to visit.    BP 128/70   Temp 97.8 F (36.6 C)   Wt 230 lb (104.3 kg)   BMI 33.97 kg/m       Objective:   Physical Exam Vitals and nursing note reviewed.  Constitutional:       Appearance: Normal appearance.  Cardiovascular:     Rate and Rhythm: Normal rate and regular rhythm.     Pulses: Normal pulses.     Heart sounds: Normal heart sounds.  Pulmonary:     Effort: Pulmonary effort is normal.     Breath sounds: Normal breath sounds.  Musculoskeletal:        General: Normal range of motion.  Skin:    General: Skin is warm and dry.  Neurological:     General: No focal deficit present.     Mental Status: He is alert and oriented to person, place, and time.  Psychiatric:        Mood and Affect: Mood normal.        Behavior: Behavior normal.        Thought Content: Thought content normal.        Judgment: Judgment normal.       Assessment & Plan:  1. DM (diabetes mellitus), type 2 with complications (HCC)  - POCT A1C- 6.4 - at goal. No change in medication therapy - Will have him follow up in November for his CPE - metFORMIN (GLUCOPHAGE) 500 MG tablet; TAKE 1 TABLET BY MOUTH 2 TIMES DAILY WITH A MEAL.  Dispense: 180 tablet; Refill: 1  2. BPH (benign prostatic hyperplasia) - Will increase Flomax to 0.8 mg daily. Advised follow up if no improvement and at that time will consider adding Proscar  - tamsulosin (FLOMAX) 0.4 MG CAPS capsule; Take 2 capsules (0.8 mg total) by mouth daily.  Dispense: 180 capsule; Refill: 1   Dorothyann Peng, NP

## 2020-05-25 NOTE — Patient Instructions (Addendum)
It was great seeing you today   Your A1c was 6.4. - I am going to keep your dose of metformin the same   I have increased your Flomax to 0.8 mg - this is two tabs of the current dosing   Follow up anytime after November 18th for your physical exam

## 2020-05-30 ENCOUNTER — Other Ambulatory Visit: Payer: Self-pay | Admitting: Adult Health

## 2020-05-30 DIAGNOSIS — E118 Type 2 diabetes mellitus with unspecified complications: Secondary | ICD-10-CM

## 2020-05-30 DIAGNOSIS — I2510 Atherosclerotic heart disease of native coronary artery without angina pectoris: Secondary | ICD-10-CM

## 2020-05-30 NOTE — Chronic Care Management (AMB) (Deleted)
Chronic Care Management Pharmacy  Name: Fernando Combs  MRN: [id number] DOB: [birth year]  Chief Complaint/ HPI  Fernando Combs,  74 y.o. , male presents for their {Initial/Follow-up:3041532} CCM visit with the clinical pharmacist {CHL HP Upstream Pharm visit MPNT:[id number]}.  PCP : Dorothyann Peng, NP  Their chronic conditions include: HTN, CAD, Hx of MI, DM II, HLD, hypothyroidism, BPH, CKD, gout, adjustment disorder with mixed anxiety and depressed mood, left back pain  *** 5/7  Office Visits: 05/25/20 OV - DM II A1c of 6.4% - at goal on metformin 500 mg twice daily. BPH - will increase tamsulosin to 0.8 mg daily. If no improvement, will consider Proscar.  Consult Visit: None  Medications: Outpatient Encounter Medications as of 06/05/2020  Medication Sig  . allopurinol (ZYLOPRIM) 100 MG tablet TAKE 2 TABLETS BY MOUTH  DAILY  . amLODipine (NORVASC) 5 MG tablet TAKE 1 TABLET BY MOUTH  DAILY  . aspirin 81 MG chewable tablet Chew 1 tablet (81 mg total) by mouth daily.  Marland Kitchen atorvastatin (LIPITOR) 80 MG tablet TAKE 1 TABLET BY MOUTH  DAILY AT 6 P.M.  . carvedilol (COREG) 25 MG tablet TAKE 1 TABLET BY MOUTH  TWICE DAILY WITH MEALS  . Cholecalciferol (VITAMIN D-3) 125 MCG (5000 UT) TABS Take 1 tablet by mouth daily.  Marland Kitchen FLUAD 0.5 ML SUSY ADM 0.5ML IM UTD  . gabapentin (NEURONTIN) 100 MG capsule TAKE 1 CAPSULE BY MOUTH 3  TIMES DAILY  . isosorbide mononitrate (IMDUR) 60 MG 24 hr tablet TAKE 1 TABLET BY MOUTH  DAILY  . levothyroxine (SYNTHROID) 100 MCG tablet TAKE 1 TABLET BY MOUTH  DAILY  . metFORMIN (GLUCOPHAGE) 500 MG tablet TAKE 1 TABLET BY MOUTH 2 TIMES DAILY WITH A MEAL.  . nitroGLYCERIN (NITROSTAT) 0.4 MG SL tablet DISSOLVE 1 TABLET UNDER THE TONGUE EVERY 5 MINUTES AS  NEEDED FOR CHEST PAIN FOR  UP TO 3 DOSES. SEEK MEDICAL ATTENTION IF NO RELIEF.  Marland Kitchen Omega-3 Fatty Acids (FISH OIL) 1000 MG CPDR Take by mouth.  . sertraline (ZOLOFT) 25 MG tablet TAKE 1 TABLET BY MOUTH  DAILY  .  tadalafil (CIALIS) 20 MG tablet TAKE ONE TABLET BY MOUTH 30 MINUTES PRIOR TO INTERCOURSE.  . tamsulosin (FLOMAX) 0.4 MG CAPS capsule Take 2 capsules (0.8 mg total) by mouth daily.   No facility-administered encounter medications on file as of 06/05/2020.     Current Diagnosis/Assessment:  Goals Addressed   None     {CHL HP Upstream Pharmacy Diagnosis/Assessment:[phone]}   Hypertension   BP today is:  {CHL HP UPSTREAM Pharmacist BP ranges:[phone]}  Office blood pressures are  BP Readings from Last 3 Encounters:  05/25/20 128/70  01/24/20 136/80  12/20/19 (!) 152/84    Patient has failed these meds in the past: losartan Patient is currently {CHL Controlled/[phone]} on the following medications:  . Amlodipine 5 mg 1 tablet daily . Carvedilol 25 mg 1 tablet twice daily with meals . Isosorbide mononitrate 60 mg 1 tablet daily . Nitrostat 0.4 mg SL 1 tablet under tongue every 5 mins PRN for chest pain for up to 3 doses.  Patient checks BP at home {CHL HP BP Monitoring [phone]}  Patient home BP readings are ranging: ***  We discussed {CHL HP Upstream Pharmacy discussion:[phone]}  Plan  Continue {CHL HP Upstream Pharmacy [phone]}    Hyperlipidemia   Lipid Panel     Component Value Date/Time   CHOL 106 10/13/2019 1124   TRIG 250.0 (H) 10/13/2019 1124  HDL 36.10 (L) 10/13/2019 1124   CHOLHDL 3 10/13/2019 1124   VLDL 50.0 (H) 10/13/2019 1124   LDLCALC 29 10/01/2018 1036   LDLDIRECT 37.0 10/13/2019 1124     The ASCVD Risk score (Goff DC Jr., et al., 2013) failed to calculate for the following reasons:   The patient has a prior MI or stroke diagnosis   Patient has failed these meds in past: None Patient is currently {CHL Controlled/[phone]} on the following medications:  . Atorvastatin 80 mg 1 tablet daily at 6PM  We discussed:  {CHL HP Upstream Pharmacy [phone]}  Plan  Continue {CHL  HP Upstream Pharmacy OTLXB:[id number]}    Diabetes   Recent Relevant Labs: Combs Results  Component Value Date/Time   HGBA1C 6.4 05/25/2020 10:23 AM   HGBA1C 6.3 01/24/2020 09:24 AM   GFR 34.80 (L) 10/13/2019 11:24 AM   GFR 41.89 (L) 10/01/2018 10:36 AM   MICROALBUR 17.9 (H) 09/25/2016 09:11 AM   MICROALBUR 43.9 (H) 06/30/2014 04:01 PM     Checking BG: {CHL HP Blood Glucose Monitoring Frequency:[phone]}  Recent FBG Readings: *** Recent pre-meal BG readings: *** Recent 2hr PP BG readings:  *** Recent HS BG readings: ***  Patient has failed these meds in past: None Patient is currently {CHL Controlled/[phone]} on the following medications:  Marland Kitchen Metformin 500 mg 1 tablet twice daily with a meal  Last diabetic Eye exam:  Combs Results  Component Value Date/Time   HMDIABEYEEXA No Retinopathy 01/11/2020 12:00 AM    Last diabetic Foot exam: No results found for: HMDIABFOOTEX   We discussed: {CHL HP Upstream Pharmacy [phone]}  Plan  Continue {CHL HP Upstream Pharmacy Plans:[phone]}   Hypothyroidism   Combs Results  Component Value Date/Time   TSH 4.88 (H) 10/13/2019 11:24 AM   TSH 1.75 10/27/2018 10:58 AM   FREET4 0.86 10/27/2018 10:58 AM   FREET4 0.81 10/01/2017 10:18 AM    Patient has failed these meds in past: None Patient is currently {CHL Controlled/[phone]} on the following medications:  . Levothyroxine 100 mcg 1 tablet daily  We discussed:  {CHL HP Upstream Pharmacy [phone]}  Plan  Continue {CHL HP Upstream Pharmacy Plans:[phone]}   Mixed anxiety / depression   Patient has failed these meds in past: citalopram Patient is currently {CHL Controlled/[phone]} on the following medications:  . Sertraline 25 mg 1 tablet daily  We discussed:  ***  Plan  Continue {CHL HP Upstream Pharmacy Plans:[phone]}    Gout   Patient has failed these meds in past: colchicine    Patient is currently {CHL Controlled/[phone]} on the following medications:  . Allopurinol 100 mg 2 tablets daily  We discussed:  ***  Plan  Continue {CHL HP Upstream Pharmacy Plans:[phone]}    BPH   Patient has failed these meds in past: None Patient is currently {CHL Controlled/[phone]} on the following medications:  . Tamsulosin 0.4 mg 2 capsules daily  *** recently increased to 2 capsules daily. May need to add finasteride if symptoms persist.  Plan  Continue {CHL HP Upstream Pharmacy Plans:[phone]}   Neuropathy   Patient has failed these meds in past: None Patient is currently {CHL Controlled/[phone]} on the following medications:  . Gabapentin 100 mg 1 capsule 3 times daily  We discussed:  ***  Plan  Continue {CHL HP Upstream Pharmacy BULAG:[id number]}    Erectile Dysfunction   Patient has failed these meds in past: None Patient is currently {CHL Controlled/[phone]} on the following medications:  . Tadalafil 20 mg 1 tablet 30  mins prior to intercourse  We discussed:  ***  Plan  Continue {CHL HP Upstream Pharmacy XBWIO:[id number]}    OTCs/Health Maintenance   Patient is currently {CHL Controlled/Uncontrolled:[phone]} on the following medications: . Aspirin 81 mg 1 tablet daily . Vitamin D3 5000 IU 1 tablet daily . Fish oil 1000 mg 1 capsule daily  We discussed:  ***  Plan  Continue {CHL HP Upstream Pharmacy AGTXM:[id number]}    Vaccines   Reviewed and discussed patient's vaccination history.    Immunization History  Administered Date(s) Administered  . Influenza Whole 09/13/2008, 09/12/2009, 10/08/2010  . Influenza, High Dose Seasonal PF 12/05/2015, 08/07/2016, 08/27/2017  . Influenza,inj,Quad PF,6+ Mos 09/01/2014  . Influenza-Unspecified 09/24/2013, 07/25/2018  . Pneumococcal Conjugate-13 06/30/2014  . Pneumococcal Polysaccharide-23 09/26/2008, 09/29/2009,  12/05/2015  . Td 06/30/2014    Plan  Recommended patient receive *** vaccine in *** office.    Medication Management   Pharmacy/Benefits: [facility] / [facility] Adherence:  Pt endorses ***% compliance  We discussed: ***  Plan  {US Pharmacy MGNO:03704}   Follow up: *** month phone visit   Geraldine Contras, PharmD Clinical Pharmacist [facility] [place] [phone]

## 2020-06-05 ENCOUNTER — Ambulatory Visit: Payer: Medicare Other

## 2020-06-07 ENCOUNTER — Telehealth: Payer: Self-pay | Admitting: Adult Health

## 2020-06-07 NOTE — Progress Notes (Signed)
  Chronic Care Management   Outreach Note  06/07/2020 Name: NORVELL CASWELL MRN: [id number] DOB: 1946-07-29  Referred by: Dorothyann Peng, NP Reason for referral : No chief complaint on file.   An unsuccessful telephone outreach was attempted today. The patient was referred to the pharmacist for assistance with care management and care coordination.   Follow Up Plan:   [place]

## 2020-06-12 ENCOUNTER — Telehealth: Payer: Self-pay | Admitting: Adult Health

## 2020-06-12 NOTE — Progress Notes (Signed)
Please have patient call back: [phone]   Chronic Care Management   Outreach Note  06/12/2020 Name: Fernando Combs MRN: [id number] DOB: 1946-03-31  Referred by: Dorothyann Peng, NP Reason for referral : No chief complaint on file.   Third unsuccessful telephone outreach was attempted today. The patient was referred to the pharmacist for assistance with care management and care coordination.   Follow Up Plan:   [facility] Sched[facility]

## 2020-06-12 NOTE — Progress Notes (Signed)
Please have patient call back: [phone]   Chronic Care Management   Outreach Note  06/12/2020 Name: Fernando Combs MRN: [id number] DOB: 1946/09/03  Referred by: Dorothyann Peng, NP Reason for referral : No chief complaint on file.   Third unsuccessful telephone outreach was attempted today. The patient was referred to the pharmacist for assistance with care management and care coordination.   Follow Up Plan:   [facility] Scheduler    Follow up plan:   [place]

## 2020-06-13 ENCOUNTER — Other Ambulatory Visit: Payer: Self-pay | Admitting: Adult Health

## 2020-06-13 DIAGNOSIS — E1142 Type 2 diabetes mellitus with diabetic polyneuropathy: Secondary | ICD-10-CM

## 2020-06-13 NOTE — Progress Notes (Signed)
°  Chronic Care Management   Outreach Note  06/13/2020 Name: ANTOINNE SPADACCINI MRN: [id number] DOB: Jun 01, 1946  Referred by: Dorothyann Peng, NP Reason for referral : No chief complaint on file.   An unsuccessful telephone outreach was attempted today. The patient was referred to the pharmacist for assistance with care management and care coordination.   Follow Up Plan:   [facility] Scheduler

## 2020-06-14 NOTE — Telephone Encounter (Signed)
SENT TO THE PHARMACY BY E-SCRIBE. 

## 2020-06-15 ENCOUNTER — Other Ambulatory Visit: Payer: Self-pay

## 2020-06-15 ENCOUNTER — Encounter: Payer: Self-pay | Admitting: Podiatry

## 2020-06-15 ENCOUNTER — Ambulatory Visit: Payer: Medicare Other | Admitting: Podiatry

## 2020-06-15 DIAGNOSIS — L84 Corns and callosities: Secondary | ICD-10-CM | POA: Diagnosis not present

## 2020-06-15 DIAGNOSIS — M79674 Pain in right toe(s): Secondary | ICD-10-CM

## 2020-06-15 DIAGNOSIS — M79675 Pain in left toe(s): Secondary | ICD-10-CM

## 2020-06-15 DIAGNOSIS — E1142 Type 2 diabetes mellitus with diabetic polyneuropathy: Secondary | ICD-10-CM | POA: Diagnosis not present

## 2020-06-15 DIAGNOSIS — B351 Tinea unguium: Secondary | ICD-10-CM

## 2020-06-15 NOTE — Progress Notes (Signed)
Subjective: Fernando Combs presents today for follow up of at risk foot care with history of diabetic neuropathy. He is seen routinely for callus(es) b/l feet and painful mycotic toenails b/l that are difficult to trim. Pain interferes with ambulation. Aggravating factors include wearing enclosed shoe gear. Pain is relieved with periodic professional debridement.   Fernando Combs voices no new pedal concerns on today's visit.  He states he has been supporting his daughter who lives in [place] and is going to visit her soon.  No Known Allergies   Objective: There were no vitals filed for this visit. Pt is a pleasant  74 y.o. year old Caucasian male  in NAD. AAO x 3.   Vascular Examination:  Capillary fill time to digits <3 seconds b/l. Faintly palpable DP pulses b/l. Faintly palpable PT pulses b/l. Pedal hair absent b/l Skin temperature gradient within normal limits b/l. No edema noted b/l.  Dermatological Examination: Pedal skin with normal turgor, texture and tone bilaterally. No open wounds bilaterally. No interdigital macerations bilaterally. Toenails 1-5 b/l elongated, dystrophic, thickened, crumbly with subungual debris and tenderness to dorsal palpation. Hyperkeratotic lesion(s) submet head 1 right foot, submet head 2 left foot, submet head 4 right foot, submet head 5 left foot and submet head 5 right foot.  No erythema, no edema, no drainage, no flocculence.  Musculoskeletal: Normal muscle strength 5/5 to all lower extremity muscle groups bilaterally. No gross bony deformities bilaterally. No pain crepitus or joint limitation noted with ROM b/l.  Neurological: Protective sensation diminished with 10g monofilament b/l. Proprioception intact bilaterally. Babinski reflex negative b/l. Clonus negative b/l.  Assessment: 1. Pain due to onychomycosis of toenails of both feet   2. Callus   3. Diabetic peripheral neuropathy associated with type 2 diabetes mellitus ([place])    Plan: -Continue  diabetic foot care principles. Literature dispensed on today.  -Toenails 1-5 b/l were debrided in length and girth with sterile nail nippers and dremel without iatrogenic bleeding.  -Callus(es) submet head 1 right foot, submet head 2 left foot, submet head 4 right foot, submet head 5 left foot and submet head 5 right foot pared utilizing sterile scalpel blade without complication or incident. Total number debrided =5. -Patient to continue soft, supportive shoe gear daily. -Patient to report any pedal injuries to medical professional immediately. -[name] to call should there be question/concern in the interim.  Return in about 3 months (around 09/15/2020) for diabetic nail and callus trim.

## 2020-06-28 NOTE — Chronic Care Management (AMB) (Deleted)
Chronic Care Management Pharmacy  Name: Fernando Combs  MRN: [id number] DOB: [birth year]   Chief Complaint/ HPI  Fernando Combs,  74 y.o. , male presents for their Initial CCM visit with the clinical pharmacist via telephone.  PCP : Dorothyann Peng, NP Patient Care Team: Dorothyann Peng, NP as PCP - General (Family Medicine) Stanford Breed Denice Bors, MD as PCP - Cardiology (Cardiology) Troy Sine, MD as Consulting Physician (Cardiology) Gean Birchwood, DPM as Consulting Physician (Podiatry) Stanford Breed, Denice Bors, MD as Consulting Physician (Cardiology) Germaine Pomfret, [facility] [place] as Pharmacist (Pharmacist)  Their chronic conditions include: Hypertension, Hyperlipidemia, Diabetes, Coronary Artery Disease, Chronic Kidney Disease, Hypothyroidism, Depression, BPH and Gout   Office Visits: ***  Consult Visit: ***  No Known Allergies  Medications: Outpatient Encounter Medications as of 07/02/2020  Medication Sig  . allopurinol (ZYLOPRIM) 100 MG tablet TAKE 2 TABLETS BY MOUTH  DAILY  . amLODipine (NORVASC) 5 MG tablet TAKE 1 TABLET BY MOUTH  DAILY  . aspirin 81 MG chewable tablet Chew 1 tablet (81 mg total) by mouth daily.  Marland Kitchen atorvastatin (LIPITOR) 80 MG tablet TAKE 1 TABLET BY MOUTH  DAILY AT 6 P.M.  . carvedilol (COREG) 25 MG tablet TAKE 1 TABLET BY MOUTH  TWICE DAILY WITH MEALS  . Cholecalciferol (VITAMIN D-3) 125 MCG (5000 UT) TABS Take 1 tablet by mouth daily.  Marland Kitchen FLUAD 0.5 ML SUSY ADM 0.5ML IM UTD  . gabapentin (NEURONTIN) 100 MG capsule TAKE 1 CAPSULE BY MOUTH 3  TIMES DAILY  . isosorbide mononitrate (IMDUR) 60 MG 24 hr tablet TAKE 1 TABLET BY MOUTH  DAILY  . levothyroxine (SYNTHROID) 100 MCG tablet TAKE 1 TABLET BY MOUTH  DAILY  . metFORMIN (GLUCOPHAGE) 500 MG tablet TAKE 1 TABLET BY MOUTH 2 TIMES DAILY WITH A MEAL.  . nitroGLYCERIN (NITROSTAT) 0.4 MG SL tablet DISSOLVE 1 TABLET UNDER THE TONGUE EVERY 5 MINUTES AS  NEEDED FOR CHEST PAIN FOR  UP TO 3 DOSES. SEEK MEDICAL ATTENTION  IF NO RELIEF.  Marland Kitchen Omega-3 Fatty Acids (FISH OIL) 1000 MG CPDR Take by mouth.  . sertraline (ZOLOFT) 25 MG tablet TAKE 1 TABLET BY MOUTH  DAILY  . tadalafil (CIALIS) 20 MG tablet TAKE ONE TABLET BY MOUTH 30 MINUTES PRIOR TO INTERCOURSE.  . tamsulosin (FLOMAX) 0.4 MG CAPS capsule Take 2 capsules (0.8 mg total) by mouth daily.   No facility-administered encounter medications on file as of 07/02/2020.     Current Diagnosis/Assessment:    Goals Addressed   None    Diabetes   A1c goal {A1c goals:23924}  Recent Relevant Labs: Combs Results  Component Value Date/Time   HGBA1C 6.4 05/25/2020 10:23 AM   HGBA1C 6.3 01/24/2020 09:24 AM   GFR 34.80 (L) 10/13/2019 11:24 AM   GFR 41.89 (L) 10/01/2018 10:36 AM   MICROALBUR 17.9 (H) 09/25/2016 09:11 AM   MICROALBUR 43.9 (H) 06/30/2014 04:01 PM    Last diabetic Eye exam:  Combs Results  Component Value Date/Time   HMDIABEYEEXA No Retinopathy 01/11/2020 12:00 AM    Last diabetic Foot exam: No results found for: HMDIABFOOTEX   Checking BG: {CHL HP Blood Glucose Monitoring Frequency:[phone]}  Recent FBG Readings: *** Recent pre-meal BG readings: *** Recent 2hr PP BG readings:  *** Recent HS BG readings: ***  Patient has failed these meds in past: *** Patient is currently {CHL Controlled/[phone]} on the following medications: Marland Kitchen Metformin 500 mg twice daily   We discussed: {CHL HP Upstream Pharmacy [phone]}  Plan  Continue {CHL HP Upstream Pharmacy Plans:[phone]}  Hypertension   BP goal is:  {CHL HP UPSTREAM Pharmacist BP ranges:[phone]}  Office blood pressures are  BP Readings from Last 3 Encounters:  05/25/20 128/70  01/24/20 136/80  12/20/19 (!) 152/84   Patient checks BP at home {CHL HP BP Monitoring Frequency:[phone]} Patient home BP readings are ranging: ***  Patient has failed these meds in the past: *** Patient is currently {CHL Controlled/Uncontrolled:[phone]} on the  following medications:  . Amlodipine 5 mg daily  . Carvedilol 25 mg BID  . Imdur 60 mg daily   We discussed {CHL HP Upstream Pharmacy discussion:[phone]}  Plan  Continue {CHL HP Upstream Pharmacy Plans:[phone]}   Hyperlipidemia   LDL goal < ***  Lipid Panel     Component Value Date/Time   CHOL 106 10/13/2019 1124   TRIG 250.0 (H) 10/13/2019 1124   HDL 36.10 (L) 10/13/2019 1124   LDLCALC 29 10/01/2018 1036   LDLDIRECT 37.0 10/13/2019 1124    Hepatic Function Latest Ref Rng & Units 10/13/2019 10/01/2018 10/01/2017  Total Protein 6.0 - 8.3 g/dL 7.2 7.6 7.3  Albumin 3.5 - 5.2 g/dL 4.4 4.5 4.2  AST 0 - 37 U/L 20 16 14   ALT 0 - 53 U/L 24 18 16   Alk Phosphatase 39 - 117 U/L 74 85 88  Total Bilirubin 0.2 - 1.2 mg/dL 0.4 0.4 0.4  Bilirubin, Direct 0.0 - 0.3 mg/dL - 0.1 0.1     The ASCVD Risk score (Garrison., et al., 2013) failed to calculate for the following reasons:   The patient has a prior MI or stroke diagnosis   Patient has failed these meds in past: *** Patient is currently {CHL Controlled/Uncontrolled:[phone]} on the following medications:  . Aspirin 81 mg daily  . Atorvastatin 80 mg daily   We discussed:  {CHL HP Upstream Pharmacy discussion:[phone]}  Plan  Continue {CHL HP Upstream Pharmacy Plans:[phone]}  Hypothyroidism   Combs Results  Component Value Date/Time   TSH 4.88 (H) 10/13/2019 11:24 AM   TSH 1.75 10/27/2018 10:58 AM   FREET4 0.86 10/27/2018 10:58 AM   FREET4 0.81 10/01/2017 10:18 AM    Patient has failed these meds in past: *** Patient is currently {CHL Controlled/Uncontrolled:[phone]} on the following medications:  . Levothyroxine 100 mcg daily   We discussed:  {CHL HP Upstream Pharmacy discussion:[phone]}  Plan  Continue {CHL HP Upstream Pharmacy Plans:[phone]}  Gout   Uric Acid, Serum  Date Value Ref Range Status  06/30/2014 5.2 4.0 - 7.8 mg/dL Final  03/24/2014 5.7 4.0 - 7.8 mg/dL Final     Goal Uric  Acid < 6 mg/dL   Medications that may increase uric acid levels: {Gout meds:24107}  Last gout flare: ***  Patient has failed these meds in past: *** Patient is currently {CHL Controlled/Uncontrolled:[phone]} on the following medications:  . Allopurinol 100 mg 2 tablets daily   We discussed:  {gout counseling:24106}  Plan  Continue {CHL HP Upstream Pharmacy Plans:[phone]}  Depression / Anxiety   PHQ9 Score:  PHQ9 SCORE ONLY 10/01/2018 08/31/2018 08/27/2017  PHQ-9 Total Score 0 0 0   GAD7 Score: No flowsheet data found.  Patient has failed these meds in past: *** Patient is currently {CHL Controlled/Uncontrolled:[phone]} on the following medications:  . Sertraline 25 mg daily   We discussed:  ***  Plan  Continue {CHL HP Upstream Pharmacy IOMBT:[id number]}  BPH   PSA  Date Value Ref Range Status  10/13/2019 0.59 0.10 - 4.00 ng/mL Final  Comment:    Test performed using Access Hybritech PSA Assay, a parmagnetic partical, chemiluminecent immunoassay.  10/01/2018 0.26 0.10 - 4.00 ng/mL Final    Comment:    Test performed using Access Hybritech PSA Assay, a parmagnetic partical, chemiluminecent immunoassay.  10/01/2017 0.23 0.10 - 4.00 ng/mL Final    Comment:    Test performed using Access Hybritech PSA Assay, a parmagnetic partical, chemiluminecent immunoassay.     Patient has failed these meds in past: *** Patient is currently {CHL Controlled/Uncontrolled:4422730002} on the following medications:  . Tamsulosin 0.4 mg 2 caps daily   We discussed:  ***  Plan  Continue {CHL HP Upstream Pharmacy Plans:[phone]}   Misc / OTC   Patient has failed these meds in past: *** Patient is currently {CHL Controlled/Uncontrolled:4422730002} on the following medications:  Marland Kitchen Vitamin D3 5000 units daily  . Gabapentin 100 mg TID  . Nitroglycerin 0.4 mg SL  . Omega-3 Fish oil 1000 mg daily  . Tadalafil 20 mg daily PRN   We discussed:  ***  Plan  Continue  {CHL HP Upstream Pharmacy Plans:[phone]}   Medication Management   Pt [facility] for all medications Uses pill box? {Yes or If no, why not?:20788} Pt endorses ***% compliance  We discussed: ***  Plan  {US Pharmacy LKGM:01027}    Follow up: *** month phone visit  ***

## 2020-06-29 ENCOUNTER — Telehealth: Payer: Self-pay

## 2020-06-29 NOTE — Progress Notes (Signed)
Chronic Care Management Pharmacy Assistant   Name: Fernando Combs  MRN: [id number] DOB: Mar 02, 1946  Reason for Encounter: Medication Review/Intitial Question for visit with pharmacist    PCP : Dorothyann Peng, NP  Allergies:  No Known Allergies  Medications: Outpatient Encounter Medications as of 06/29/2020  Medication Sig  . allopurinol (ZYLOPRIM) 100 MG tablet TAKE 2 TABLETS BY MOUTH  DAILY  . amLODipine (NORVASC) 5 MG tablet TAKE 1 TABLET BY MOUTH  DAILY  . aspirin 81 MG chewable tablet Chew 1 tablet (81 mg total) by mouth daily.  Marland Kitchen atorvastatin (LIPITOR) 80 MG tablet TAKE 1 TABLET BY MOUTH  DAILY AT 6 P.M.  . carvedilol (COREG) 25 MG tablet TAKE 1 TABLET BY MOUTH  TWICE DAILY WITH MEALS  . Cholecalciferol (VITAMIN D-3) 125 MCG (5000 UT) TABS Take 1 tablet by mouth daily.  Marland Kitchen FLUAD 0.5 ML SUSY ADM 0.5ML IM UTD  . gabapentin (NEURONTIN) 100 MG capsule TAKE 1 CAPSULE BY MOUTH 3  TIMES DAILY  . isosorbide mononitrate (IMDUR) 60 MG 24 hr tablet TAKE 1 TABLET BY MOUTH  DAILY  . levothyroxine (SYNTHROID) 100 MCG tablet TAKE 1 TABLET BY MOUTH  DAILY  . metFORMIN (GLUCOPHAGE) 500 MG tablet TAKE 1 TABLET BY MOUTH 2 TIMES DAILY WITH A MEAL.  . nitroGLYCERIN (NITROSTAT) 0.4 MG SL tablet DISSOLVE 1 TABLET UNDER THE TONGUE EVERY 5 MINUTES AS  NEEDED FOR CHEST PAIN FOR  UP TO 3 DOSES. SEEK MEDICAL ATTENTION IF NO RELIEF.  Marland Kitchen Omega-3 Fatty Acids (FISH OIL) 1000 MG CPDR Take by mouth.  . sertraline (ZOLOFT) 25 MG tablet TAKE 1 TABLET BY MOUTH  DAILY  . tadalafil (CIALIS) 20 MG tablet TAKE ONE TABLET BY MOUTH 30 MINUTES PRIOR TO INTERCOURSE.  . tamsulosin (FLOMAX) 0.4 MG CAPS capsule Take 2 capsules (0.8 mg total) by mouth daily.   No facility-administered encounter medications on file as of 06/29/2020.    Current Diagnosis: Patient Active Problem List   Diagnosis Date Noted  . ST elevation myocardial infarction (STEMI) involving other coronary artery of anterior wall ([place]) 05/16/2015  . STEMI  (ST elevation myocardial infarction) ([place]) 05/15/2015  . Gout 03/03/2014  . Adjustment disorder with mixed anxiety and depressed mood 01/30/2014  . Occlusion and stenosis of carotid artery without mention of cerebral infarction 12/07/2012  . Hypothyroidism 12/15/2011  . Atherosclerosis of native artery of extremity with intermittent claudication ([place]) 11/10/2011  . UNSPECIFIED PERIPHERAL VASCULAR DISEASE 12/30/2010  . BACK PAIN, LEFT 12/30/2010  . Chronic kidney disease 11/04/2010  . SYNCOPE 10/02/2009  . Diabetic neuropathy ([place]) 04/02/2009  . EMBOLISM AND THROMBOSIS, ARTERY 08/15/2008  . DM (diabetes mellitus), type 2 with complications ([place]) 83/66/2947  . Hyperlipidemia 06/17/2007  . Depression, recurrent ([place]) 06/17/2007  . Essential hypertension 06/17/2007  . MYOCARDIAL INFARCTION, HX OF 06/17/2007  . CAD (coronary atherosclerotic disease) 06/17/2007  . ISCHEMIC COLITIS 06/17/2007  . BPH (benign prostatic hyperplasia) 06/17/2007    Goals Addressed   None     Follow-Up:  Pharmacist Review   Reviewed chart for medication changes ahead of medication coordination call.   OVs, Consults, or hospital visits since last care coordination call/Pharmacist visit. 06/15/2020- [facility]  No medication changes indicated OR if recent visit, treatment plan here.  BP Readings from Last 3 Encounters:  05/25/20 128/70  01/24/20 136/80  12/20/19 (!) 152/84    Lab Results  Component Value Date   HGBA1C 6.4 05/25/2020     Have you seen any  other providers since your last visit? Yes 06/15/2020 [facility]  Any changes in your medications or health? no Any side effects from any medications? no Do you have an symptoms or problems not managed by your medications? No Any concerns about your health right now? Yes  Patient stated that he is taking Zoloft 25 mg (been on it for years), would like to come off but he express that its to dangerous to come off  of Zoloft. Has your provider asked that you check bllood pressure, blood sugar, or follow special diet at home?   Patient stated that he does not eat anything with sugar. Do you get any type of exercise on a regular basis? no Can you think of a goal you would like to reach for your health? Patient stated he would like to  live pass 74th birthday.  Do you have any problems getting your medications? No, Patient stated  if he needs anything acute that it may take awhile unless it is sent to a local pharmacy for acute filled.  Is there anything that you would like to discuss during the appointment? Patient stated he would like to discuss if he could stop Zoloft.  Please bring medications and supplements to appointment  [facility] Pharmacist Assistant [phone]

## 2020-07-02 ENCOUNTER — Telehealth: Payer: Medicare Other

## 2020-07-02 DIAGNOSIS — H40013 Open angle with borderline findings, low risk, bilateral: Secondary | ICD-10-CM | POA: Diagnosis not present

## 2020-07-05 ENCOUNTER — Telehealth: Payer: Self-pay | Admitting: Cardiology

## 2020-07-05 NOTE — Telephone Encounter (Signed)
LVM for patient to return call to get follow up scheduled with [facility] from recall list

## 2020-07-09 ENCOUNTER — Other Ambulatory Visit: Payer: Self-pay | Admitting: Adult Health

## 2020-07-09 DIAGNOSIS — E038 Other specified hypothyroidism: Secondary | ICD-10-CM

## 2020-07-09 DIAGNOSIS — I10 Essential (primary) hypertension: Secondary | ICD-10-CM

## 2020-07-09 DIAGNOSIS — Z760 Encounter for issue of repeat prescription: Secondary | ICD-10-CM

## 2020-08-23 ENCOUNTER — Other Ambulatory Visit: Payer: Self-pay | Admitting: Adult Health

## 2020-08-23 DIAGNOSIS — E118 Type 2 diabetes mellitus with unspecified complications: Secondary | ICD-10-CM

## 2020-09-19 ENCOUNTER — Encounter: Payer: Self-pay | Admitting: Podiatry

## 2020-09-19 ENCOUNTER — Ambulatory Visit: Payer: Medicare Other | Admitting: Podiatry

## 2020-09-19 ENCOUNTER — Other Ambulatory Visit: Payer: Self-pay

## 2020-09-19 DIAGNOSIS — M79674 Pain in right toe(s): Secondary | ICD-10-CM

## 2020-09-19 DIAGNOSIS — M79675 Pain in left toe(s): Secondary | ICD-10-CM | POA: Diagnosis not present

## 2020-09-19 DIAGNOSIS — L84 Corns and callosities: Secondary | ICD-10-CM

## 2020-09-19 DIAGNOSIS — B351 Tinea unguium: Secondary | ICD-10-CM | POA: Diagnosis not present

## 2020-09-19 DIAGNOSIS — E1142 Type 2 diabetes mellitus with diabetic polyneuropathy: Secondary | ICD-10-CM | POA: Diagnosis not present

## 2020-09-22 NOTE — Progress Notes (Signed)
Subjective: Fernando Combs presents today for follow up of at risk foot care with history of diabetic neuropathy. He is seen routinely for callus(es) b/l feet and painful mycotic toenails b/l that are difficult to trim. Pain interferes with ambulation. Aggravating factors include wearing enclosed shoe gear. Pain is relieved with periodic professional debridement.   [name] voices no new pedal concerns on today's visit.  No Known Allergies   Objective: There were no vitals filed for this visit.   Pt is a pleasant  74 y.o. year old Caucasian male  in NAD. AAO x 3.   Vascular Examination:  Capillary fill time to digits <3 seconds b/l. Faintly palpable DP pulses b/l. Faintly palpable PT pulses b/l. Pedal hair absent b/l Skin temperature gradient within normal limits b/l. No edema noted b/l.  Dermatological Examination: Pedal skin with normal turgor, texture and tone bilaterally. No open wounds bilaterally. No interdigital macerations bilaterally. Toenails 1-5 b/l elongated, dystrophic, thickened, crumbly with subungual debris and tenderness to dorsal palpation. Hyperkeratotic lesion(s) submet head 1 right foot, submet head 2 left foot, submet head 4 right foot, submet head 5 left foot and submet head 5 right foot.  No erythema, no edema, no drainage, no flocculence.  Musculoskeletal: Normal muscle strength 5/5 to all lower extremity muscle groups bilaterally. No gross bony deformities bilaterally. No pain crepitus or joint limitation noted with ROM b/l.  Neurological: Protective sensation diminished with 10g monofilament b/l. Proprioception intact bilaterally. Babinski reflex negative b/l. Clonus negative b/l.  Assessment: 1. Pain due to onychomycosis of toenails of both feet   2. Callus   3. Diabetic peripheral neuropathy associated with type 2 diabetes mellitus ([place])     Plan: -Examined patient. -No new findings. No new orders. -Continue diabetic foot care principles. -Toenails  1-5 b/l were debrided in length and girth with sterile nail nippers and dremel without iatrogenic bleeding.  -Callus(es) submet head 1 right foot, submet head 2 left foot, submet head 4 right foot, submet head 5 left foot and submet head 5 right foot pared utilizing sterile scalpel blade without complication or incident. Total number debrided =5. -Patient to report any pedal injuries to medical professional immediately. -Patient to continue soft, supportive shoe gear daily. -[name] to call should there be question/concern in the interim.  Return in about 3 months (around 12/20/2020) for diabetic nail and callus trim.

## 2020-09-25 NOTE — Progress Notes (Deleted)
HPI: FU hx of HLD, HTN, DM2, CAD s/p CABG 2001 with LIMA to LAD, SVG to diagonal, SVG to OM, SVG to RPDA, carotid stenosis;CKD.  Admitted 6/16 with a lateral STEMI. LHC showed patent LIMA to LAD, SVG to OM, SVG to RPDA; SVG to diagonal was occluded with severe native D1 disease. He was noted to have 95% proximal LAD stenosis after proximal septal and prior to the takeoff of D1 branch, the diagonal branch was occluded at the proximal third of the vessel, LAD was occluded shortly after the diagonal takeoff, subtotal occlusion of the AV groove circumflex and total occlusion of the native mid RCA after anterior RV marginal branch. PTCA was performed on the very proximal LAD prior to the diagonal and LAD bifurcation with 95% stenosis being reduced down to 60%. There was unsuccessful attempt at wiring the diagonal vessel due to acute angle takeoff which was occluded at the proximal third segment and had been supplied by the vein graft. Medical therapy was recommended. Post PCI, he continued to have chest discomfort which worsened with weaning of IV nitroglycerin. He was placed on oral nitrates with resolution of symptoms. EF on left ventriculogram during cardiac catheterization was 35-40%.Last echocardiogram February 2021 showed ejection fraction 50 to 16%, grade 1 diastolic dysfunction, mild left ventricular enlargement, moderate left atrial enlargement, mild tricuspid regurgitation.  Monitor March 2021 showed sinus rhythm with PACs, brief PAT, PVCs and 8 beats nonsustained ventricular tachycardia.  Since last seen,  Current Outpatient Medications  Medication Sig Dispense Refill  . allopurinol (ZYLOPRIM) 100 MG tablet TAKE 2 TABLETS BY MOUTH  DAILY 180 tablet 3  . amLODipine (NORVASC) 5 MG tablet TAKE 1 TABLET BY MOUTH  DAILY 90 tablet 3  . aspirin 81 MG chewable tablet Chew 1 tablet (81 mg total) by mouth daily.    Marland Kitchen atorvastatin (LIPITOR) 80 MG tablet TAKE 1 TABLET BY MOUTH  DAILY AT 6 P.M.  90 tablet 0  . carvedilol (COREG) 25 MG tablet TAKE 1 TABLET BY MOUTH  TWICE DAILY WITH MEALS 180 tablet 3  . Cholecalciferol (VITAMIN D-3) 125 MCG (5000 UT) TABS Take 1 tablet by mouth daily. 90 tablet 3  . FLUAD 0.5 ML SUSY ADM 0.5ML IM UTD  0  . gabapentin (NEURONTIN) 100 MG capsule TAKE 1 CAPSULE BY MOUTH 3  TIMES DAILY 270 capsule 1  . isosorbide mononitrate (IMDUR) 60 MG 24 hr tablet TAKE 1 TABLET BY MOUTH  DAILY 90 tablet 0  . levothyroxine (SYNTHROID) 100 MCG tablet TAKE 1 TABLET BY MOUTH  DAILY 90 tablet 0  . metFORMIN (GLUCOPHAGE) 500 MG tablet TAKE 1 TABLET BY MOUTH 2 TIMES DAILY WITH A MEAL. 180 tablet 1  . nitroGLYCERIN (NITROSTAT) 0.4 MG SL tablet DISSOLVE 1 TABLET UNDER THE TONGUE EVERY 5 MINUTES AS  NEEDED FOR CHEST PAIN FOR  UP TO 3 DOSES. SEEK MEDICAL ATTENTION IF NO RELIEF. 90 tablet 3  . Omega-3 Fatty Acids (FISH OIL) 1000 MG CPDR Take by mouth.    . sertraline (ZOLOFT) 25 MG tablet TAKE 1 TABLET BY MOUTH  DAILY 90 tablet 0  . tadalafil (CIALIS) 20 MG tablet TAKE ONE TABLET BY MOUTH 30 MINUTES PRIOR TO INTERCOURSE. 30 tablet 5  . tamsulosin (FLOMAX) 0.4 MG CAPS capsule Take 0.8 mg by mouth daily.     No current facility-administered medications for this visit.     Past Medical History:  Diagnosis Date  . Benign prostatic hypertrophy   . CAD (  coronary artery disease)    cath 05/16/2015 patent LIMA to LAD, SVG to OM, SVG to RPDA, SVG to diagonal was occluded. PTCA 95% prox LAD reduce down to 60%. Medical management  . Carotid artery occlusion   . Colitis, ischemic ([place]) 06/2005    history of  . Diabetes mellitus type II   . Diabetes, polyneuropathy ([place])   . Diabetic peripheral neuropathy ([place])   . DVT (deep venous thrombosis) ([place])   . Gout   . History of myocardial infarction   . History of syncope    most likely vasovagal  . History of tobacco abuse   . Hyperlipidemia   . Hypertension   . Hypothyroidism   . Myocardial infarction ([place])   . Peripheral vascular  disease ([place])   . Shingles   . Thrombus 04/2008   acute thrombus of his right superficial artery with claudication    Past Surgical History:  Procedure Laterality Date  . CARDIAC CATHETERIZATION N/A 05/15/2015   Procedure: Left Heart Cath and Cors/Grafts Angiography;  Surgeon: Troy Sine, MD;  Location: [place] [facility];  Service: Cardiovascular;  Laterality: N/A;  . CATARACT EXTRACTION Bilateral   . CORONARY ARTERY BYPASS GRAFT  08/04/00   times 4 (left interanl mammary artery to the left anterior descending, saphenous vein,graft to diagonal, saphenous vein graft to obtuse marginal, spahenous vein graft to posterior descending-Surgeon  Tharon Aquas Tright III  . LOWER EXTREMITY ANGIOGRAM  March 09, 2012  . LOWER EXTREMITY ANGIOGRAM Right 03/09/2012   Procedure: LOWER EXTREMITY ANGIOGRAM;  Surgeon: Serafina Mitchell, MD;  Location: [facility] CATH LAB;  Service: Cardiovascular;  Laterality: Right;  . OTHER SURGICAL HISTORY  11/2010   angiogram, right leg stent placement-  -Dr Kellie Simmering  . peripheral vascular surgery    . PR VEIN BYPASS GRAFT,AORTO-FEM-POP  08/04/00    Social History   Socioeconomic History  . Marital status: Married    Spouse name: Not on file  . Number of children: Not on file  . Years of education: Not on file  . Highest education level: Not on file  Occupational History  . Not on file  Tobacco Use  . Smoking status: Former Smoker    Packs/day: 1.00    Years: 50.00    Pack years: 50.00    Types: Cigarettes    Quit date: 04/14/2011    Years since quitting: 9.4  . Smokeless tobacco: Never Used  Substance and Sexual Activity  . Alcohol use: No  . Drug use: No  . Sexual activity: Not on file  Other Topics Concern  . Not on file  Social History Narrative   Occupation:  Retired from [employer]    Married - lives with wife (2nd marriage)   3 children     Alcohol use-no   Tobacco Use - Quit smoking 3 years ago          Social Determinants of Adult nurse Strain:   . Difficulty of Paying Living Expenses: Not on file  Food Insecurity:   . Worried About Charity fundraiser in the Last Year: Not on file  . Ran Out of Food in the Last Year: Not on file  Transportation Needs:   . Lack of Transportation (Medical): Not on file  . Lack of Transportation (Non-Medical): Not on file  Physical Activity:   . Days of Exercise per Week: Not on file  . Minutes of Exercise per Session: Not on file  Stress:   . Feeling of Stress : Not on file  Social Connections:   . Frequency of Communication with Friends and Family: Not on file  . Frequency of Social Gatherings with Friends and Family: Not on file  . Attends Religious Services: Not on file  . Active Member of Clubs or Organizations: Not on file  . Attends Archivist Meetings: Not on file  . Marital Status: Not on file  Intimate Partner Violence:   . Fear of Current or Ex-Partner: Not on file  . Emotionally Abused: Not on file  . Physically Abused: Not on file  . Sexually Abused: Not on file    Family History  Problem Relation Age of Onset  . Deep vein thrombosis Mother        Varicose Veins  . Heart disease Mother   . Hyperlipidemia Mother   . Hypertension Mother   . Peripheral vascular disease Mother   . Lung cancer Mother   . Stroke Father   . Deep vein thrombosis Father   . Diabetes Father   . Heart disease Father        Heart Disease  before age 52  . Hyperlipidemia Father   . Hypertension Father   . Heart attack Father   . Peripheral vascular disease Father   . Alcohol abuse Unknown   . Stroke Unknown   . Coronary artery disease Unknown   . Hyperlipidemia Unknown   . Deep vein thrombosis Brother   . Diabetes Brother   . Heart disease Brother        Heart Disease before age 75  . Hyperlipidemia Brother   . Hypertension Brother   . Heart attack Brother   . Peripheral vascular disease Brother   . Lung cancer Sister   . Kidney cancer Brother         on hospice   . Other Neg Hx        denies family history of blood clots or blood disorder    ROS: no fevers or chills, productive cough, hemoptysis, dysphasia, odynophagia, melena, hematochezia, dysuria, hematuria, rash, seizure activity, orthopnea, PND, pedal edema, claudication. Remaining systems are negative.  Physical Exam: Well-developed well-nourished in no acute distress.  Skin is warm and dry.  HEENT is normal.  Neck is supple.  Chest is clear to auscultation with normal expansion.  Cardiovascular exam is regular rate and rhythm.  Abdominal exam nontender or distended. No masses palpated. Extremities show no edema. neuro grossly intact  ECG- personally reviewed  A/P  1 coronary artery disease-patient doing well with no chest pain.  Continue medical therapy with aspirin, statin, beta-blocker and nitrates.  2 hypertension-blood pressure controlled.  Continue present medications and follow.  3 hyperlipidemia-continue statin.  4 palpitations-symptoms have improved.  Continue beta-blocker.  Note recent echocardiogram showed preserved LV function.  5 carotid artery disease/peripheral vascular disease-managed by vascular surgery.  We will continue medical therapy with aspirin and statin.  Kirk Ruths, MD

## 2020-10-02 ENCOUNTER — Other Ambulatory Visit: Payer: Self-pay | Admitting: Adult Health

## 2020-10-02 DIAGNOSIS — E038 Other specified hypothyroidism: Secondary | ICD-10-CM

## 2020-10-02 DIAGNOSIS — I10 Essential (primary) hypertension: Secondary | ICD-10-CM

## 2020-10-02 DIAGNOSIS — Z760 Encounter for issue of repeat prescription: Secondary | ICD-10-CM

## 2020-10-03 ENCOUNTER — Ambulatory Visit: Payer: Medicare Other | Admitting: Cardiology

## 2020-10-12 ENCOUNTER — Other Ambulatory Visit: Payer: Self-pay

## 2020-10-12 ENCOUNTER — Encounter: Payer: Self-pay | Admitting: Adult Health

## 2020-10-12 ENCOUNTER — Ambulatory Visit (INDEPENDENT_AMBULATORY_CARE_PROVIDER_SITE_OTHER): Payer: Medicare Other | Admitting: Adult Health

## 2020-10-12 VITALS — BP 124/72 | HR 72 | Temp 97.6°F | Ht 69.0 in | Wt 229.2 lb

## 2020-10-12 DIAGNOSIS — N400 Enlarged prostate without lower urinary tract symptoms: Secondary | ICD-10-CM

## 2020-10-12 DIAGNOSIS — I10 Essential (primary) hypertension: Secondary | ICD-10-CM

## 2020-10-12 DIAGNOSIS — E1142 Type 2 diabetes mellitus with diabetic polyneuropathy: Secondary | ICD-10-CM

## 2020-10-12 DIAGNOSIS — Z0000 Encounter for general adult medical examination without abnormal findings: Secondary | ICD-10-CM

## 2020-10-12 DIAGNOSIS — I2510 Atherosclerotic heart disease of native coronary artery without angina pectoris: Secondary | ICD-10-CM | POA: Diagnosis not present

## 2020-10-12 DIAGNOSIS — F339 Major depressive disorder, recurrent, unspecified: Secondary | ICD-10-CM | POA: Diagnosis not present

## 2020-10-12 DIAGNOSIS — E118 Type 2 diabetes mellitus with unspecified complications: Secondary | ICD-10-CM

## 2020-10-12 MED ORDER — SERTRALINE HCL 50 MG PO TABS: 50.0000 mg | ORAL_TABLET | Freq: Every day | ORAL | 1 refills | Status: DC

## 2020-10-12 MED ORDER — NITROGLYCERIN 0.4 MG SL SUBL: SUBLINGUAL_TABLET | SUBLINGUAL | 3 refills | Status: AC

## 2020-10-12 MED ORDER — GABAPENTIN 300 MG PO CAPS: 300.0000 mg | ORAL_CAPSULE | Freq: Three times a day (TID) | ORAL | 1 refills | Status: DC

## 2020-10-12 NOTE — Progress Notes (Signed)
Subjective:    Patient ID: Fernando Combs, male    DOB: 25-Apr-1946, 74 y.o.   MRN: [id number]  HPI  Patient presents for yearly preventative medicine examination. He is a pleasant 74 year old male who  has a past medical history of Benign prostatic hypertrophy, CAD (coronary artery disease), Carotid artery occlusion, Colitis, ischemic ([place]) (06/2005), Diabetes mellitus type II, Diabetes, polyneuropathy ([place]), Diabetic peripheral neuropathy ([place]), DVT (deep venous thrombosis) ([place]), Gout, History of myocardial infarction, History of syncope, History of tobacco abuse, Hyperlipidemia, Hypertension, Hypothyroidism, Myocardial infarction [facility]), Peripheral vascular disease ([place]), Shingles, and Thrombus (04/2008).  DM -currently maintained on Metformin 500 mg twice daily.  He does not monitor his blood sugars at home.  He denies any symptoms of hypoglycemia.  He does not suffer from any side effects of metformin.   Diabetic Polyneuropathy -  He does take gabapentin 100 mg 3 times daily for diabetic neuropathy and feels as though this helps numb the discomfort in his lower extremities but does not completely resolve it.  He is wondering if he could go up a little bit on this dose Lab Results  Component Value Date   HGBA1C 6.4 05/25/2020   BPH -currently prescribed Flomax 0.8 mg daily.  Essential Hypertension -takes Norvasc 5 mg and Coreg 25 mg daily.  He denies dizziness, lightheadedness, chest pain, or shortness of breath  BP Readings from Last 3 Encounters:  10/12/20 124/72  05/25/20 128/70  01/24/20 136/80    CAD-is managed by cardiology and vascular.  He has a history of multiple stents.  Last cardiac cath was in 2016.  He denies exertional chest pain or significant shortness of breath, lower extremity claudication symptoms, or fatigue.  He is continued on aspirin 68m , Lipitor 80 mg, carvedilol, and Imdur 60 mg  Hypothyroidism - well controlled with Synthroid 88 mcg daily  Lab Results    Component Value Date   TSH 4.88 (H) 10/13/2019   Depression- is on Zoloft 25 mg daily. Feels as though he is more depressed lately and believes that could use an increase in his Zoloft.  He denies suicidal ideation  All immunizations and health maintenance protocols were reviewed with the patient and needed orders were placed.  Appropriate screening laboratory values were ordered for the patient including screening of hyperlipidemia, renal function and hepatic function. If indicated by BPH, a PSA was ordered.  Medication reconciliation,  past medical history, social history, problem list and allergies were reviewed in detail with the patient  Goals were established with regard to weight loss, exercise, and  diet in compliance with medications Lab Results  Component Value Date   CHOL 106 10/13/2019   HDL 36.10 (L) 10/13/2019   LDLCALC 29 10/01/2018   LDLDIRECT 37.0 10/13/2019   TRIG 250.0 (H) 10/13/2019   CHOLHDL 3 10/13/2019   End of life planning was discussed.   Review of Systems  Constitutional: Negative.   HENT: Negative.   Eyes: Negative.   Respiratory: Negative.   Cardiovascular: Negative.   Gastrointestinal: Negative.   Endocrine: Negative.   Genitourinary: Negative.   Musculoskeletal: Negative.   Skin: Negative.   Allergic/Immunologic: Negative.   Neurological: Negative.   Hematological: Negative.   Psychiatric/Behavioral: Negative.   All other systems reviewed and are negative.  Past Medical History:  Diagnosis Date   Benign prostatic hypertrophy    CAD (coronary artery disease)    cath 05/16/2015 patent LIMA to LAD, SVG to OM, SVG to RPDA, SVG to diagonal  was occluded. PTCA 95% prox LAD reduce down to 60%. Medical management   Carotid artery occlusion    Colitis, ischemic ([place]) 06/2005    history of   Diabetes mellitus type II    Diabetes, polyneuropathy ([facility])    Diabetic peripheral neuropathy ([facility])    DVT (deep venous thrombosis) ([facility])    Gout     History of myocardial infarction    History of syncope    most likely vasovagal   History of tobacco abuse    Hyperlipidemia    Hypertension    Hypothyroidism    Myocardial infarction [facility])    Peripheral vascular disease ([place])    Shingles    Thrombus 04/2008   acute thrombus of his right superficial artery with claudication    Social History   Socioeconomic History   Marital status: Married    Spouse name: Not on file   Number of children: Not on file   Years of education: Not on file   Highest education level: Not on file  Occupational History   Not on file  Tobacco Use   Smoking status: Former Smoker    Packs/day: 1.00    Years: 50.00    Pack years: 50.00    Types: Cigarettes    Quit date: 04/14/2011    Years since quitting: 9.5   Smokeless tobacco: Never Used  Substance and Sexual Activity   Alcohol use: No   Drug use: No   Sexual activity: Not on file  Other Topics Concern   Not on file  Social History Narrative   Occupation:  Retired from [employer] home [employer]    Married - lives with wife (2nd marriage)   3 children     Alcohol use-no   Tobacco Use - Quit smoking 3 years ago          Social Determinants of Radio broadcast assistant Strain:    Difficulty of Paying Living Expenses: Not on file  Food Insecurity:    Worried About Charity fundraiser in the Last Year: Not on file   [employer] of Food in the Last Year: Not on file  Transportation Needs:    Lack of Transportation (Medical): Not on file   Lack of Transportation (Non-Medical): Not on file  Physical Activity:    Days of Exercise per Week: Not on file   Minutes of Exercise per Session: Not on file  Stress:    Feeling of Stress : Not on file  Social Connections:    Frequency of Communication with Friends and Family: Not on file   Frequency of Social Gatherings with Friends and Family: Not on file   Attends Religious Services: Not on file   Active Member of  Clubs or Organizations: Not on file   Attends Archivist Meetings: Not on file   Marital Status: Not on file  Intimate Partner Violence:    Fear of Current or Ex-Partner: Not on file   Emotionally Abused: Not on file   Physically Abused: Not on file   Sexually Abused: Not on file    Past Surgical History:  Procedure Laterality Date   CARDIAC CATHETERIZATION N/A 05/15/2015   Procedure: Left Heart Cath and Cors/Grafts Angiography;  Surgeon: Troy Sine, MD;  Location: [place] [facility];  Service: Cardiovascular;  Laterality: N/A;   CATARACT EXTRACTION Bilateral    CORONARY ARTERY BYPASS GRAFT  08/04/00   times 4 (left interanl mammary artery to the left anterior descending, saphenous  vein,graft to diagonal, saphenous vein graft to obtuse marginal, spahenous vein graft to posterior descending-Surgeon  Tharon Aquas Tright III   LOWER EXTREMITY ANGIOGRAM  March 09, 2012   LOWER EXTREMITY ANGIOGRAM Right 03/09/2012   Procedure: LOWER EXTREMITY ANGIOGRAM;  Surgeon: Serafina Mitchell, MD;  Location: [facility] CATH LAB;  Service: Cardiovascular;  Laterality: Right;   OTHER SURGICAL HISTORY  11/2010   angiogram, right leg stent placement-  -Dr Kellie Simmering   peripheral vascular surgery     PR VEIN BYPASS GRAFT,AORTO-FEM-POP  08/04/00    Family History  Problem Relation Age of Onset   Deep vein thrombosis Mother        Varicose Veins   Heart disease Mother    Hyperlipidemia Mother    Hypertension Mother    Peripheral vascular disease Mother    Lung cancer Mother    Stroke Father    Deep vein thrombosis Father    Diabetes Father    Heart disease Father        Heart Disease  before age 23   Hyperlipidemia Father    Hypertension Father    Heart attack Father    Peripheral vascular disease Father    Alcohol abuse Unknown    Stroke Unknown    Coronary artery disease Unknown    Hyperlipidemia Unknown    Deep vein thrombosis Brother    Diabetes Brother     Heart disease Brother        Heart Disease before age 67   Hyperlipidemia Brother    Hypertension Brother    Heart attack Brother    Peripheral vascular disease Brother    Lung cancer Sister    Kidney cancer Brother        on hospice    Other Neg Hx        denies family history of blood clots or blood disorder    No Known Allergies  Current Outpatient Medications on File Prior to Visit  Medication Sig Dispense Refill   allopurinol (ZYLOPRIM) 100 MG tablet TAKE 2 TABLETS BY MOUTH  DAILY 180 tablet 3   amLODipine (NORVASC) 5 MG tablet TAKE 1 TABLET BY MOUTH  DAILY 90 tablet 3   aspirin 81 MG chewable tablet Chew 1 tablet (81 mg total) by mouth daily.     atorvastatin (LIPITOR) 80 MG tablet TAKE 1 TABLET BY MOUTH  DAILY AT 6 P.M. 90 tablet 3   carvedilol (COREG) 25 MG tablet TAKE 1 TABLET BY MOUTH  TWICE DAILY WITH MEALS 180 tablet 3   Cholecalciferol (VITAMIN D-3) 125 MCG (5000 UT) TABS Take 1 tablet by mouth daily. 90 tablet 3   FLUAD 0.5 ML SUSY ADM 0.5ML IM UTD  0   gabapentin (NEURONTIN) 100 MG capsule TAKE 1 CAPSULE BY MOUTH 3  TIMES DAILY 270 capsule 1   isosorbide mononitrate (IMDUR) 60 MG 24 hr tablet TAKE 1 TABLET BY MOUTH  DAILY 90 tablet 3   levothyroxine (SYNTHROID) 100 MCG tablet TAKE 1 TABLET BY MOUTH  DAILY 90 tablet 3   metFORMIN (GLUCOPHAGE) 500 MG tablet TAKE 1 TABLET BY MOUTH 2 TIMES DAILY WITH A MEAL. 180 tablet 1   nitroGLYCERIN (NITROSTAT) 0.4 MG SL tablet DISSOLVE 1 TABLET UNDER THE TONGUE EVERY 5 MINUTES AS  NEEDED FOR CHEST PAIN FOR  UP TO 3 DOSES. SEEK MEDICAL ATTENTION IF NO RELIEF. 90 tablet 3   Omega-3 Fatty Acids (FISH OIL) 1000 MG CPDR Take by mouth.     sertraline (ZOLOFT) 25  MG tablet TAKE 1 TABLET BY MOUTH  DAILY 90 tablet 3   tadalafil (CIALIS) 20 MG tablet TAKE ONE TABLET BY MOUTH 30 MINUTES PRIOR TO INTERCOURSE. 30 tablet 5   tamsulosin (FLOMAX) 0.4 MG CAPS capsule Take 0.8 mg by mouth daily.     No current  facility-administered medications on file prior to visit.    BP 124/72    Pulse 72    Temp 97.6 F (36.4 C) (Oral)    Ht _0  (1.753 m)    Wt 229 lb 3.2 oz (104 kg)    SpO2 96%    BMI 33.85 kg/m       Objective:   Physical Exam Vitals and nursing note reviewed.  Constitutional:      General: He is not in acute distress.    Appearance: Normal appearance. He is well-developed and normal weight.  HENT:     Head: Normocephalic and atraumatic.     Right Ear: Tympanic membrane, ear canal and external ear normal. There is no impacted cerumen.     Left Ear: Tympanic membrane, ear canal and external ear normal. There is no impacted cerumen.     Nose: Nose normal. No congestion or rhinorrhea.     Mouth/Throat:     Mouth: Mucous membranes are moist.     Pharynx: Oropharynx is clear. No oropharyngeal exudate or posterior oropharyngeal erythema.  Eyes:     General:        Right eye: No discharge.        Left eye: No discharge.     Extraocular Movements: Extraocular movements intact.     Conjunctiva/sclera: Conjunctivae normal.     Pupils: Pupils are equal, round, and reactive to light.  Neck:     Vascular: No carotid bruit.     Trachea: No tracheal deviation.  Cardiovascular:     Rate and Rhythm: Normal rate and regular rhythm.     Pulses: Normal pulses.     Heart sounds: Normal heart sounds. No murmur heard.  No friction rub. No gallop.   Pulmonary:     Effort: Pulmonary effort is normal. No respiratory distress.     Breath sounds: Normal breath sounds. No stridor. No wheezing, rhonchi or rales.  Chest:     Chest wall: No tenderness.  Abdominal:     General: Bowel sounds are normal. There is no distension.     Palpations: Abdomen is soft. There is no mass.     Tenderness: There is no abdominal tenderness. There is no right CVA tenderness, left CVA tenderness, guarding or rebound.     Hernia: No hernia is present.  Musculoskeletal:        General: No swelling, tenderness,  deformity or signs of injury. Normal range of motion.     Right lower leg: No edema.     Left lower leg: No edema.  Lymphadenopathy:     Cervical: No cervical adenopathy.  Skin:    General: Skin is warm and dry.     Capillary Refill: Capillary refill takes less than 2 seconds.     Coloration: Skin is not jaundiced or pale.     Findings: No bruising, erythema, lesion or rash.  Neurological:     General: No focal deficit present.     Mental Status: He is alert and oriented to person, place, and time.     Cranial Nerves: No cranial nerve deficit.     Sensory: No sensory deficit.     Motor: No  weakness.     Coordination: Coordination normal.     Gait: Gait normal.     Deep Tendon Reflexes: Reflexes normal.  Psychiatric:        Mood and Affect: Mood normal.        Behavior: Behavior normal.        Thought Content: Thought content normal.        Judgment: Judgment normal.       Assessment & Plan:  1. Routine general medical examination at a health care facility -Encouraged heart healthy diet and exercise. -Follow-up in 1 year or sooner if needed - CBC with Differential/Platelet; Future - Hemoglobin A1c; Future - Lipid panel; Future - TSH; Future - CMP with eGFR([facility]); Future - VITAMIN D 25 Hydroxy (Vit-D Deficiency, Fractures); Future - CBC with Differential/Platelet - Hemoglobin A1c - Lipid panel - TSH - CMP with eGFR([facility]) - VITAMIN D 25 Hydroxy (Vit-D Deficiency, Fractures)  2. Atherosclerosis of native coronary artery of native heart without angina pectoris -Follow-up with cardiology has he has not done this yet this year - CBC with Differential/Platelet; Future - Hemoglobin A1c; Future - Lipid panel; Future - TSH; Future - CMP with eGFR([facility]); Future - CBC with Differential/Platelet - Hemoglobin A1c - Lipid panel - TSH - CMP with eGFR([facility]) - nitroGLYCERIN (NITROSTAT) 0.4 MG SL tablet; DISSOLVE 1 TABLET UNDER THE TONGUE EVERY 5 MINUTES AS  NEEDED FOR CHEST  PAIN FOR  UP TO 3 DOSES. SEEK MEDICAL ATTENTION IF NO RELIEF.  Dispense: 90 tablet; Refill: 3  3. DM (diabetes mellitus), type 2 with complications ([place]) -Consider adding agent.  Follow-up in 6 months likely - CBC with Differential/Platelet; Future - Hemoglobin A1c; Future - Lipid panel; Future - TSH; Future - CMP with eGFR([facility]); Future - CBC with Differential/Platelet - Hemoglobin A1c - Lipid panel - TSH - CMP with eGFR([facility])  4. Benign prostatic hyperplasia without lower urinary tract symptoms  - PSA; Future - PSA  5. Essential hypertension -Well-controlled - CBC with Differential/Platelet; Future - Hemoglobin A1c; Future - Lipid panel; Future - TSH; Future - CMP with eGFR([facility]); Future - CBC with Differential/Platelet - Hemoglobin A1c - Lipid panel - TSH - CMP with eGFR([facility])  6. Depression, recurrent ([place]) -We will increase Zoloft to 50 mg daily.  Advise follow-up if not noticing any improvement in the next 2 to 4 weeks - sertraline (ZOLOFT) 50 MG tablet; Take 1 tablet (50 mg total) by mouth daily.  Dispense: 90 tablet; Refill: 1  7. Diabetic polyneuropathy associated with type 2 diabetes mellitus ([facility]) - Will increase to 300 mg TID to see if we can get him more relief  - CBC with Differential/Platelet; Future - Hemoglobin A1c; Future - Lipid panel; Future - TSH; Future - CMP with eGFR([facility]); Future - CBC with Differential/Platelet - Hemoglobin A1c - Lipid panel - TSH - CMP with eGFR([facility]) - gabapentin (NEURONTIN) 300 MG capsule; Take 1 capsule (300 mg total) by mouth 3 (three) times daily.  Dispense: 270 capsule; Refill: 1   Dorothyann Peng, NP

## 2020-10-13 LAB — COMPLETE METABOLIC PANEL WITH GFR
AG Ratio: 1.4 (calc) (ref 1.0–2.5)
ALT: 15 U/L (ref 9–46)
AST: 14 U/L (ref 10–35)
Albumin: 4.3 g/dL (ref 3.6–5.1)
Alkaline phosphatase (APISO): 88 U/L (ref 35–144)
BUN/Creatinine Ratio: 12 (calc) (ref 6–22)
BUN: 24 mg/dL (ref 7–25)
CO2: 25 mmol/L (ref 20–32)
Calcium: 9.7 mg/dL (ref 8.6–10.3)
Chloride: 104 mmol/L (ref 98–110)
Creat: 2.03 mg/dL — ABNORMAL HIGH (ref 0.70–1.18)
GFR, Est African American: 36 mL/min/{1.73_m2} — ABNORMAL LOW (ref >=60)
GFR, Est Non African American: 31 mL/min/{1.73_m2} — ABNORMAL LOW (ref >=60)
Globulin: 3 g/dL (calc) (ref 1.9–3.7)
Glucose, Bld: 160 mg/dL — ABNORMAL HIGH (ref 65–99)
Potassium: 5 mmol/L (ref 3.5–5.3)
Sodium: 140 mmol/L (ref 135–146)
Total Bilirubin: 0.4 mg/dL (ref 0.2–1.2)
Total Protein: 7.3 g/dL (ref 6.1–8.1)

## 2020-10-13 LAB — CBC WITH DIFFERENTIAL/PLATELET
Absolute Monocytes: 614 cells/uL (ref 200–950)
Basophils Absolute: 53 cells/uL (ref 0–200)
Basophils Relative: 0.6 %
Eosinophils Absolute: 303 cells/uL (ref 15–500)
Eosinophils Relative: 3.4 %
HCT: 42.3 % (ref 38.5–50.0)
Hemoglobin: 13.8 g/dL (ref 13.2–17.1)
Lymphs Abs: 1593 cells/uL (ref 850–3900)
MCH: 28.5 pg (ref 27.0–33.0)
MCHC: 32.6 g/dL (ref 32.0–36.0)
MCV: 87.4 fL (ref 80.0–100.0)
MPV: 9.9 fL (ref 7.5–12.5)
Monocytes Relative: 6.9 %
Neutro Abs: 6337 cells/uL (ref 1500–7800)
Neutrophils Relative %: 71.2 %
Platelets: 252 10*3/uL (ref 140–400)
RBC: 4.84 10*6/uL (ref 4.20–5.80)
RDW: 14.7 % (ref 11.0–15.0)
Total Lymphocyte: 17.9 %
WBC: 8.9 10*3/uL (ref 3.8–10.8)

## 2020-10-13 LAB — PSA: PSA: 0.35 ng/mL (ref <=4.0)

## 2020-10-13 LAB — HEMOGLOBIN A1C
Hgb A1c MFr Bld: 6.6 % of total Hgb — ABNORMAL HIGH (ref <5.7)
Mean Plasma Glucose: 143 (calc)
eAG (mmol/L): 7.9 (calc)

## 2020-10-13 LAB — VITAMIN D 25 HYDROXY (VIT D DEFICIENCY, FRACTURES): Vit D, 25-Hydroxy: 52 ng/mL (ref 30–100)

## 2020-10-13 LAB — LIPID PANEL
Cholesterol: 98 mg/dL (ref <200)
HDL: 40 mg/dL (ref >=40)
LDL Cholesterol (Calc): 37 mg/dL (calc)
Non-HDL Cholesterol (Calc): 58 mg/dL (calc) (ref <130)
Total CHOL/HDL Ratio: 2.5 (calc) (ref <5.0)
Triglycerides: 127 mg/dL (ref <150)

## 2020-10-13 LAB — TSH: TSH: 5.15 mIU/L — ABNORMAL HIGH (ref 0.40–4.50)

## 2020-10-17 ENCOUNTER — Telehealth: Payer: Self-pay | Admitting: Adult Health

## 2020-10-17 DIAGNOSIS — N1832 Chronic kidney disease, stage 3b: Secondary | ICD-10-CM

## 2020-10-17 MED ORDER — LEVOTHYROXINE SODIUM 112 MCG PO TABS: 112.0000 ug | ORAL_TABLET | Freq: Every day | ORAL | 3 refills | Status: DC

## 2020-10-17 NOTE — Telephone Encounter (Signed)
Patient is returning the call for lab results, please advise. CB is [phone]

## 2020-10-17 NOTE — Telephone Encounter (Signed)
Dated patient on his labs, his TSH was slightly elevated in the 5 range he is taking his medication on a daily basis, will increase his Synthroid from 100 to 112 mcg  Concerning was his kidney function which has decreased over the last year, creatinine 2.03 with a GFR of 31.  He is not taking any anti-inflammatories.  Will send him to nephrology for further evaluation

## 2020-10-30 ENCOUNTER — Other Ambulatory Visit: Payer: Self-pay | Admitting: Adult Health

## 2020-11-01 DIAGNOSIS — N1832 Chronic kidney disease, stage 3b: Secondary | ICD-10-CM | POA: Diagnosis not present

## 2020-11-01 DIAGNOSIS — I129 Hypertensive chronic kidney disease with stage 1 through stage 4 chronic kidney disease, or unspecified chronic kidney disease: Secondary | ICD-10-CM | POA: Diagnosis not present

## 2020-11-01 DIAGNOSIS — I2510 Atherosclerotic heart disease of native coronary artery without angina pectoris: Secondary | ICD-10-CM | POA: Diagnosis not present

## 2020-11-01 DIAGNOSIS — R809 Proteinuria, unspecified: Secondary | ICD-10-CM | POA: Diagnosis not present

## 2020-11-01 DIAGNOSIS — R42 Dizziness and giddiness: Secondary | ICD-10-CM | POA: Diagnosis not present

## 2020-11-07 ENCOUNTER — Other Ambulatory Visit: Payer: Self-pay | Admitting: Nephrology

## 2020-11-08 ENCOUNTER — Other Ambulatory Visit: Payer: Self-pay | Admitting: Nephrology

## 2020-11-08 DIAGNOSIS — I129 Hypertensive chronic kidney disease with stage 1 through stage 4 chronic kidney disease, or unspecified chronic kidney disease: Secondary | ICD-10-CM

## 2020-11-08 DIAGNOSIS — N1832 Chronic kidney disease, stage 3b: Secondary | ICD-10-CM

## 2020-11-08 DIAGNOSIS — R42 Dizziness and giddiness: Secondary | ICD-10-CM

## 2020-11-08 DIAGNOSIS — R809 Proteinuria, unspecified: Secondary | ICD-10-CM

## 2020-11-08 DIAGNOSIS — I2510 Atherosclerotic heart disease of native coronary artery without angina pectoris: Secondary | ICD-10-CM

## 2020-11-28 ENCOUNTER — Other Ambulatory Visit: Payer: Self-pay | Admitting: Adult Health

## 2020-11-28 DIAGNOSIS — E118 Type 2 diabetes mellitus with unspecified complications: Secondary | ICD-10-CM

## 2020-11-28 NOTE — Telephone Encounter (Signed)
SENT TO THE PHARMACY BY E-SCRIBE. 

## 2020-12-07 ENCOUNTER — Ambulatory Visit
Admission: RE | Admit: 2020-12-07 | Discharge: 2020-12-07 | Disposition: A | Discharge status: Home / Self Care | Payer: Medicare Other | Source: Ambulatory Visit | Attending: Nephrology | Admitting: Nephrology

## 2020-12-07 DIAGNOSIS — I129 Hypertensive chronic kidney disease with stage 1 through stage 4 chronic kidney disease, or unspecified chronic kidney disease: Secondary | ICD-10-CM | POA: Diagnosis not present

## 2020-12-07 DIAGNOSIS — N2889 Other specified disorders of kidney and ureter: Secondary | ICD-10-CM | POA: Diagnosis not present

## 2020-12-07 DIAGNOSIS — N1832 Chronic kidney disease, stage 3b: Secondary | ICD-10-CM

## 2020-12-07 DIAGNOSIS — R42 Dizziness and giddiness: Secondary | ICD-10-CM

## 2020-12-07 DIAGNOSIS — I2510 Atherosclerotic heart disease of native coronary artery without angina pectoris: Secondary | ICD-10-CM

## 2020-12-07 DIAGNOSIS — R809 Proteinuria, unspecified: Secondary | ICD-10-CM

## 2020-12-07 DIAGNOSIS — N189 Chronic kidney disease, unspecified: Secondary | ICD-10-CM | POA: Diagnosis not present

## 2020-12-13 ENCOUNTER — Other Ambulatory Visit: Payer: Self-pay

## 2020-12-13 ENCOUNTER — Other Ambulatory Visit: Payer: Self-pay | Admitting: Nephrology

## 2020-12-13 DIAGNOSIS — N281 Cyst of kidney, acquired: Secondary | ICD-10-CM

## 2020-12-27 ENCOUNTER — Encounter: Payer: Self-pay | Admitting: Cardiology

## 2020-12-28 ENCOUNTER — Other Ambulatory Visit: Payer: Self-pay

## 2020-12-28 ENCOUNTER — Ambulatory Visit: Payer: Medicare Other | Admitting: Adult Health

## 2020-12-28 ENCOUNTER — Encounter: Payer: Self-pay | Admitting: Podiatry

## 2020-12-28 ENCOUNTER — Ambulatory Visit: Payer: Medicare Other | Admitting: Podiatry

## 2020-12-28 DIAGNOSIS — B351 Tinea unguium: Secondary | ICD-10-CM

## 2020-12-28 DIAGNOSIS — L84 Corns and callosities: Secondary | ICD-10-CM

## 2020-12-28 DIAGNOSIS — E1142 Type 2 diabetes mellitus with diabetic polyneuropathy: Secondary | ICD-10-CM

## 2020-12-28 DIAGNOSIS — M79674 Pain in right toe(s): Secondary | ICD-10-CM | POA: Diagnosis not present

## 2020-12-28 DIAGNOSIS — M79675 Pain in left toe(s): Secondary | ICD-10-CM | POA: Diagnosis not present

## 2020-12-28 DIAGNOSIS — H40013 Open angle with borderline findings, low risk, bilateral: Secondary | ICD-10-CM | POA: Diagnosis not present

## 2021-01-01 ENCOUNTER — Encounter: Payer: Self-pay | Admitting: Adult Health

## 2021-01-01 ENCOUNTER — Ambulatory Visit (INDEPENDENT_AMBULATORY_CARE_PROVIDER_SITE_OTHER): Payer: Medicare Other | Admitting: Adult Health

## 2021-01-01 ENCOUNTER — Other Ambulatory Visit: Payer: Self-pay

## 2021-01-01 VITALS — BP 150/86 | Temp 97.9°F | Wt 225.0 lb

## 2021-01-01 DIAGNOSIS — N1832 Chronic kidney disease, stage 3b: Secondary | ICD-10-CM | POA: Diagnosis not present

## 2021-01-01 DIAGNOSIS — E1142 Type 2 diabetes mellitus with diabetic polyneuropathy: Secondary | ICD-10-CM

## 2021-01-01 DIAGNOSIS — F339 Major depressive disorder, recurrent, unspecified: Secondary | ICD-10-CM | POA: Diagnosis not present

## 2021-01-01 DIAGNOSIS — E118 Type 2 diabetes mellitus with unspecified complications: Secondary | ICD-10-CM

## 2021-01-01 DIAGNOSIS — E038 Other specified hypothyroidism: Secondary | ICD-10-CM | POA: Diagnosis not present

## 2021-01-01 LAB — BASIC METABOLIC PANEL
BUN: 29 mg/dL — ABNORMAL HIGH (ref 6–23)
CO2: 29 mEq/L (ref 19–32)
Calcium: 9.8 mg/dL (ref 8.4–10.5)
Chloride: 102 mEq/L (ref 96–112)
Creatinine, Ser: 2.36 mg/dL — ABNORMAL HIGH (ref 0.40–1.50)
GFR: 26.28 mL/min — ABNORMAL LOW (ref >60.00)
Glucose, Bld: 125 mg/dL — ABNORMAL HIGH (ref 70–99)
Potassium: 4.8 mEq/L (ref 3.5–5.1)
Sodium: 141 mEq/L (ref 135–145)

## 2021-01-01 LAB — HEMOGLOBIN A1C: Hgb A1c MFr Bld: 6.7 % — ABNORMAL HIGH (ref 4.6–6.5)

## 2021-01-01 LAB — TSH: TSH: 3.25 u[IU]/mL (ref 0.35–4.50)

## 2021-01-01 NOTE — Progress Notes (Addendum)
Subjective:    Patient ID: Fernando Combs, male    DOB: [birth year], 75 y.o.   MRN: [id number]  HPI 75 year old male who  has a past medical history of Benign prostatic hypertrophy, CAD (coronary artery disease), Carotid artery occlusion, Colitis, ischemic ([place]) (06/2005), Diabetes mellitus type II, Diabetes, polyneuropathy ([place]), Diabetic peripheral neuropathy ([place]), DVT (deep venous thrombosis) ([place]), Gout, History of myocardial infarction, History of syncope, History of tobacco abuse, Hyperlipidemia, Hypertension, Hypothyroidism, Myocardial infarction [facility]), Peripheral vascular disease ([place]), Shingles, and Thrombus (04/2008).  He presents to the office today for follow up.   During his physical exam in November 2021 his TSH was slightly abnormal with a reading of 5.15.  Increase his Synthroid from 100 mcg to 112 mcg.  He would like to retest today  He has a history of polyneuropathy from diabetes.  At the time of this physical he was taking gabapentin 100 mg 3 times daily which she reported helped but did not resolve his symptoms.  We decided to go up to 300 mg 3 times daily to see if he can get more relief. He does report that he has had some additional relief with the increase and has actually cut back on his dose to 300 mg BID.   Additionally, he felt as though his depression was getting worse as of lately and Zoloft was increased from 25 mg daily to 50 mg daily. He does report that his mood has improved since starting the 50 mg dose.   He reports that since he was last seen here he has been seen by Nephrology and has an MRI scheduled to evaluate a cyst that has grown from hs previous renal US in 2016. Nephrology also stopped Norvasc and placed on Farxiga 10 mg   IMPRESSION: 1. Echogenic cortex consistent with medical renal disease. No hydronephrosis. 2. Slight interval increase in size of a hypoechoic mass within the lower pole of the left kidney, potentially complex cyst (favored given  only slight increase in size since 2016), though on some of the images solid lesion cannot be excluded. If clinically able, further evaluation with renal CT or MRI could be obtained, otherwise six-month sonographic follow-up would be recommended.   Review of Systems See HPI   Past Medical History:  Diagnosis Date  . Benign prostatic hypertrophy   . CAD (coronary artery disease)    cath 05/16/2015 patent LIMA to LAD, SVG to OM, SVG to RPDA, SVG to diagonal was occluded. PTCA 95% prox LAD reduce down to 60%. Medical management  . Carotid artery occlusion   . Colitis, ischemic ([place]) 06/2005    history of  . Diabetes mellitus type II   . Diabetes, polyneuropathy ([place])   . Diabetic peripheral neuropathy ([place])   . DVT (deep venous thrombosis) ([place])   . Gout   . History of myocardial infarction   . History of syncope    most likely vasovagal  . History of tobacco abuse   . Hyperlipidemia   . Hypertension   . Hypothyroidism   . Myocardial infarction ([place])   . Peripheral vascular disease ([place])   . Shingles   . Thrombus 04/2008   acute thrombus of his right superficial artery with claudication    Social History   Socioeconomic History  . Marital status: Married    Spouse name: Not on file  . Number of children: Not on file  . Years of education: Not on file  . Highest education level: Not on  file  Occupational History  . Not on file  Tobacco Use  . Smoking status: Former Smoker    Packs/day: 1.00    Years: 50.00    Pack years: 50.00    Types: Cigarettes    Quit date: 04/14/2011    Years since quitting: 9.7  . Smokeless tobacco: Never Used  Substance and Sexual Activity  . Alcohol use: No  . Drug use: No  . Sexual activity: Not on file  Other Topics Concern  . Not on file  Social History Narrative   Occupation:  Retired from [employer]    Married - lives with wife (2nd marriage)   3 children     Alcohol use-no   Tobacco Use - Quit smoking 3 years ago           Social Determinants of Radio broadcast assistant Strain: Not on file  Food Insecurity: Not on file  Transportation Needs: Not on file  Physical Activity: Not on file  Stress: Not on file  Social Connections: Not on file  Intimate Partner Violence: Not on file    Past Surgical History:  Procedure Laterality Date  . CARDIAC CATHETERIZATION N/A 05/15/2015   Procedure: Left Heart Cath and Cors/Grafts Angiography;  Surgeon: Troy Sine, MD;  Location: [place] [facility];  Service: Cardiovascular;  Laterality: N/A;  . CATARACT EXTRACTION Bilateral   . CORONARY ARTERY BYPASS GRAFT  08/04/00   times 4 (left interanl mammary artery to the left anterior descending, saphenous vein,graft to diagonal, saphenous vein graft to obtuse marginal, spahenous vein graft to posterior descending-Surgeon  Tharon Aquas Tright III  . LOWER EXTREMITY ANGIOGRAM  March 09, 2012  . LOWER EXTREMITY ANGIOGRAM Right 03/09/2012   Procedure: LOWER EXTREMITY ANGIOGRAM;  Surgeon: Serafina Mitchell, MD;  Location: [facility] CATH LAB;  Service: Cardiovascular;  Laterality: Right;  . OTHER SURGICAL HISTORY  11/2010   angiogram, right leg stent placement-  -Dr Kellie Simmering  . peripheral vascular surgery    . PR VEIN BYPASS GRAFT,AORTO-FEM-POP  08/04/00    Family History  Problem Relation Age of Onset  . Deep vein thrombosis Mother        Varicose Veins  . Heart disease Mother   . Hyperlipidemia Mother   . Hypertension Mother   . Peripheral vascular disease Mother   . Lung cancer Mother   . Stroke Father   . Deep vein thrombosis Father   . Diabetes Father   . Heart disease Father        Heart Disease  before age 26  . Hyperlipidemia Father   . Hypertension Father   . Heart attack Father   . Peripheral vascular disease Father   . Alcohol abuse Unknown   . Stroke Unknown   . Coronary artery disease Unknown   . Hyperlipidemia Unknown   . Deep vein thrombosis Brother   . Diabetes Brother   . Heart disease Brother         Heart Disease before age 13  . Hyperlipidemia Brother   . Hypertension Brother   . Heart attack Brother   . Peripheral vascular disease Brother   . Lung cancer Sister   . Kidney cancer Brother        on hospice   . Other Neg Hx        denies family history of blood clots or blood disorder    No Known Allergies  Current Outpatient Medications on File Prior to Visit  Medication  Sig Dispense Refill  . amLODipine (NORVASC) 5 MG tablet TAKE 1 TABLET BY MOUTH  DAILY 90 tablet 3  . aspirin 81 MG chewable tablet Chew 1 tablet (81 mg total) by mouth daily.    Marland Kitchen atorvastatin (LIPITOR) 80 MG tablet TAKE 1 TABLET BY MOUTH  DAILY AT 6 P.M. 90 tablet 3  . carvedilol (COREG) 25 MG tablet TAKE 1 TABLET BY MOUTH  TWICE DAILY WITH MEALS 180 tablet 3  . Cholecalciferol (VITAMIN D-3) 125 MCG (5000 UT) TABS Take 1 tablet by mouth daily. 90 tablet 3  . FARXIGA 10 MG TABS tablet Take 10 mg by mouth daily.    Marland Kitchen FLUAD 0.5 ML SUSY ADM 0.5ML IM UTD  0  . gabapentin (NEURONTIN) 300 MG capsule Take 1 capsule (300 mg total) by mouth 3 (three) times daily. 270 capsule 1  . isosorbide mononitrate (IMDUR) 60 MG 24 hr tablet TAKE 1 TABLET BY MOUTH  DAILY 90 tablet 3  . levothyroxine (SYNTHROID) 112 MCG tablet Take 1 tablet (112 mcg total) by mouth daily. 90 tablet 3  . metFORMIN (GLUCOPHAGE) 500 MG tablet TAKE 1 TABLET BY MOUTH  TWICE DAILY WITH MEALS 180 tablet 0  . nitroGLYCERIN (NITROSTAT) 0.4 MG SL tablet DISSOLVE 1 TABLET UNDER THE TONGUE EVERY 5 MINUTES AS  NEEDED FOR CHEST PAIN FOR  UP TO 3 DOSES. SEEK MEDICAL ATTENTION IF NO RELIEF. 90 tablet 3  . Omega-3 Fatty Acids (FISH OIL) 1000 MG CPDR Take by mouth.    . sertraline (ZOLOFT) 50 MG tablet Take 1 tablet (50 mg total) by mouth daily. 90 tablet 1  . tadalafil (CIALIS) 20 MG tablet TAKE ONE TABLET BY MOUTH 30 MINUTES PRIOR TO INTERCOURSE. 30 tablet 5  . tamsulosin (FLOMAX) 0.4 MG CAPS capsule TAKE 2 CAPSULES BY MOUTH  DAILY 180 capsule 3   No current  facility-administered medications on file prior to visit.    BP (!) 150/86   Temp 97.9 F (36.6 C)   Wt 225 lb (102.1 kg)   BMI 33.23 kg/m       Objective:   Physical Exam Vitals and nursing note reviewed.  Constitutional:      Appearance: Normal appearance.  Cardiovascular:     Rate and Rhythm: Normal rate and regular rhythm.     Pulses: Normal pulses.     Heart sounds: Normal heart sounds.  Pulmonary:     Effort: Pulmonary effort is normal.     Breath sounds: Normal breath sounds.  Skin:    General: Skin is warm and dry.     Capillary Refill: Capillary refill takes less than 2 seconds.  Neurological:     General: No focal deficit present.     Mental Status: He is alert and oriented to person, place, and time.  Psychiatric:        Mood and Affect: Mood normal.        Behavior: Behavior normal.        Thought Content: Thought content normal.        Judgment: Judgment normal.       Assessment & Plan:  1. Other specified hypothyroidism  - TSH; Future - Consider increase in synthroid  2. Stage 3b chronic kidney disease (HCC) - low sodium and stay away from Nsaids  - Basic Metabolic Panel; Future  3. DM (diabetes mellitus), type 2 with complications (HCC)  - Hemoglobin A1c; Future  4. Depression, recurrent ([place]) - Continue with Zoloft 50 mg daily.   5.  Diabetic polyneuropathy associated with type 2 diabetes mellitus ([place]) - I am happy that he has had some improvement in discomfort. Continue with 300 mg BID dosing of gabapentin    Dorothyann Peng, NP

## 2021-01-01 NOTE — Addendum Note (Signed)
Addended by: Marrion Coy on: 01/01/2021 01:34 PM   Modules accepted: Orders

## 2021-01-03 ENCOUNTER — Other Ambulatory Visit: Payer: Self-pay | Admitting: Adult Health

## 2021-01-03 MED ORDER — GABAPENTIN 100 MG PO CAPS: 100.0000 mg | ORAL_CAPSULE | Freq: Three times a day (TID) | ORAL | 1 refills | Status: AC

## 2021-01-03 NOTE — Progress Notes (Signed)
Subjective: Fernando Combs presents today for follow up of at risk foot care with history of diabetic neuropathy. He is seen routinely for callus(es) b/l feet and painful mycotic toenails b/l that are difficult to trim. Pain interferes with ambulation. Aggravating factors include wearing enclosed shoe gear. Pain is relieved with periodic professional debridement.   He states his blood glucose was 140 mg/dl this morning.  No Known Allergies   Objective: There were no vitals filed for this visit.   Pt is a pleasant  75 y.o. year old Caucasian male  in NAD. AAO x 3.   Vascular Examination:  Capillary fill time to digits <3 seconds b/l. Faintly palpable DP pulses b/l. Faintly palpable PT pulses b/l. Pedal hair absent b/l Skin temperature gradient within normal limits b/l. No edema noted b/l.  Dermatological Examination: Pedal skin with normal turgor, texture and tone bilaterally. No open wounds bilaterally. No interdigital macerations bilaterally. Toenails 1-5 b/l elongated, discolored, dystrophic, thickened, crumbly with subungual debris and tenderness to dorsal palpation. Hyperkeratotic lesion(s) submet head 5 left foot, submet head 5 right foot and plantar aspect b/l heel pads.  No erythema, no edema, no drainage, no fluctuance.  Musculoskeletal: Normal muscle strength 5/5 to all lower extremity muscle groups bilaterally. No gross bony deformities bilaterally. No pain crepitus or joint limitation noted with ROM b/l.  Neurological: Protective sensation diminished with 10g monofilament b/l. Proprioception intact bilaterally. Babinski reflex negative b/l. Clonus negative b/l.  Assessment: 1. Pain due to onychomycosis of toenails of both feet   2. Callus   3. Diabetic peripheral neuropathy associated with type 2 diabetes mellitus ([name])     Plan: -Examined patient. -No new findings. No new orders. -Continue diabetic foot care principles. -Toenails 1-5 b/l were debrided in length and girth  with sterile nail nippers and dremel without iatrogenic bleeding.  -Callus(es) submet head 5 left foot, submet head 5 right foot and plantar aspect b/l heel pads pared utilizing sterile scalpel blade without complication or incident. Total number debrided =4. -Patient to report any pedal injuries to medical professional immediately.  Return in about 3 months (around 03/27/2021).

## 2021-01-20 ENCOUNTER — Ambulatory Visit
Admission: RE | Admit: 2021-01-20 | Discharge: 2021-01-20 | Disposition: A | Discharge status: Home / Self Care | Payer: Medicare Other | Source: Ambulatory Visit | Attending: Nephrology | Admitting: Nephrology

## 2021-01-20 ENCOUNTER — Other Ambulatory Visit: Payer: Self-pay

## 2021-01-20 DIAGNOSIS — K7689 Other specified diseases of liver: Secondary | ICD-10-CM | POA: Diagnosis not present

## 2021-01-20 DIAGNOSIS — N281 Cyst of kidney, acquired: Secondary | ICD-10-CM | POA: Diagnosis not present

## 2021-01-20 MED ORDER — GADOBENATE DIMEGLUMINE 529 MG/ML IV SOLN
20.0000 mL | Freq: Once | INTRAVENOUS | Status: AC | PRN
  Administered 2021-01-20: 20 mL via INTRAVENOUS

## 2021-01-25 DIAGNOSIS — R809 Proteinuria, unspecified: Secondary | ICD-10-CM | POA: Diagnosis not present

## 2021-01-25 DIAGNOSIS — I2510 Atherosclerotic heart disease of native coronary artery without angina pectoris: Secondary | ICD-10-CM | POA: Diagnosis not present

## 2021-01-25 DIAGNOSIS — N1832 Chronic kidney disease, stage 3b: Secondary | ICD-10-CM | POA: Diagnosis not present

## 2021-01-25 DIAGNOSIS — R42 Dizziness and giddiness: Secondary | ICD-10-CM | POA: Diagnosis not present

## 2021-01-25 DIAGNOSIS — R001 Bradycardia, unspecified: Secondary | ICD-10-CM | POA: Diagnosis not present

## 2021-01-25 DIAGNOSIS — N281 Cyst of kidney, acquired: Secondary | ICD-10-CM | POA: Diagnosis not present

## 2021-01-25 DIAGNOSIS — I129 Hypertensive chronic kidney disease with stage 1 through stage 4 chronic kidney disease, or unspecified chronic kidney disease: Secondary | ICD-10-CM | POA: Diagnosis not present

## 2021-02-26 ENCOUNTER — Other Ambulatory Visit: Payer: Self-pay | Admitting: Adult Health

## 2021-02-26 DIAGNOSIS — F339 Major depressive disorder, recurrent, unspecified: Secondary | ICD-10-CM

## 2021-03-01 ENCOUNTER — Telehealth: Payer: Self-pay | Admitting: Adult Health

## 2021-03-01 NOTE — Telephone Encounter (Signed)
Tried calling patient to  schedule Medicare Annual Wellness Visit (AWV) either virtually or in office.  No answer   Last AWV ;09/10/18  please schedule at anytime with [facility] Nurse Health Advisor 1 or 2   This should be a 45 minute visit.

## 2021-03-28 DIAGNOSIS — N1832 Chronic kidney disease, stage 3b: Secondary | ICD-10-CM | POA: Diagnosis not present

## 2021-03-28 LAB — BASIC METABOLIC PANEL
BUN: 27 — AB (ref 4–21)
CO2: 24 — AB (ref 13–22)
Chloride: 106 (ref 99–108)
Creatinine: 1.9 — AB (ref 0.6–1.3)
Glucose: 153
Potassium: 4.2 (ref 3.4–5.3)
Sodium: 140 (ref 137–147)

## 2021-03-28 LAB — COMPREHENSIVE METABOLIC PANEL
Albumin: 4.3 (ref 3.5–5.0)
Calcium: 9.6 (ref 8.7–10.7)
GFR calc non Af Amer: 36

## 2021-04-02 DIAGNOSIS — N281 Cyst of kidney, acquired: Secondary | ICD-10-CM | POA: Diagnosis not present

## 2021-04-02 DIAGNOSIS — R809 Proteinuria, unspecified: Secondary | ICD-10-CM | POA: Diagnosis not present

## 2021-04-02 DIAGNOSIS — N1832 Chronic kidney disease, stage 3b: Secondary | ICD-10-CM | POA: Diagnosis not present

## 2021-04-02 DIAGNOSIS — I129 Hypertensive chronic kidney disease with stage 1 through stage 4 chronic kidney disease, or unspecified chronic kidney disease: Secondary | ICD-10-CM | POA: Diagnosis not present

## 2021-04-09 ENCOUNTER — Encounter: Payer: Self-pay | Admitting: Adult Health

## 2021-04-12 ENCOUNTER — Ambulatory Visit (INDEPENDENT_AMBULATORY_CARE_PROVIDER_SITE_OTHER): Payer: Medicare Other | Admitting: Podiatry

## 2021-04-12 ENCOUNTER — Encounter: Payer: Self-pay | Admitting: Podiatry

## 2021-04-12 ENCOUNTER — Other Ambulatory Visit: Payer: Self-pay

## 2021-04-12 DIAGNOSIS — M79674 Pain in right toe(s): Secondary | ICD-10-CM

## 2021-04-12 DIAGNOSIS — L84 Corns and callosities: Secondary | ICD-10-CM

## 2021-04-12 DIAGNOSIS — M79675 Pain in left toe(s): Secondary | ICD-10-CM

## 2021-04-12 DIAGNOSIS — E1142 Type 2 diabetes mellitus with diabetic polyneuropathy: Secondary | ICD-10-CM | POA: Diagnosis not present

## 2021-04-12 DIAGNOSIS — N1832 Chronic kidney disease, stage 3b: Secondary | ICD-10-CM | POA: Diagnosis not present

## 2021-04-12 DIAGNOSIS — B351 Tinea unguium: Secondary | ICD-10-CM

## 2021-04-12 NOTE — Progress Notes (Signed)
  Subjective:  Patient ID: RAIDEN HAYDU, male    DOB: 1946-05-06,  MRN: [id number]  75 y.o. male presents at risk foot care with history of diabetic neuropathy and callus(es) b/l feet and painful thick toenails that are difficult to trim. Painful toenails interfere with ambulation. Aggravating factors include wearing enclosed shoe gear. Pain is relieved with periodic professional debridement. Painful calluses are aggravated when weightbearing with and without shoegear. Pain is relieved with periodic professional debridement.   He voices no new pedal concerns on today's visit. States he does not check his blood glucose daily since his A1c <7.0  PCP is [facility] and last visit was 01/01/2021.  No Known Allergies  Review of Systems: Negative except as noted in the HPI.   Objective:   Constitutional Pt is a pleasant 75 y.o. Caucasian male in NAD. AAO x 3.   Vascular Capillary refill time to digits immediate b/l. Palpable pedal pulses b/l LE. Pedal hair absent. Lower extremity skin temperature gradient within normal limits. No pain with calf compression b/l. No edema noted b/l lower extremities.  Neurologic Protective sensation diminished with 10g monofilament b/l.  Dermatologic Pedal skin with normal turgor, texture and tone bilaterally. No open wounds bilaterally. No interdigital macerations bilaterally. Toenails 1-5 b/l elongated, discolored, dystrophic, thickened, crumbly with subungual debris and tenderness to dorsal palpation. Hyperkeratotic lesion(s) submet head 5 left foot and submet head 5 right foot.  No erythema, no edema, no drainage, no fluctuance.  Orthopedic: Normal muscle strength 5/5 to all lower extremity muscle groups bilaterally. No pain crepitus or joint limitation noted with ROM b/l. No gross bony deformities bilaterally. Patient ambulates independent of any assistive aids.   Radiographs: None Assessment:   1. Pain due to onychomycosis of toenails of both feet   2.  Callus   3. Diabetic peripheral neuropathy associated with type 2 diabetes mellitus ([place])    Plan:  Patient was evaluated and treated and all questions answered.  Onychomycosis with pain -Nails palliatively debridement as below. -Educated on self-care  Procedure: Nail Debridement Rationale: Pain Type of Debridement: manual, sharp debridement. Instrumentation: Nail nipper, rotary burr. Number of Nails: 10  -Examined patient. -Continue diabetic foot care principles. -Patient to continue soft, supportive shoe gear daily. -Toenails 1-5 b/l were debrided in length and girth with sterile nail nippers and dremel without iatrogenic bleeding.  -Callus(es) submet head 5 left foot and submet head 5 right foot pared utilizing sterile scalpel blade without complication or incident. Total number debrided =2. -Patient to report any pedal injuries to medical professional immediately. -[name] to call should there be question/concern in the interim.  Return in about 3 months (around 07/13/2021).  Marzetta Board, DPM

## 2021-05-01 ENCOUNTER — Other Ambulatory Visit: Payer: Self-pay | Admitting: Adult Health

## 2021-05-01 DIAGNOSIS — E118 Type 2 diabetes mellitus with unspecified complications: Secondary | ICD-10-CM

## 2021-05-10 ENCOUNTER — Telehealth: Payer: Self-pay | Admitting: Adult Health

## 2021-05-10 NOTE — Telephone Encounter (Signed)
Left message for patient to call back and schedule Medicare Annual Wellness Visit (AWV) either virtually or in office.   Last AWV 08/31/18  please schedule at anytime with [facility] Nurse Health Advisor 1 or 2   This should be a 45 minute visit.

## 2021-06-10 ENCOUNTER — Telehealth: Payer: Self-pay | Admitting: Adult Health

## 2021-06-10 NOTE — Telephone Encounter (Signed)
Correction.  Tried calling patient  no answer phone kept disconnecting

## 2021-06-10 NOTE — Telephone Encounter (Signed)
Left message for patient to call back and schedule Medicare Annual Wellness Visit (AWV) either virtually or in office.   Last AWV 08/31/18  please schedule at anytime with [facility] Nurse Health Advisor 1 or 2   This should be a 45 minute visit.

## 2021-06-26 ENCOUNTER — Encounter: Payer: Self-pay | Admitting: Podiatry

## 2021-06-26 ENCOUNTER — Other Ambulatory Visit: Payer: Self-pay

## 2021-06-26 ENCOUNTER — Ambulatory Visit: Payer: Medicare Other | Admitting: Podiatry

## 2021-06-26 DIAGNOSIS — E119 Type 2 diabetes mellitus without complications: Secondary | ICD-10-CM | POA: Diagnosis not present

## 2021-06-26 DIAGNOSIS — L84 Corns and callosities: Secondary | ICD-10-CM | POA: Diagnosis not present

## 2021-06-26 DIAGNOSIS — M79674 Pain in right toe(s): Secondary | ICD-10-CM | POA: Diagnosis not present

## 2021-06-26 DIAGNOSIS — M2041 Other hammer toe(s) (acquired), right foot: Secondary | ICD-10-CM | POA: Diagnosis not present

## 2021-06-26 DIAGNOSIS — E1142 Type 2 diabetes mellitus with diabetic polyneuropathy: Secondary | ICD-10-CM

## 2021-06-26 DIAGNOSIS — M79675 Pain in left toe(s): Secondary | ICD-10-CM | POA: Diagnosis not present

## 2021-06-26 DIAGNOSIS — B351 Tinea unguium: Secondary | ICD-10-CM

## 2021-06-26 DIAGNOSIS — M2042 Other hammer toe(s) (acquired), left foot: Secondary | ICD-10-CM

## 2021-06-26 NOTE — Progress Notes (Signed)
ANNUAL DIABETIC FOOT EXAM  Subjective: Fernando Combs presents today for for annual diabetic foot examination and callus(es) of both feet and painful thick toenails that are difficult to trim. Painful toenails interfere with ambulation. Aggravating factors include wearing enclosed shoe gear. Pain is relieved with periodic professional debridement. Painful calluses are aggravated when weightbearing with and without shoegear. Pain is relieved with periodic professional debridement..  Patient relates 50 year h/o diabetes.  Patient denies any h/o foot wounds.  Patient has been diagnosed with neuropathy and takes gabapentin for management of symptoms.  Patient's blood sugar was 120 mg/dl this morning.  Dorothyann Peng, NP is patient's PCP. Last visit was 01/01/2021.  Past Medical History:  Diagnosis Date   Benign prostatic hypertrophy    CAD (coronary artery disease)    cath 05/16/2015 patent LIMA to LAD, SVG to OM, SVG to RPDA, SVG to diagonal was occluded. PTCA 95% prox LAD reduce down to 60%. Medical management   Carotid artery occlusion    Colitis, ischemic ([place]) 06/2005    history of   Diabetes mellitus type II    Diabetes, polyneuropathy ([place])    Diabetic peripheral neuropathy ([place])    DVT (deep venous thrombosis) (HCC)    Gout    History of myocardial infarction    History of syncope    most likely vasovagal   History of tobacco abuse    Hyperlipidemia    Hypertension    Hypothyroidism    Myocardial infarction [facility])    Peripheral vascular disease ([place])    Shingles    Thrombus 04/2008   acute thrombus of his right superficial artery with claudication   Patient Active Problem List   Diagnosis Date Noted   ST elevation myocardial infarction (STEMI) involving other coronary artery of anterior wall ([place]) 05/16/2015   STEMI (ST elevation myocardial infarction) ([place]) 05/15/2015   Gout 03/03/2014   Adjustment disorder with mixed anxiety and depressed mood 01/30/2014   Occlusion  and stenosis of carotid artery without mention of cerebral infarction 12/07/2012   Hypothyroidism 12/15/2011   Atherosclerosis of native artery of extremity with intermittent claudication ([place]) 11/10/2011   UNSPECIFIED PERIPHERAL VASCULAR DISEASE 12/30/2010   BACK PAIN, LEFT 12/30/2010   Chronic kidney disease 11/04/2010   SYNCOPE 10/02/2009   Diabetic neuropathy ([place]) 04/02/2009   EMBOLISM AND THROMBOSIS, ARTERY 08/15/2008   DM (diabetes mellitus), type 2 with complications ([place]) 123456   Hyperlipidemia 06/17/2007   Depression, recurrent ([place]) 06/17/2007   Essential hypertension 06/17/2007   MYOCARDIAL INFARCTION, HX OF 06/17/2007   CAD (coronary atherosclerotic disease) 06/17/2007   ISCHEMIC COLITIS 06/17/2007   BPH (benign prostatic hyperplasia) 06/17/2007   Past Surgical History:  Procedure Laterality Date   CARDIAC CATHETERIZATION N/A 05/15/2015   Procedure: Left Heart Cath and Cors/Grafts Angiography;  Surgeon: Troy Sine, MD;  Location: [place] CV LAB;  Service: Cardiovascular;  Laterality: N/A;   CATARACT EXTRACTION Bilateral    CORONARY ARTERY BYPASS GRAFT  08/04/00   times 4 (left interanl mammary artery to the left anterior descending, saphenous vein,graft to diagonal, saphenous vein graft to obtuse marginal, spahenous vein graft to posterior descending-Surgeon  Tharon Aquas Tright III   LOWER EXTREMITY ANGIOGRAM  March 09, 2012   LOWER EXTREMITY ANGIOGRAM Right 03/09/2012   Procedure: LOWER EXTREMITY ANGIOGRAM;  Surgeon: Serafina Mitchell, MD;  Location: [facility] CATH LAB;  Service: Cardiovascular;  Laterality: Right;   OTHER SURGICAL HISTORY  11/2010   angiogram, right leg stent placement-  -Dr Kellie Simmering  peripheral vascular surgery     PR VEIN BYPASS GRAFT,AORTO-FEM-POP  08/04/00   Current Outpatient Medications on File Prior to Visit  Medication Sig Dispense Refill   allopurinol (ZYLOPRIM) 100 MG tablet Take 200 mg by mouth daily.     amLODipine (NORVASC) 5 MG tablet TAKE  1 TABLET BY MOUTH  DAILY (Patient not taking: Reported on 01/01/2021) 90 tablet 3   aspirin 81 MG chewable tablet Chew 1 tablet (81 mg total) by mouth daily.     atorvastatin (LIPITOR) 80 MG tablet TAKE 1 TABLET BY MOUTH  DAILY AT 6 P.M. 90 tablet 3   carvedilol (COREG) 25 MG tablet TAKE 1 TABLET BY MOUTH  TWICE DAILY WITH MEALS 180 tablet 3   Cholecalciferol (VITAMIN D-3) 125 MCG (5000 UT) TABS Take 1 tablet by mouth daily. 90 tablet 3   FARXIGA 10 MG TABS tablet Take 10 mg by mouth daily.     FLUAD 0.5 ML SUSY ADM 0.5ML IM UTD  0   gabapentin (NEURONTIN) 100 MG capsule Take 1 capsule (100 mg total) by mouth 3 (three) times daily. 270 capsule 1   gabapentin (NEURONTIN) 300 MG capsule Take 300 mg by mouth 3 (three) times daily.     isosorbide mononitrate (IMDUR) 60 MG 24 hr tablet TAKE 1 TABLET BY MOUTH  DAILY 90 tablet 3   JARDIANCE 10 MG TABS tablet Take 10 mg by mouth daily.     levothyroxine (SYNTHROID) 112 MCG tablet Take 1 tablet (112 mcg total) by mouth daily. 90 tablet 3   losartan (COZAAR) 25 MG tablet Take 25 mg by mouth daily.     metFORMIN (GLUCOPHAGE) 500 MG tablet TAKE 1 TABLET BY MOUTH  TWICE DAILY WITH MEALS 180 tablet 3   nitroGLYCERIN (NITROSTAT) 0.4 MG SL tablet DISSOLVE 1 TABLET UNDER THE TONGUE EVERY 5 MINUTES AS  NEEDED FOR CHEST PAIN FOR  UP TO 3 DOSES. SEEK MEDICAL ATTENTION IF NO RELIEF. 90 tablet 3   Omega-3 Fatty Acids (FISH OIL) 1000 MG CPDR Take by mouth.     sertraline (ZOLOFT) 50 MG tablet TAKE 1 TABLET BY MOUTH  DAILY 90 tablet 3   tadalafil (CIALIS) 20 MG tablet TAKE ONE TABLET BY MOUTH 30 MINUTES PRIOR TO INTERCOURSE. 30 tablet 5   tamsulosin (FLOMAX) 0.4 MG CAPS capsule TAKE 2 CAPSULES BY MOUTH  DAILY 180 capsule 3   No current facility-administered medications on file prior to visit.    No Known Allergies Social History   Occupational History   Not on file  Tobacco Use   Smoking status: Former    Packs/day: 1.00    Years: 50.00    Pack years: 50.00     Types: Cigarettes    Quit date: 04/14/2011    Years since quitting: 10.2   Smokeless tobacco: Never  Substance and Sexual Activity   Alcohol use: No   Drug use: No   Sexual activity: Not on file   Family History  Problem Relation Age of Onset   Deep vein thrombosis Mother        Varicose Veins   Heart disease Mother    Hyperlipidemia Mother    Hypertension Mother    Peripheral vascular disease Mother    Lung cancer Mother    Stroke Father    Deep vein thrombosis Father    Diabetes Father    Heart disease Father        Heart Disease  before age 78   Hyperlipidemia Father  Hypertension Father    Heart attack Father    Peripheral vascular disease Father    Alcohol abuse Unknown    Stroke Unknown    Coronary artery disease Unknown    Hyperlipidemia Unknown    Deep vein thrombosis Brother    Diabetes Brother    Heart disease Brother        Heart Disease before age 32   Hyperlipidemia Brother    Hypertension Brother    Heart attack Brother    Peripheral vascular disease Brother    Lung cancer Sister    Kidney cancer Brother        on hospice    Other Neg Hx        denies family history of blood clots or blood disorder   Immunization History  Administered Date(s) Administered   Influenza Whole 09/13/2008, 09/12/2009, 10/08/2010   Influenza, High Dose Seasonal PF 12/05/2015, 08/07/2016, 08/27/2017, 08/23/2019   Influenza,inj,Quad PF,6+ Mos 09/01/2014   Influenza-Unspecified 09/24/2013, 07/25/2018, 09/24/2020   Pneumococcal Conjugate-13 06/30/2014   Pneumococcal Polysaccharide-23 09/26/2008, 09/29/2009, 12/05/2015   Td 06/30/2014     Review of Systems: Negative except as noted in the HPI.  Objective: There were no vitals filed for this visit.  Fernando Combs is a pleasant 75 y.o. male in NAD. AAO X 3.  Vascular Examination: Capillary refill time to digits immediate b/l. Palpable DP pulse(s) b/l lower extremities Palpable PT pulse(s) b/l lower extremities Pedal  hair absent. Lower extremity skin temperature gradient within normal limits. No pain with calf compression b/l. No edema noted b/l lower extremities.  Dermatological Examination: Pedal skin is thin shiny, atrophic b/l lower extremities. Toenails 1-5 b/l elongated, discolored, dystrophic, thickened, crumbly with subungual debris and tenderness to dorsal palpation. Hyperkeratotic lesion(s) L hallux, R hallux, submet head 5 left foot, and submet head 5 right foot.  No erythema, no edema, no drainage, no fluctuance.  Musculoskeletal Examination: Normal muscle strength 5/5 to all lower extremity muscle groups bilaterally. Hammertoe(s) noted to the 2-5 bilaterally.  Footwear Assessment: Does the patient wear appropriate shoes? Yes. Does the patient need inserts/orthotics? He could benefit from inserts due to plantar calluses.  Neurological Examination: Protective sensation diminished with 10g monofilament b/l. Vibratory sensation diminished b/l.  Hemoglobin A1C Latest Ref Rng & Units 01/01/2021 10/12/2020  HGBA1C 4.6 - 6.5 % 6.7(H) 6.6(H)  Some recent data might be hidden     Assessment: 1. Pain due to onychomycosis of toenails of both feet   2. Callus   3. Acquired hammertoes of both feet   4. Diabetic peripheral neuropathy associated with type 2 diabetes mellitus ([place])   5. Encounter for diabetic foot exam ([place])      ADA Risk Categorization: High Risk  Patient has one or more of the following: Loss of protective sensation Absent pedal pulses Severe Foot deformity History of foot ulcer  Plan: -Examined patient. -Diabetic foot examination performed today. -Patient to continue soft, supportive shoe gear daily. -Toenails 1-5 b/l were debrided in length and girth with sterile nail nippers and dremel without iatrogenic bleeding.  -Callus(es) L hallux, R hallux, submet head 5 left foot, and submet head 5 right foot pared utilizing sterile scalpel blade without complication or incident.  Total number debrided =4. -Patient to report any pedal injuries to medical professional immediately. -[name] to call should there be question/concern in the interim.  Return in about 3 months (around 09/26/2021).  Marzetta Board, DPM

## 2021-07-04 ENCOUNTER — Telehealth: Payer: Self-pay | Admitting: Adult Health

## 2021-07-04 NOTE — Telephone Encounter (Signed)
Left message for patient to call back and schedule Medicare Annual Wellness Visit (AWV) either virtually or in office. Left both  my jabber number [phone] and office number    Last AWV 09/10/18  please schedule at anytime with [facility] Nurse Health Advisor 1 or 2   This should be a 45 minute visit.

## 2021-08-01 ENCOUNTER — Other Ambulatory Visit: Payer: Self-pay | Admitting: Adult Health

## 2021-08-09 ENCOUNTER — Other Ambulatory Visit: Payer: Self-pay | Admitting: Adult Health

## 2021-08-22 ENCOUNTER — Telehealth: Payer: Self-pay | Admitting: Adult Health

## 2021-08-22 NOTE — Telephone Encounter (Signed)
PT called to see if Fernando Combs can call him in anything for his cough, Antibiotics, and a prednisone patch as well to prevent what they have from getting worse. Please advise.

## 2021-08-23 ENCOUNTER — Telehealth (INDEPENDENT_AMBULATORY_CARE_PROVIDER_SITE_OTHER): Payer: Medicare Other | Admitting: Adult Health

## 2021-08-23 ENCOUNTER — Encounter: Payer: Self-pay | Admitting: Adult Health

## 2021-08-23 VITALS — Ht 69.0 in | Wt 225.0 lb

## 2021-08-23 DIAGNOSIS — J988 Other specified respiratory disorders: Secondary | ICD-10-CM | POA: Diagnosis not present

## 2021-08-23 MED ORDER — AZITHROMYCIN 250 MG PO TABS: ORAL_TABLET | ORAL | 0 refills | Status: AC

## 2021-08-23 MED ORDER — PREDNISONE 20 MG PO TABS: 20.0000 mg | ORAL_TABLET | Freq: Every day | ORAL | 0 refills | Status: DC

## 2021-08-23 NOTE — Telephone Encounter (Signed)
Pt needs an ppt. Called p ton number provider to front office staff [phone] but its going to vm.

## 2021-08-23 NOTE — Progress Notes (Signed)
Virtual Visit via Video Note  I connected with Fernando Combs on 08/23/21 at  1:30 PM EDT by a video enabled telemedicine application and verified that I am speaking with the correct person using two identifiers.  Location patient: home Location provider:work or home office Persons participating in the virtual visit: patient, provider  I discussed the limitations of evaluation and management by telemedicine and the availability of in person appointments. The patient expressed understanding and agreed to proceed.   HPI:  75 year old male who is being evaluated today for an acute issue.  His symptoms started roughly 7 days ago and have been getting progressively worse.  Symptoms include dry wheezy cough, fatigue, sinus pain and pressure, rhinorrhea, wheezing, shortness of breath.  He denies fevers or chills.  His wife is sick with the same symptoms  Currently taking over-the-counter cold medication such as Alka-Seltzer cold but this is not helping with his symptoms.  ROS: See pertinent positives and negatives per HPI.  Past Medical History:  Diagnosis Date   Benign prostatic hypertrophy    CAD (coronary artery disease)    cath 05/16/2015 patent LIMA to LAD, SVG to OM, SVG to RPDA, SVG to diagonal was occluded. PTCA 95% prox LAD reduce down to 60%. Medical management   Carotid artery occlusion    Colitis, ischemic ([place]) 06/2005    history of   Diabetes mellitus type II    Diabetes, polyneuropathy ([place])    Diabetic peripheral neuropathy ([place])    DVT (deep venous thrombosis) (HCC)    Gout    History of myocardial infarction    History of syncope    most likely vasovagal   History of tobacco abuse    Hyperlipidemia    Hypertension    Hypothyroidism    Myocardial infarction [facility])    Peripheral vascular disease ([place])    Shingles    Thrombus 04/2008   acute thrombus of his right superficial artery with claudication    Past Surgical History:  Procedure Laterality Date   CARDIAC  CATHETERIZATION N/A 05/15/2015   Procedure: Left Heart Cath and Cors/Grafts Angiography;  Surgeon: Troy Sine, MD;  Location: [place] CV LAB;  Service: Cardiovascular;  Laterality: N/A;   CATARACT EXTRACTION Bilateral    CORONARY ARTERY BYPASS GRAFT  08/04/00   times 4 (left interanl mammary artery to the left anterior descending, saphenous vein,graft to diagonal, saphenous vein graft to obtuse marginal, spahenous vein graft to posterior descending-Surgeon  Tharon Aquas Tright III   LOWER EXTREMITY ANGIOGRAM  March 09, 2012   LOWER EXTREMITY ANGIOGRAM Right 03/09/2012   Procedure: LOWER EXTREMITY ANGIOGRAM;  Surgeon: Serafina Mitchell, MD;  Location: [facility] CATH LAB;  Service: Cardiovascular;  Laterality: Right;   OTHER SURGICAL HISTORY  11/2010   angiogram, right leg stent placement-  -Dr Kellie Simmering   peripheral vascular surgery     PR VEIN BYPASS GRAFT,AORTO-FEM-POP  08/04/00    Family History  Problem Relation Age of Onset   Deep vein thrombosis Mother        Varicose Veins   Heart disease Mother    Hyperlipidemia Mother    Hypertension Mother    Peripheral vascular disease Mother    Lung cancer Mother    Stroke Father    Deep vein thrombosis Father    Diabetes Father    Heart disease Father        Heart Disease  before age 43   Hyperlipidemia Father    Hypertension Father    Heart  attack Father    Peripheral vascular disease Father    Alcohol abuse Unknown    Stroke Unknown    Coronary artery disease Unknown    Hyperlipidemia Unknown    Deep vein thrombosis Brother    Diabetes Brother    Heart disease Brother        Heart Disease before age 58   Hyperlipidemia Brother    Hypertension Brother    Heart attack Brother    Peripheral vascular disease Brother    Lung cancer Sister    Kidney cancer Brother        on hospice    Other Neg Hx        denies family history of blood clots or blood disorder       Current Outpatient Medications:    allopurinol (ZYLOPRIM) 100 MG  tablet, Take 200 mg by mouth daily., Disp: , Rfl:    amLODipine (NORVASC) 5 MG tablet, TAKE 1 TABLET BY MOUTH  DAILY, Disp: 90 tablet, Rfl: 3   aspirin 81 MG chewable tablet, Chew 1 tablet (81 mg total) by mouth daily., Disp: , Rfl:    atorvastatin (LIPITOR) 80 MG tablet, TAKE 1 TABLET BY MOUTH  DAILY AT 6 P.M., Disp: 90 tablet, Rfl: 3   carvedilol (COREG) 25 MG tablet, TAKE 1 TABLET BY MOUTH  TWICE DAILY WITH MEALS, Disp: 180 tablet, Rfl: 3   Cholecalciferol (VITAMIN D-3) 125 MCG (5000 UT) TABS, Take 1 tablet by mouth daily., Disp: 90 tablet, Rfl: 3   FARXIGA 10 MG TABS tablet, Take 10 mg by mouth daily., Disp: , Rfl:    FLUAD 0.5 ML SUSY, ADM 0.5ML IM UTD, Disp: , Rfl: 0   gabapentin (NEURONTIN) 300 MG capsule, TAKE 1 CAPSULE BY MOUTH 3  TIMES DAILY, Disp: 270 capsule, Rfl: 3   isosorbide mononitrate (IMDUR) 60 MG 24 hr tablet, TAKE 1 TABLET BY MOUTH  DAILY, Disp: 90 tablet, Rfl: 3   JARDIANCE 10 MG TABS tablet, Take 10 mg by mouth daily., Disp: , Rfl:    levothyroxine (SYNTHROID) 112 MCG tablet, TAKE 1 TABLET BY MOUTH  DAILY, Disp: 90 tablet, Rfl: 3   losartan (COZAAR) 25 MG tablet, Take 25 mg by mouth daily., Disp: , Rfl:    metFORMIN (GLUCOPHAGE) 500 MG tablet, TAKE 1 TABLET BY MOUTH  TWICE DAILY WITH MEALS, Disp: 180 tablet, Rfl: 3   nitroGLYCERIN (NITROSTAT) 0.4 MG SL tablet, DISSOLVE 1 TABLET UNDER THE TONGUE EVERY 5 MINUTES AS  NEEDED FOR CHEST PAIN FOR  UP TO 3 DOSES. SEEK MEDICAL ATTENTION IF NO RELIEF., Disp: 90 tablet, Rfl: 3   Omega-3 Fatty Acids (FISH OIL) 1000 MG CPDR, Take by mouth., Disp: , Rfl:    sertraline (ZOLOFT) 50 MG tablet, TAKE 1 TABLET BY MOUTH  DAILY, Disp: 90 tablet, Rfl: 3   tadalafil (CIALIS) 20 MG tablet, TAKE ONE TABLET BY MOUTH 30 MINUTES PRIOR TO INTERCOURSE., Disp: 30 tablet, Rfl: 5   tamsulosin (FLOMAX) 0.4 MG CAPS capsule, TAKE 2 CAPSULES BY MOUTH  DAILY, Disp: 180 capsule, Rfl: 3   gabapentin (NEURONTIN) 100 MG capsule, Take 1 capsule (100 mg total) by mouth  3 (three) times daily., Disp: 270 capsule, Rfl: 1  EXAM:  VITALS per patient if applicable:  GENERAL: alert, oriented, appears well and in no acute distress  HEENT: atraumatic, conjunttiva clear, no obvious abnormalities on inspection of external nose and ears  NECK: normal movements of the head and neck  LUNGS: on inspection no signs  of respiratory distress, breathing rate appears normal, no obvious gross SOB or gasping. Dry constant cough + auditory wheezing noted  CV: no obvious cyanosis  MS: moves all visible extremities without noticeable abnormality  PSYCH/NEURO: pleasant and cooperative, no obvious depression or anxiety, speech and thought processing grossly intact  ASSESSMENT AND PLAN:  Discussed the following assessment and plan:  1. Respiratory infection  - predniSONE (DELTASONE) 20 MG tablet; Take 1 tablet (20 mg total) by mouth daily with breakfast.  Dispense: 7 tablet; Refill: 0 - azithromycin (ZITHROMAX) 250 MG tablet; Take 2 tablets on day 1, then 1 tablet daily on days 2 through 5  Dispense: 6 tablet; Refill: 0 - Follow up in 2-3 days if no improvement   I discussed the assessment and treatment plan with the patient. The patient was provided an opportunity to ask questions and all were answered. The patient agreed with the plan and demonstrated an understanding of the instructions.   The patient was advised to call back or seek an in-person evaluation if the symptoms worsen or if the condition fails to improve as anticipated.   Dorothyann Peng, NP

## 2021-08-23 NOTE — Telephone Encounter (Signed)
Pt has been scheduled.  °

## 2021-09-04 ENCOUNTER — Other Ambulatory Visit: Payer: Self-pay | Admitting: Adult Health

## 2021-09-04 DIAGNOSIS — I10 Essential (primary) hypertension: Secondary | ICD-10-CM

## 2021-09-04 DIAGNOSIS — Z760 Encounter for issue of repeat prescription: Secondary | ICD-10-CM

## 2021-09-05 NOTE — Telephone Encounter (Signed)
Pt is almost due for CPE. Pt Rx will be filled for 30 days.

## 2021-10-02 ENCOUNTER — Other Ambulatory Visit: Payer: Self-pay | Admitting: *Deleted

## 2021-10-02 ENCOUNTER — Encounter: Payer: Self-pay | Admitting: Podiatry

## 2021-10-02 ENCOUNTER — Ambulatory Visit: Payer: Medicare Other | Admitting: Podiatry

## 2021-10-02 ENCOUNTER — Other Ambulatory Visit: Payer: Self-pay

## 2021-10-02 DIAGNOSIS — E1142 Type 2 diabetes mellitus with diabetic polyneuropathy: Secondary | ICD-10-CM

## 2021-10-02 DIAGNOSIS — L84 Corns and callosities: Secondary | ICD-10-CM | POA: Diagnosis not present

## 2021-10-02 DIAGNOSIS — B351 Tinea unguium: Secondary | ICD-10-CM

## 2021-10-02 DIAGNOSIS — I70213 Atherosclerosis of native arteries of extremities with intermittent claudication, bilateral legs: Secondary | ICD-10-CM | POA: Diagnosis not present

## 2021-10-02 DIAGNOSIS — M79674 Pain in right toe(s): Secondary | ICD-10-CM

## 2021-10-02 DIAGNOSIS — M79675 Pain in left toe(s): Secondary | ICD-10-CM

## 2021-10-02 DIAGNOSIS — N1832 Chronic kidney disease, stage 3b: Secondary | ICD-10-CM | POA: Diagnosis not present

## 2021-10-03 ENCOUNTER — Other Ambulatory Visit: Payer: Self-pay | Admitting: Adult Health

## 2021-10-04 NOTE — Telephone Encounter (Signed)
Pt is due for DM visit. Called pt to schedule a visit no answer.

## 2021-10-06 NOTE — Progress Notes (Signed)
  Subjective:  Patient ID: Fernando Combs, male    DOB: Apr 07, 1946,  MRN: [id number]  Fernando Combs presents to clinic today for at risk foot care with history of diabetic neuropathy and callus(es) of both feet and painful thick toenails that are difficult to trim. Painful toenails interfere with ambulation. Aggravating factors include wearing enclosed shoe gear. Pain is relieved with periodic professional debridement. Painful calluses are aggravated when weightbearing with and without shoegear. Pain is relieved with periodic professional debridement.  Patient states blood glucose was 110 mg/dl this morning.    He states he is starting to experience cramping in his feet and he will speak with PCP about it. He is also followed by Vascular Surgery.   PCP is Dorothyann Peng, NP , and last visit was 08/23/2021.  No Known Allergies  Review of Systems: Negative except as noted in the HPI. Objective:   Constitutional [name] is a pleasant 75 y.o. Caucasian male, WD, WN in NAD. AAO x 3.   Vascular CFT immediate b/l LE. Palpable DP/PT pulses b/l LE. Digital hair absent b/l. Skin temperature gradient WNL b/l. No pain with calf compression b/l. No edema noted b/l. Lower extremity skin temperature gradient within normal limits. No cyanosis or clubbing noted.  Neurologic Normal speech. Oriented to person, place, and time. Protective sensation diminished with 10g monofilament b/l. Vibratory sensation diminished b/l.  Dermatologic Pedal skin thin, shiny and atrophic b/l LE. Toenails 1-5 b/l elongated, discolored, dystrophic, thickened, crumbly with subungual debris and tenderness to dorsal palpation. Hyperkeratotic lesion(s) bilateral great toes and submet head 5 b/l.  No erythema, no edema, no drainage, no fluctuance.  Orthopedic: Normal muscle strength 5/5 to all lower extremity muscle groups bilaterally. Hammertoe deformity noted 2-5 b/l.   Radiographs: None Assessment:   1. Pain due to  onychomycosis of toenails of both feet   2. Callus   3. Atherosclerosis of native artery of both lower extremities with intermittent claudication ([place])   4. Diabetic peripheral neuropathy associated with type 2 diabetes mellitus ([place])    Plan:  Patient was evaluated and treated and all questions answered. Consent given for treatment as described below: -He has no wounds, signs of ischemia or gangrene. He will speak with PCP and Vascular Surgery about cramping in his feet. We discussed this could be related to electrolyte abnormalities as well as be indicative of blockage in his legs. -Continue diabetic foot care principles: inspect feet daily, monitor glucose as recommended by PCP and/or Endocrinologist, and follow prescribed diet per PCP, Endocrinologist and/or dietician. -Mycotic toenails 1-5 bilaterally were debrided in length and girth with sterile nail nippers and dremel without incident. -Callus(es) bilateral great toes and submet head 5 b/l pared utilizing sterile scalpel blade without complication or incident. Total number debrided =4. -[name] to call should there be question/concern in the interim.  Return in about 3 months (around 01/02/2022).  Marzetta Board, DPM

## 2021-10-18 ENCOUNTER — Other Ambulatory Visit: Payer: Self-pay | Admitting: Adult Health

## 2021-10-18 DIAGNOSIS — I10 Essential (primary) hypertension: Secondary | ICD-10-CM

## 2021-10-18 DIAGNOSIS — Z760 Encounter for issue of repeat prescription: Secondary | ICD-10-CM

## 2021-10-22 ENCOUNTER — Ambulatory Visit: Payer: Medicare Other

## 2021-10-22 NOTE — Telephone Encounter (Signed)
Unsuccessful attempt to reach patient for scheduled AWV on preferred number listed in notes. Unable to leave a voice message. Ok to reschedule.

## 2021-10-22 NOTE — Telephone Encounter (Signed)
Patient need to schedule an ov for more refills. 

## 2022-01-07 ENCOUNTER — Ambulatory Visit (INDEPENDENT_AMBULATORY_CARE_PROVIDER_SITE_OTHER): Payer: Medicare Other | Admitting: Adult Health

## 2022-01-07 ENCOUNTER — Encounter: Payer: Self-pay | Admitting: Adult Health

## 2022-01-07 VITALS — BP 130/70 | HR 72 | Temp 98.6°F | Ht 68.5 in | Wt 216.0 lb

## 2022-01-07 DIAGNOSIS — N1832 Chronic kidney disease, stage 3b: Secondary | ICD-10-CM | POA: Diagnosis not present

## 2022-01-07 DIAGNOSIS — I10 Essential (primary) hypertension: Secondary | ICD-10-CM | POA: Diagnosis not present

## 2022-01-07 DIAGNOSIS — N400 Enlarged prostate without lower urinary tract symptoms: Secondary | ICD-10-CM

## 2022-01-07 DIAGNOSIS — F339 Major depressive disorder, recurrent, unspecified: Secondary | ICD-10-CM

## 2022-01-07 DIAGNOSIS — H6123 Impacted cerumen, bilateral: Secondary | ICD-10-CM

## 2022-01-07 DIAGNOSIS — E038 Other specified hypothyroidism: Secondary | ICD-10-CM | POA: Diagnosis not present

## 2022-01-07 DIAGNOSIS — I2510 Atherosclerotic heart disease of native coronary artery without angina pectoris: Secondary | ICD-10-CM

## 2022-01-07 DIAGNOSIS — E118 Type 2 diabetes mellitus with unspecified complications: Secondary | ICD-10-CM

## 2022-01-07 DIAGNOSIS — F4323 Adjustment disorder with mixed anxiety and depressed mood: Secondary | ICD-10-CM

## 2022-01-07 DIAGNOSIS — E1142 Type 2 diabetes mellitus with diabetic polyneuropathy: Secondary | ICD-10-CM | POA: Diagnosis not present

## 2022-01-07 DIAGNOSIS — F5101 Primary insomnia: Secondary | ICD-10-CM

## 2022-01-07 DIAGNOSIS — Z0000 Encounter for general adult medical examination without abnormal findings: Secondary | ICD-10-CM

## 2022-01-07 LAB — CBC WITH DIFFERENTIAL/PLATELET
Basophils Absolute: 0.1 10*3/uL (ref 0.0–0.1)
Basophils Relative: 0.9 % (ref 0.0–3.0)
Eosinophils Absolute: 0.4 10*3/uL (ref 0.0–0.7)
Eosinophils Relative: 3.9 % (ref 0.0–5.0)
HCT: 39.6 % (ref 39.0–52.0)
Hemoglobin: 13 g/dL (ref 13.0–17.0)
Lymphocytes Relative: 17.3 % (ref 12.0–46.0)
Lymphs Abs: 1.6 10*3/uL (ref 0.7–4.0)
MCHC: 32.9 g/dL (ref 30.0–36.0)
MCV: 88.8 fl (ref 78.0–100.0)
Monocytes Absolute: 0.6 10*3/uL (ref 0.1–1.0)
Monocytes Relative: 6.4 % (ref 3.0–12.0)
Neutro Abs: 6.6 10*3/uL (ref 1.4–7.7)
Neutrophils Relative %: 71.5 % (ref 43.0–77.0)
Platelets: 239 10*3/uL (ref 150.0–400.0)
RBC: 4.46 Mil/uL (ref 4.22–5.81)
RDW: 16.6 % — ABNORMAL HIGH (ref 11.5–15.5)
WBC: 9.2 10*3/uL (ref 4.0–10.5)

## 2022-01-07 LAB — HEMOGLOBIN A1C: Hgb A1c MFr Bld: 6.6 % — ABNORMAL HIGH (ref 4.6–6.5)

## 2022-01-07 LAB — TSH: TSH: 6.97 u[IU]/mL — ABNORMAL HIGH (ref 0.35–5.50)

## 2022-01-07 LAB — PSA: PSA: 0.41 ng/mL (ref 0.10–4.00)

## 2022-01-07 MED ORDER — TRAZODONE HCL 50 MG PO TABS: 25.0000 mg | ORAL_TABLET | Freq: Every evening | ORAL | 1 refills | Status: DC | PRN

## 2022-01-07 NOTE — Patient Instructions (Addendum)
It was great seeing you today   We will follow up with you regarding your lab work   Please let me know if you need anything    I am going to prescribe you Trazodone to help you sleep   Go ahead and move the norvasc and losartan to evening time   Lets follow up in 1 month to see how you are doing

## 2022-01-07 NOTE — Progress Notes (Signed)
Subjective:    Patient ID: Fernando Combs, male    DOB: 1946-08-08, 76 y.o.   MRN: [id number]  HPI Patient presents for yearly preventative medicine examination. He is a pleasant 76 year old male who  has a past medical history of Benign prostatic hypertrophy, CAD (coronary artery disease), Carotid artery occlusion, Colitis, ischemic ([place]) (06/2005), Diabetes mellitus type II, Diabetes, polyneuropathy ([place]), Diabetic peripheral neuropathy ([place]), DVT (deep venous thrombosis) ([place]), Gout, History of myocardial infarction, History of syncope, History of tobacco abuse, Hyperlipidemia, Hypertension, Hypothyroidism, Myocardial infarction [facility]), Peripheral vascular disease ([place]), Shingles, and Thrombus (04/2008).  DM Type 2 -currently maintained on metformin 500 mg twice daily.  He does not monitor  his blood sugars. Denies episodes of hypoglycemia.  He does report that his nephrologist tried placing him on Jardiance and Farxiga but both were too expensive for him. Lab Results  Component Value Date   HGBA1C 6.7 (H) 01/01/2021   Diabetic neuropathy-takes gabapentin 100 mg 3 times daily.  Feels as though this helps with the discomfort in his lower extremities but does not completely resolve it.  BPH-currently prescribed Flomax 0.8 mg daily  Hypertension-prescribed Norvasc 5 mg daily and Coreg 25 mg BID, and losartan 25 mg daily.  He does report episodes of lightheadedness in the late morning after taking his blood pressure medications.  He has had falls over the year and the lightheadedness.  Denies loss of consciousness or injury during these falls. BP Readings from Last 3 Encounters:  01/07/22 130/70  01/01/21 (!) 150/86  10/12/20 124/72   CAD -managed by cardiology and vascular surgery.  Has a history of multiple stents.  Last cardiac cath was in 2016.  Currently prescribed aspirin 81 mg, Lipitor 80 mg carvedilol 25 mg and Imdur 60 mg.  He denies chest pain, significant shortness of breath, fatigue,  or lower extremity claudication symptoms Lab Results  Component Value Date   CHOL 98 10/12/2020   HDL 40 10/12/2020   LDLCALC 37 10/12/2020   LDLDIRECT 37.0 10/13/2019   TRIG 127 10/12/2020   CHOLHDL 2.5 10/12/2020   Hypothyroidism-well controlled with Synthroid 160mcg daily  Anxiety/ Depression -takes Zoloft 50 mg daily.  As of lately his depression has been worse.  Does not enjoy things that he once did and is having trouble sleeping.  He often wakes up in the middle the night unable to go back to sleep.  He has tried over-the-counter melatonin without improvement in his symptoms.  CKD Stage 3- is being seen by Nephrology on a routine basis   All immunizations and health maintenance protocols were reviewed with the patient and needed orders were placed.  Appropriate screening laboratory values were ordered for the patient including screening of hyperlipidemia, renal function and hepatic function. If indicated by BPH, a PSA was ordered.  Medication reconciliation,  past medical history, social history, problem list and allergies were reviewed in detail with the patient  Goals were established with regard to weight loss, exercise, and  diet in compliance with medications  Wt Readings from Last 3 Encounters:  01/07/22 216 lb (98 kg)  08/23/21 225 lb (102.1 kg)  01/01/21 225 lb (102.1 kg)      Review of Systems  Constitutional: Negative.   HENT: Negative.    Eyes: Negative.   Respiratory: Negative.    Cardiovascular: Negative.   Gastrointestinal: Negative.   Endocrine: Negative.   Genitourinary: Negative.   Musculoskeletal: Negative.   Skin: Negative.   Allergic/Immunologic: Negative.   Neurological:  Negative.   Hematological: Negative.   Psychiatric/Behavioral:  Positive for dysphoric mood and sleep disturbance. Negative for suicidal ideas.   All other systems reviewed and are negative.  Past Medical History:  Diagnosis Date   Benign prostatic hypertrophy    CAD  (coronary artery disease)    cath 05/16/2015 patent LIMA to LAD, SVG to OM, SVG to RPDA, SVG to diagonal was occluded. PTCA 95% prox LAD reduce down to 60%. Medical management   Carotid artery occlusion    Colitis, ischemic ([place]) 06/2005    history of   Diabetes mellitus type II    Diabetes, polyneuropathy (HCC)    Diabetic peripheral neuropathy (HCC)    DVT (deep venous thrombosis) (HCC)    Gout    History of myocardial infarction    History of syncope    most likely vasovagal   History of tobacco abuse    Hyperlipidemia    Hypertension    Hypothyroidism    Myocardial infarction [facility])    Peripheral vascular disease ([place])    Shingles    Thrombus 04/2008   acute thrombus of his right superficial artery with claudication    Social History   Socioeconomic History   Marital status: Married    Spouse name: Not on file   Number of children: Not on file   Years of education: Not on file   Highest education level: Not on file  Occupational History   Not on file  Tobacco Use   Smoking status: Former    Packs/day: 1.00    Years: 50.00    Pack years: 50.00    Types: Cigarettes    Quit date: 04/14/2011    Years since quitting: 10.7   Smokeless tobacco: Never  Substance and Sexual Activity   Alcohol use: No   Drug use: No   Sexual activity: Not on file  Other Topics Concern   Not on file  Social History Narrative   Occupation:  Retired from [employer]    Married - lives with wife (2nd marriage)   3 children     Alcohol use-no   Tobacco Use - Quit smoking 3 years ago          Social Determinants of Health   Financial Resource Strain: Not on file  Food Insecurity: Not on file  Transportation Needs: Not on file  Physical Activity: Not on file  Stress: Not on file  Social Connections: Not on file  Intimate Partner Violence: Not on file    Past Surgical History:  Procedure Laterality Date   CARDIAC CATHETERIZATION N/A 05/15/2015   Procedure: Left Heart Cath  and Cors/Grafts Angiography;  Surgeon: Troy Sine, MD;  Location: [facility];  Service: Cardiovascular;  Laterality: N/A;   CATARACT EXTRACTION Bilateral    CORONARY ARTERY BYPASS GRAFT  08/04/00   times 4 (left interanl mammary artery to the left anterior descending, saphenous vein,graft to diagonal, saphenous vein graft to obtuse marginal, spahenous vein graft to posterior descending-Surgeon  Tharon Aquas Tright III   LOWER EXTREMITY ANGIOGRAM  March 09, 2012   LOWER EXTREMITY ANGIOGRAM Right 03/09/2012   Procedure: LOWER EXTREMITY ANGIOGRAM;  Surgeon: Serafina Mitchell, MD;  Location: [facility] CATH LAB;  Service: Cardiovascular;  Laterality: Right;   OTHER SURGICAL HISTORY  11/2010   angiogram, right leg stent placement-  -Dr Kellie Simmering   peripheral vascular surgery     PR VEIN BYPASS GRAFT,AORTO-FEM-POP  08/04/00    Family History  Problem Relation  Age of Onset   Deep vein thrombosis Mother        Varicose Veins   Heart disease Mother    Hyperlipidemia Mother    Hypertension Mother    Peripheral vascular disease Mother    Lung cancer Mother    Stroke Father    Deep vein thrombosis Father    Diabetes Father    Heart disease Father        Heart Disease  before age 35   Hyperlipidemia Father    Hypertension Father    Heart attack Father    Peripheral vascular disease Father    Alcohol abuse Unknown    Stroke Unknown    Coronary artery disease Unknown    Hyperlipidemia Unknown    Deep vein thrombosis Brother    Diabetes Brother    Heart disease Brother        Heart Disease before age 66   Hyperlipidemia Brother    Hypertension Brother    Heart attack Brother    Peripheral vascular disease Brother    Lung cancer Sister    Kidney cancer Brother        on hospice    Other Neg Hx        denies family history of blood clots or blood disorder    No Known Allergies  Current Outpatient Medications on File Prior to Visit  Medication Sig Dispense Refill   allopurinol (ZYLOPRIM)  100 MG tablet TAKE 2 TABLETS BY MOUTH  DAILY 60 tablet 0   amLODipine (NORVASC) 5 MG tablet TAKE 1 TABLET BY MOUTH  DAILY 30 tablet 0   aspirin 81 MG chewable tablet Chew 1 tablet (81 mg total) by mouth daily.     atorvastatin (LIPITOR) 80 MG tablet TAKE 1 TABLET BY MOUTH  DAILY AT 6 P.M. 30 tablet 0   carvedilol (COREG) 25 MG tablet TAKE 1 TABLET BY MOUTH  TWICE DAILY WITH MEALS 60 tablet 0   Cholecalciferol (VITAMIN D-3) 125 MCG (5000 UT) TABS Take 1 tablet by mouth daily. 90 tablet 3   gabapentin (NEURONTIN) 100 MG capsule Take 1 capsule (100 mg total) by mouth 3 (three) times daily. 270 capsule 1   isosorbide mononitrate (IMDUR) 60 MG 24 hr tablet TAKE 1 TABLET BY MOUTH  DAILY 30 tablet 0   levothyroxine (SYNTHROID) 112 MCG tablet TAKE 1 TABLET BY MOUTH  DAILY 90 tablet 3   losartan (COZAAR) 25 MG tablet Take 25 mg by mouth daily.     metFORMIN (GLUCOPHAGE) 500 MG tablet TAKE 1 TABLET BY MOUTH  TWICE DAILY WITH MEALS 180 tablet 3   nitroGLYCERIN (NITROSTAT) 0.4 MG SL tablet DISSOLVE 1 TABLET UNDER THE TONGUE EVERY 5 MINUTES AS  NEEDED FOR CHEST PAIN FOR  UP TO 3 DOSES. SEEK MEDICAL ATTENTION IF NO RELIEF. 90 tablet 3   Omega-3 Fatty Acids (FISH OIL) 1000 MG CPDR Take by mouth.     sertraline (ZOLOFT) 50 MG tablet TAKE 1 TABLET BY MOUTH  DAILY 90 tablet 3   tadalafil (CIALIS) 20 MG tablet TAKE ONE TABLET BY MOUTH 30 MINUTES PRIOR TO INTERCOURSE. 30 tablet 5   tamsulosin (FLOMAX) 0.4 MG CAPS capsule TAKE 2 CAPSULES BY MOUTH  DAILY 180 capsule 3   No current facility-administered medications on file prior to visit.    BP 130/70    Pulse 72    Temp 98.6 F (37 C) (Oral)    Ht 5' 8.5" (1.74 m)    Wt 216 lb (  98 kg)    SpO2 96%    BMI 32.36 kg/m       Objective:   Physical Exam Vitals and nursing note reviewed.  Constitutional:      General: He is not in acute distress.    Appearance: Normal appearance. He is well-developed and normal weight.  HENT:     Head: Normocephalic and  atraumatic.     Right Ear: Tympanic membrane, ear canal and external ear normal. There is impacted cerumen.     Left Ear: Tympanic membrane, ear canal and external ear normal. There is impacted cerumen.     Nose: Nose normal. No congestion or rhinorrhea.     Mouth/Throat:     Mouth: Mucous membranes are moist.     Pharynx: Oropharynx is clear. No oropharyngeal exudate or posterior oropharyngeal erythema.  Eyes:     General:        Right eye: No discharge.        Left eye: No discharge.     Extraocular Movements: Extraocular movements intact.     Conjunctiva/sclera: Conjunctivae normal.     Pupils: Pupils are equal, round, and reactive to light.  Neck:     Vascular: No carotid bruit.     Trachea: No tracheal deviation.  Cardiovascular:     Rate and Rhythm: Normal rate and regular rhythm.     Pulses: Normal pulses.     Heart sounds: Normal heart sounds. No murmur heard.   No friction rub. No gallop.  Pulmonary:     Effort: Pulmonary effort is normal. No respiratory distress.     Breath sounds: Normal breath sounds. No stridor. No wheezing, rhonchi or rales.  Chest:     Chest wall: No tenderness.  Abdominal:     General: Bowel sounds are normal. There is no distension.     Palpations: Abdomen is soft. There is no mass.     Tenderness: There is no abdominal tenderness. There is no right CVA tenderness, left CVA tenderness, guarding or rebound.     Hernia: No hernia is present.  Musculoskeletal:        General: No swelling, tenderness, deformity or signs of injury. Normal range of motion.     Right lower leg: No edema.     Left lower leg: No edema.  Lymphadenopathy:     Cervical: No cervical adenopathy.  Skin:    General: Skin is warm and dry.     Capillary Refill: Capillary refill takes less than 2 seconds.     Coloration: Skin is not jaundiced or pale.     Findings: No bruising, erythema, lesion or rash.  Neurological:     General: No focal deficit present.     Mental  Status: He is alert and oriented to person, place, and time.     Cranial Nerves: No cranial nerve deficit.     Sensory: No sensory deficit.     Motor: No weakness.     Coordination: Coordination normal.     Gait: Gait normal.     Deep Tendon Reflexes: Reflexes normal.  Psychiatric:        Mood and Affect: Mood normal.        Behavior: Behavior normal.        Thought Content: Thought content normal.        Judgment: Judgment normal.       Assessment & Plan:   1. Routine general medical examination at a health care facility  - CBC with Differential/Platelet;  Future - Comprehensive metabolic panel; Future - Hemoglobin A1c; Future - Lipid panel; Future - TSH; Future - TSH - Lipid panel - Hemoglobin A1c - Comprehensive metabolic panel - CBC with Differential/Platelet  2. DM (diabetes mellitus), type 2 with complications ([place]) - Continue with Metformin. Consider increase in medication dose - Follow up in 3 or 6 months  - CBC with Differential/Platelet; Future - Comprehensive metabolic panel; Future - Hemoglobin A1c; Future - Lipid panel; Future - TSH; Future - TSH - Lipid panel - Hemoglobin A1c - Comprehensive metabolic panel - CBC with Differential/Platelet  3. Depression, recurrent ([place]) - Will add Trazodone to help him sleep and hopefully help with his depression  - Follow up in one month  - traZODone (DESYREL) 50 MG tablet; Take 0.5-1 tablets (25-50 mg total) by mouth at bedtime as needed for sleep.  Dispense: 90 tablet; Refill: 1  4. Diabetic polyneuropathy associated with type 2 diabetes mellitus (HCC) - Continue Gabapentin 100 mg TID - CBC with Differential/Platelet; Future - Comprehensive metabolic panel; Future - Hemoglobin A1c; Future - Lipid panel; Future - TSH; Future - TSH - Lipid panel - Hemoglobin A1c - Comprehensive metabolic panel - CBC with Differential/Platelet  5. Atherosclerosis of native coronary artery of native heart without angina  pectoris - Continue with statin and ASA - CBC with Differential/Platelet; Future - Comprehensive metabolic panel; Future - Hemoglobin A1c; Future - Lipid panel; Future - TSH; Future - TSH - Lipid panel - Hemoglobin A1c - Comprehensive metabolic panel - CBC with Differential/Platelet  6. Benign prostatic hyperplasia without lower urinary tract symptoms  - PSA; Future - PSA  7. Essential hypertension - Will have him switch to taking his Norvasc and Losartan at night  - Will follow up in one month  - CBC with Differential/Platelet; Future - Comprehensive metabolic panel; Future - Hemoglobin A1c; Future - Lipid panel; Future - TSH; Future - TSH - Lipid panel - Hemoglobin A1c - Comprehensive metabolic panel - CBC with Differential/Platelet  8. Other specified hypothyroidism - Consider increase in synthroid  - CBC with Differential/Platelet; Future - Comprehensive metabolic panel; Future - Hemoglobin A1c; Future - Lipid panel; Future - TSH; Future - TSH - Lipid panel - Hemoglobin A1c - Comprehensive metabolic panel - CBC with Differential/Platelet  9. Stage 3b chronic kidney disease ([place]) - Follow up with nephrology as directed - CBC with Differential/Platelet; Future - Comprehensive metabolic panel; Future - Hemoglobin A1c; Future - Lipid panel; Future - TSH; Future - TSH - Lipid panel - Hemoglobin A1c - Comprehensive metabolic panel - CBC with Differential/Platelet  10. Adjustment disorder with mixed anxiety and depressed mood  - CBC with Differential/Platelet; Future - Comprehensive metabolic panel; Future - Hemoglobin A1c; Future - Lipid panel; Future - TSH; Future - traZODone (DESYREL) 50 MG tablet; Take 0.5-1 tablets (25-50 mg total) by mouth at bedtime as needed for sleep.  Dispense: 90 tablet; Refill: 1 - TSH - Lipid panel - Hemoglobin A1c - Comprehensive metabolic panel - CBC with Differential/Platelet  11. Bilateral impacted cerumen Verbal  consent obtained. Using curette cerumen impaction was removed bilaterally.There were no complications and following the disimpaction the tympanic membrane were visible bilaterally. Tympanic membranes are intact following the procedure.  Auditory canals are normal.  The patient reported relief of symptoms after removal of cerumen. Patient tolerated procedure well.   12. Primary insomnia  - traZODone (DESYREL) 50 MG tablet; Take 0.5-1 tablets (25-50 mg total) by mouth at bedtime as needed for sleep.  Dispense: 90  tablet; Refill: 1  Dorothyann Peng, NP

## 2022-01-08 LAB — COMPREHENSIVE METABOLIC PANEL
ALT: 19 U/L (ref 0–53)
AST: 21 U/L (ref 0–37)
Albumin: 4.6 g/dL (ref 3.5–5.2)
Alkaline Phosphatase: 80 U/L (ref 39–117)
BUN: 35 mg/dL — ABNORMAL HIGH (ref 6–23)
CO2: 26 mEq/L (ref 19–32)
Calcium: 9.8 mg/dL (ref 8.4–10.5)
Chloride: 101 mEq/L (ref 96–112)
Creatinine, Ser: 2.28 mg/dL — ABNORMAL HIGH (ref 0.40–1.50)
GFR: 27.2 mL/min — ABNORMAL LOW (ref >60.00)
Glucose, Bld: 136 mg/dL — ABNORMAL HIGH (ref 70–99)
Potassium: 4.6 mEq/L (ref 3.5–5.1)
Sodium: 139 mEq/L (ref 135–145)
Total Bilirubin: 0.4 mg/dL (ref 0.2–1.2)
Total Protein: 7.4 g/dL (ref 6.0–8.3)

## 2022-01-08 LAB — LIPID PANEL
Cholesterol: 103 mg/dL (ref 0–200)
HDL: 47.3 mg/dL (ref >39.00)
LDL Cholesterol: 31 mg/dL (ref 0–99)
NonHDL: 56.15
Total CHOL/HDL Ratio: 2
Triglycerides: 128 mg/dL (ref 0.0–149.0)
VLDL: 25.6 mg/dL (ref 0.0–40.0)

## 2022-01-10 ENCOUNTER — Other Ambulatory Visit: Payer: Self-pay | Admitting: Adult Health

## 2022-01-10 DIAGNOSIS — E039 Hypothyroidism, unspecified: Secondary | ICD-10-CM

## 2022-01-10 MED ORDER — LEVOTHYROXINE SODIUM 125 MCG PO TABS: 125.0000 ug | ORAL_TABLET | Freq: Every day | ORAL | 0 refills | Status: DC

## 2022-01-14 ENCOUNTER — Ambulatory Visit (INDEPENDENT_AMBULATORY_CARE_PROVIDER_SITE_OTHER): Payer: Medicare Other | Admitting: Podiatry

## 2022-01-14 ENCOUNTER — Encounter: Payer: Self-pay | Admitting: Podiatry

## 2022-01-14 ENCOUNTER — Other Ambulatory Visit: Payer: Self-pay

## 2022-01-14 DIAGNOSIS — L84 Corns and callosities: Secondary | ICD-10-CM

## 2022-01-14 DIAGNOSIS — M79675 Pain in left toe(s): Secondary | ICD-10-CM

## 2022-01-14 DIAGNOSIS — M79674 Pain in right toe(s): Secondary | ICD-10-CM

## 2022-01-14 DIAGNOSIS — B351 Tinea unguium: Secondary | ICD-10-CM

## 2022-01-14 DIAGNOSIS — E1142 Type 2 diabetes mellitus with diabetic polyneuropathy: Secondary | ICD-10-CM | POA: Diagnosis not present

## 2022-01-15 ENCOUNTER — Telehealth: Payer: Self-pay | Admitting: Adult Health

## 2022-01-15 NOTE — Telephone Encounter (Signed)
Patient called in to return Kendra's call.  Please advise.

## 2022-01-17 NOTE — Telephone Encounter (Signed)
Pt notified of update.  

## 2022-01-17 NOTE — Telephone Encounter (Signed)
Tried to call pt no answer on numbers provided.

## 2022-01-18 ENCOUNTER — Other Ambulatory Visit: Payer: Self-pay | Admitting: Adult Health

## 2022-01-18 DIAGNOSIS — F339 Major depressive disorder, recurrent, unspecified: Secondary | ICD-10-CM

## 2022-01-20 NOTE — Progress Notes (Signed)
Subjective: Fernando Combs is a 76 y.o. male patient seen today for follow up of  at risk foot care with history of diabetic neuropathy and callus(es) bilaterally and painful thick toenails that are difficult to trim. Painful toenails interfere with ambulation. Aggravating factors include wearing enclosed shoe gear. Pain is relieved with periodic professional debridement. Painful calluses are aggravated when weightbearing with and without shoegear. Pain is relieved with periodic professional debridement..   Patient states blood glucose was 110 mg/dl today. He states A1c is in "normal range".  New problem(s)/concern(s) today: None    PCP is Dorothyann Peng, NP. Last visit was: 01/07/2022.  No Known Allergies  Objective: Physical Exam  General: Patient is a pleasant 76 y.o. Caucasian male WD, WN in NAD. AAO x 3.   Neurovascular Examination: Capillary refill time to digits immediate b/l. Palpable pedal pulses b/l LE. No pain with calf compression b/l. Lower extremity skin temperature gradient within normal limits. No edema noted b/l LE. No cyanosis or clubbing noted b/l LE.  Protective sensation diminished with 10g monofilament b/l.  Dermatological:  Pedal skin thin, shiny and atrophic b/l LE. No open wounds b/l LE. No interdigital macerations noted b/l LE. Toenails 1-5 bilaterally elongated, discolored, dystrophic, thickened, and crumbly with subungual debris and tenderness to dorsal palpation. Hyperkeratotic lesion(s) submet head 5 b/l.  No erythema, no edema, no drainage, no fluctuance.  Musculoskeletal:  Muscle strength 5/5 to all lower extremity muscle groups bilaterally. Hammertoe deformity noted 2-5 b/l.  Assessment: 1. Pain due to onychomycosis of toenails of both feet   2. Callus   3. Diabetic peripheral neuropathy associated with type 2 diabetes mellitus ([place])    Plan: Patient was evaluated and treated and all questions answered. Consent given for treatment as described  below: -Toenails 1-5 b/l were debrided in length and girth with sterile nail nippers and dremel without iatrogenic bleeding.  -Callus(es) submet head 5 b/l pared utilizing sterile scalpel blade without complication or incident. Total number debrided =2. -[name] to call should there be question/concern in the interim.  Return in about 10 weeks (around 03/25/2022).  Marzetta Board, DPM

## 2022-01-30 ENCOUNTER — Other Ambulatory Visit: Payer: Self-pay | Admitting: Adult Health

## 2022-01-30 DIAGNOSIS — Z760 Encounter for issue of repeat prescription: Secondary | ICD-10-CM

## 2022-02-04 ENCOUNTER — Encounter: Payer: Self-pay | Admitting: Adult Health

## 2022-02-04 ENCOUNTER — Telehealth (INDEPENDENT_AMBULATORY_CARE_PROVIDER_SITE_OTHER): Payer: Medicare Other | Admitting: Adult Health

## 2022-02-04 VITALS — Ht 68.5 in | Wt 216.0 lb

## 2022-02-04 DIAGNOSIS — F339 Major depressive disorder, recurrent, unspecified: Secondary | ICD-10-CM

## 2022-02-04 DIAGNOSIS — G4700 Insomnia, unspecified: Secondary | ICD-10-CM

## 2022-02-04 NOTE — Progress Notes (Signed)
Virtual Visit via Telephone Note ? ?I connected with Fernando Combs on 02/04/22 at 11:00 AM EDT by telephone and verified that I am speaking with the correct person using two identifiers. ?  ?I discussed the limitations, risks, security and privacy concerns of performing an evaluation and management service by telephone and the availability of in person appointments. I also discussed with the patient that there may be a patient responsible charge related to this service. The patient expressed understanding and agreed to proceed. ? ?Location patient: home ?Location provider: work or home office ?Participants present for the call: patient, provider ?Patient did not have a visit in the prior 7 days to address this/these issue(s). ? ? ?History of Present Illness: ?76 year old male who is being evaluated today for 1 month follow-up regarding anxiety/depression/insomnia.  When he was seen 1 month ago for his CPE he felt as though his depression was getting worse despite being on Zoloft 50 mg daily.  He did not enjoy things that he once did and was having trouble sleeping.  He would often wake up in the middle the night unable to go back to sleep.  He had tried over-the-counter melatonin without improvement in his symptoms.  He was started on trazodone 50 mg nightly.  ? ?He reports that he has had significant improvement in his sleep, he is getting a full night sleep and is not waking up throughout the night.  Feels as though his depression has improved as well.  Has no complaints or experiencing any side effects from the trazodone.  He would like to continue with his current dose. ?  ?Observations/Objective: ?Patient sounds cheerful and well on the phone. ?I do not appreciate any SOB. ?Speech and thought processing are grossly intact. ?Patient reported vitals: ? ?Assessment and Plan: ?1. Depression, recurrent ([place]) ?- Continue with Zoloft 50 mg daily and Trazodone 25-50 50 mg QHS  ? ?2. Insomnia, unspecified type ?-  Continue with Trazodone 25-50 mg QHS  ? ? ?Follow Up Instructions: ? ? ? ? ?58099 5-10 ?99442 11-20 ?[id number] 21-30 ?I did not refer this patient for an OV in the next 24 hours for this/these issue(s). ? ?I discussed the assessment and treatment plan with the patient. The patient was provided an opportunity to ask questions and all were answered. The patient agreed with the plan and demonstrated an understanding of the instructions. ?  ?The patient was advised to call back or seek an in-person evaluation if the symptoms worsen or if the condition fails to improve as anticipated. ? ?I provided 12 minutes of non-face-to-face time during this encounter. ? ? ?Dorothyann Peng, NP  ? ?

## 2022-02-10 ENCOUNTER — Other Ambulatory Visit: Payer: Self-pay | Admitting: Adult Health

## 2022-02-10 DIAGNOSIS — E039 Hypothyroidism, unspecified: Secondary | ICD-10-CM

## 2022-02-14 ENCOUNTER — Other Ambulatory Visit (INDEPENDENT_AMBULATORY_CARE_PROVIDER_SITE_OTHER): Payer: Medicare Other

## 2022-02-14 DIAGNOSIS — E039 Hypothyroidism, unspecified: Secondary | ICD-10-CM | POA: Diagnosis not present

## 2022-02-18 LAB — TSH: TSH: 4.38 u[IU]/mL (ref 0.35–5.50)

## 2022-02-19 MED ORDER — LEVOTHYROXINE SODIUM 125 MCG PO TABS: 125.0000 ug | ORAL_TABLET | Freq: Every day | ORAL | 3 refills | Status: DC

## 2022-02-25 ENCOUNTER — Ambulatory Visit (INDEPENDENT_AMBULATORY_CARE_PROVIDER_SITE_OTHER): Payer: Medicare Other

## 2022-02-25 VITALS — BP 108/60 | HR 77 | Temp 98.1°F | Ht 68.5 in | Wt 220.7 lb

## 2022-02-25 DIAGNOSIS — Z0000 Encounter for general adult medical examination without abnormal findings: Secondary | ICD-10-CM

## 2022-02-25 NOTE — Patient Instructions (Signed)
Fernando Combs , ?Thank you for taking time to come for your Medicare Wellness Visit. I appreciate your ongoing commitment to your health goals. Please review the following plan we discussed and let me know if I can assist you in the future.  ? ?Screening recommendations/referrals: ?Colonoscopy: decline ?Recommended yearly ophthalmology/optometry visit for glaucoma screening and checkup ?Recommended yearly dental visit for hygiene and checkup ? ?Vaccinations: ?Influenza vaccine: due next flu season ?Pneumococcal vaccine: completed 12/05/2015 ?Tdap vaccine: completed 06/30/2014, due 06/30/2024 ?Shingles vaccine: discussed   ?Covid-19:  02/11/2020, 03/10/2020, 09/24/2020 ? ?Advanced directives: Advance directive discussed with you today. Even though you declined this today please call our office should you change your mind and we can give you the proper paperwork for you to fill out. ? ?Conditions/risks identified: none ? ?Next appointment: Follow up in one year for your annual wellness visit.  ? ?Preventive Care 41 Years and Older, Male ?Preventive care refers to lifestyle choices and visits with your health care provider that can promote health and wellness. ?What does preventive care include? ?A yearly physical exam. This is also called an annual well check. ?Dental exams once or twice a year. ?Routine eye exams. Ask your health care provider how often you should have your eyes checked. ?Personal lifestyle choices, including: ?Daily care of your teeth and gums. ?Regular physical activity. ?Eating a healthy diet. ?Avoiding tobacco and drug use. ?Limiting alcohol use. ?Practicing safe sex. ?Taking low doses of aspirin every day. ?Taking vitamin and mineral supplements as recommended by your health care provider. ?What happens during an annual well check? ?The services and screenings done by your health care provider during your annual well check will depend on your age, overall health, lifestyle risk factors, and family history  of disease. ?Counseling  ?Your health care provider may ask you questions about your: ?Alcohol use. ?Tobacco use. ?Drug use. ?Emotional well-being. ?Home and relationship well-being. ?Sexual activity. ?Eating habits. ?History of falls. ?Memory and ability to understand (cognition). ?Work and work Statistician. ?Screening  ?You may have the following tests or measurements: ?Height, weight, and BMI. ?Blood pressure. ?Lipid and cholesterol levels. These may be checked every 5 years, or more frequently if you are over 44 years old. ?Skin check. ?Lung cancer screening. You may have this screening every year starting at age 80 if you have a 30-pack-year history of smoking and currently smoke or have quit within the past 15 years. ?Fecal occult blood test (FOBT) of the stool. You may have this test every year starting at age 64. ?Flexible sigmoidoscopy or colonoscopy. You may have a sigmoidoscopy every 5 years or a colonoscopy every 10 years starting at age 38. ?Prostate cancer screening. Recommendations will vary depending on your family history and other risks. ?Hepatitis C blood test. ?Hepatitis B blood test. ?Sexually transmitted disease (STD) testing. ?Diabetes screening. This is done by checking your blood sugar (glucose) after you have not eaten for a while (fasting). You may have this done every 1-3 years. ?Abdominal aortic aneurysm (AAA) screening. You may need this if you are a current or former smoker. ?Osteoporosis. You may be screened starting at age 44 if you are at high risk. ?Talk with your health care provider about your test results, treatment options, and if necessary, the need for more tests. ?Vaccines  ?Your health care provider may recommend certain vaccines, such as: ?Influenza vaccine. This is recommended every year. ?Tetanus, diphtheria, and acellular pertussis (Tdap, Td) vaccine. You may need a Td booster every 10 years. ?Zoster  vaccine. You may need this after age 63. ?Pneumococcal 13-valent  conjugate (PCV13) vaccine. One dose is recommended after age 24. ?Pneumococcal polysaccharide (PPSV23) vaccine. One dose is recommended after age 21. ?Talk to your health care provider about which screenings and vaccines you need and how often you need them. ?This information is not intended to replace advice given to you by your health care provider. Make sure you discuss any questions you have with your health care provider. ?Document Released: 12/07/2015 Document Revised: 07/30/2016 Document Reviewed: 09/11/2015 ?Elsevier Interactive Patient Education ? 2017 [employer]. ? ?Fall Prevention in the Home ?Falls can cause injuries. They can happen to people of all ages. There are many things you can do to make your home safe and to help prevent falls. ?What can I do on the outside of my home? ?Regularly fix the edges of walkways and driveways and fix any cracks. ?Remove anything that might make you trip as you walk through a door, such as a raised step or threshold. ?Trim any bushes or trees on the path to your home. ?Use bright outdoor lighting. ?Clear any walking paths of anything that might make someone trip, such as rocks or tools. ?Regularly check to see if handrails are loose or broken. Make sure that both sides of any steps have handrails. ?Any raised decks and porches should have guardrails on the edges. ?Have any leaves, snow, or ice cleared regularly. ?Use sand or salt on walking paths during winter. ?Clean up any spills in your garage right away. This includes oil or grease spills. ?What can I do in the bathroom? ?Use night lights. ?Install grab bars by the toilet and in the tub and shower. Do not use towel bars as grab bars. ?Use non-skid mats or decals in the tub or shower. ?If you need to sit down in the shower, use a plastic, non-slip stool. ?Keep the floor dry. Clean up any water that spills on the floor as soon as it happens. ?Remove soap buildup in the tub or shower regularly. ?Attach bath mats  securely with double-sided non-slip rug tape. ?Do not have throw rugs and other things on the floor that can make you trip. ?What can I do in the bedroom? ?Use night lights. ?Make sure that you have a light by your bed that is easy to reach. ?Do not use any sheets or blankets that are too big for your bed. They should not hang down onto the floor. ?Have a firm chair that has side arms. You can use this for support while you get dressed. ?Do not have throw rugs and other things on the floor that can make you trip. ?What can I do in the kitchen? ?Clean up any spills right away. ?Avoid walking on wet floors. ?Keep items that you use a lot in easy-to-reach places. ?If you need to reach something above you, use a strong step stool that has a grab bar. ?Keep electrical cords out of the way. ?Do not use floor polish or wax that makes floors slippery. If you must use wax, use non-skid floor wax. ?Do not have throw rugs and other things on the floor that can make you trip. ?What can I do with my stairs? ?Do not leave any items on the stairs. ?Make sure that there are handrails on both sides of the stairs and use them. Fix handrails that are broken or loose. Make sure that handrails are as long as the stairways. ?Check any carpeting to make sure that it  is firmly attached to the stairs. Fix any carpet that is loose or worn. ?Avoid having throw rugs at the top or bottom of the stairs. If you do have throw rugs, attach them to the floor with carpet tape. ?Make sure that you have a light switch at the top of the stairs and the bottom of the stairs. If you do not have them, ask someone to add them for you. ?What else can I do to help prevent falls? ?Wear shoes that: ?Do not have high heels. ?Have rubber bottoms. ?Are comfortable and fit you well. ?Are closed at the toe. Do not wear sandals. ?If you use a stepladder: ?Make sure that it is fully opened. Do not climb a closed stepladder. ?Make sure that both sides of the stepladder  are locked into place. ?Ask someone to hold it for you, if possible. ?Clearly mark and make sure that you can see: ?Any grab bars or handrails. ?First and last steps. ?Where the edge of each step is. ?Use to

## 2022-02-25 NOTE — Progress Notes (Signed)
?This visit occurred during the SARS-CoV-2 public health emergency.  Safety protocols were in place, including screening questions prior to the visit, additional usage of staff PPE, and extensive cleaning of exam room while observing appropriate contact time as indicated for disinfecting solutions. ? ?Subjective:  ? Fernando Combs is a 76 y.o. male who presents for Medicare Annual/Subsequent preventive examination. ? ?Review of Systems    ? ?Cardiac Risk Factors include: advanced age (>43mn, >>36women);diabetes mellitus;dyslipidemia;hypertension;male gender;obesity (BMI >30kg/m2) ? ?   ?Objective:  ?  ?Today's Vitals  ? 02/25/22 1212 02/25/22 1218  ?BP: 108/60   ?Pulse: 77   ?Temp: 98.1 ?F (36.7 ?C)   ?TempSrc: Oral   ?SpO2: 94%   ?Weight: 220 lb 11.2 oz (100.1 kg)   ?Height: 5' 8.5" (1.74 m)   ?PainSc:  5   ? ?Body mass index is 33.07 kg/m?. ? ? ?  02/25/2022  ? 12:26 PM 08/31/2018  ? 10:21 AM 08/27/2017  ? 10:02 AM 12/22/2016  ?  9:36 AM 10/29/2016  ? 10:51 AM 12/18/2015  ? 12:24 PM 05/15/2015  ? 10:06 PM  ?Advanced Directives  ?Does Patient Have a Medical Advance Directive? No No No No No No Yes  ?Type of Advance Directive       Healthcare Power of Attorney  ?Would patient like information on creating a medical advance directive? No - Patient declined        ? ? ?Current Medications (verified) ?Outpatient Encounter Medications as of 02/25/2022  ?Medication Sig  ? allopurinol (ZYLOPRIM) 100 MG tablet TAKE 2 TABLETS BY MOUTH  DAILY  ? amLODipine (NORVASC) 5 MG tablet TAKE 1 TABLET BY MOUTH  DAILY  ? aspirin 81 MG chewable tablet Chew 1 tablet (81 mg total) by mouth daily.  ? atorvastatin (LIPITOR) 80 MG tablet TAKE 1 TABLET BY MOUTH  DAILY AT 6 P.M  ? carvedilol (COREG) 25 MG tablet TAKE 1 TABLET BY MOUTH  TWICE DAILY WITH MEALS  ? Cholecalciferol (VITAMIN D-3) 125 MCG (5000 UT) TABS Take 1 tablet by mouth daily.  ? isosorbide mononitrate (IMDUR) 60 MG 24 hr tablet TAKE 1 TABLET BY MOUTH  DAILY  ? levothyroxine  (SYNTHROID) 125 MCG tablet Take 1 tablet (125 mcg total) by mouth daily.  ? losartan (COZAAR) 25 MG tablet Take 25 mg by mouth daily.  ? metFORMIN (GLUCOPHAGE) 500 MG tablet TAKE 1 TABLET BY MOUTH  TWICE DAILY WITH MEALS  ? nitroGLYCERIN (NITROSTAT) 0.4 MG SL tablet DISSOLVE 1 TABLET UNDER THE TONGUE EVERY 5 MINUTES AS  NEEDED FOR CHEST PAIN FOR  UP TO 3 DOSES. SEEK MEDICAL ATTENTION IF NO RELIEF.  ? Omega-3 Fatty Acids (FISH OIL) 1000 MG CPDR Take by mouth.  ? sertraline (ZOLOFT) 50 MG tablet TAKE 1 TABLET BY MOUTH  DAILY  ? tamsulosin (FLOMAX) 0.4 MG CAPS capsule TAKE 2 CAPSULES BY MOUTH  DAILY  ? traZODone (DESYREL) 50 MG tablet Take 0.5-1 tablets (25-50 mg total) by mouth at bedtime as needed for sleep.  ? gabapentin (NEURONTIN) 100 MG capsule Take 1 capsule (100 mg total) by mouth 3 (three) times daily.  ? tadalafil (CIALIS) 20 MG tablet TAKE ONE TABLET BY MOUTH 30 MINUTES PRIOR TO INTERCOURSE. (Patient not taking: Reported on 02/25/2022)  ? ?No facility-administered encounter medications on file as of 02/25/2022.  ? ? ?Allergies (verified) ?Patient has no known allergies.  ? ?History: ?Past Medical History:  ?Diagnosis Date  ? Benign prostatic hypertrophy   ? CAD (coronary artery disease)   ?  cath 05/16/2015 patent LIMA to LAD, SVG to OM, SVG to RPDA, SVG to diagonal was occluded. PTCA 95% prox LAD reduce down to 60%. Medical management  ? Carotid artery occlusion   ? Colitis, ischemic ([place]) 06/2005  ?  history of  ? Diabetes mellitus type II   ? Diabetes, polyneuropathy ([place])   ? Diabetic peripheral neuropathy ([place])   ? DVT (deep venous thrombosis) ([place])   ? Gout   ? History of myocardial infarction   ? History of syncope   ? most likely vasovagal  ? History of tobacco abuse   ? Hyperlipidemia   ? Hypertension   ? Hypothyroidism   ? Myocardial infarction [facility])   ? Peripheral vascular disease ([place])   ? Shingles   ? Thrombus 04/2008  ? acute thrombus of his right superficial artery with claudication  ? ?Past Surgical  History:  ?Procedure Laterality Date  ? CARDIAC CATHETERIZATION N/A 05/15/2015  ? Procedure: Left Heart Cath and Cors/Grafts Angiography;  Surgeon: Troy Sine, MD;  Location: [facility];  Service: Cardiovascular;  Laterality: N/A;  ? CATARACT EXTRACTION Bilateral   ? CORONARY ARTERY BYPASS GRAFT  08/04/00  ? times 4 (left interanl mammary artery to the left anterior descending, saphenous vein,graft to diagonal, saphenous vein graft to obtuse marginal, spahenous vein graft to posterior descending-Surgeon  Tharon Aquas Tright III  ? LOWER EXTREMITY ANGIOGRAM  March 09, 2012  ? LOWER EXTREMITY ANGIOGRAM Right 03/09/2012  ? Procedure: LOWER EXTREMITY ANGIOGRAM;  Surgeon: Serafina Mitchell, MD;  Location[facility] CATH LAB;  Service: Cardiovascular;  Laterality: Right;  ? OTHER SURGICAL HISTORY  11/2010  ? angiogram, right leg stent placement-  -Dr Kellie Simmering  ? peripheral vascular surgery    ? PR VEIN BYPASS GRAFT,AORTO-FEM-POP  08/04/00  ? ?Family History  ?Problem Relation Age of Onset  ? Deep vein thrombosis Mother   ?     Varicose Veins  ? Heart disease Mother   ? Hyperlipidemia Mother   ? Hypertension Mother   ? Peripheral vascular disease Mother   ? Lung cancer Mother   ? Stroke Father   ? Deep vein thrombosis Father   ? Diabetes Father   ? Heart disease Father   ?     Heart Disease  before age 63  ? Hyperlipidemia Father   ? Hypertension Father   ? Heart attack Father   ? Peripheral vascular disease Father   ? Alcohol abuse Unknown   ? Stroke Unknown   ? Coronary artery disease Unknown   ? Hyperlipidemia Unknown   ? Deep vein thrombosis Brother   ? Diabetes Brother   ? Heart disease Brother   ?     Heart Disease before age 5  ? Hyperlipidemia Brother   ? Hypertension Brother   ? Heart attack Brother   ? Peripheral vascular disease Brother   ? Lung cancer Sister   ? Kidney cancer Brother   ?     on hospice   ? Other Neg Hx   ?     denies family history of blood clots or blood disorder  ? ?Social History  ? ?Socioeconomic  History  ? Marital status: Married  ?  Spouse name: Not on file  ? Number of children: Not on file  ? Years of education: Not on file  ? Highest education level: Not on file  ?Occupational History  ? Not on file  ?Tobacco Use  ? Smoking status: Former  ?  Packs/day: 1.00  ?  Years: 50.00  ?  Pack years: 50.00  ?  Types: Cigarettes  ?  Quit date: 04/14/2011  ?  Years since quitting: 10.8  ? Smokeless tobacco: Never  ?Vaping Use  ? Vaping Use: Never used  ?Substance and Sexual Activity  ? Alcohol use: No  ? Drug use: No  ? Sexual activity: Not on file  ?Other Topics Concern  ? Not on file  ?Social History Narrative  ? Occupation:  Retired from [employer]   ? Married - lives with wife (2nd marriage)  ? 3 children    ? Alcohol use-no  ? Tobacco Use - Quit smoking 3 years ago   ?      ? ?Social Determinants of Health  ? ?Financial Resource Strain: Low Risk   ? Difficulty of Paying Living Expenses: Not hard at all  ?Food Insecurity: Food Insecurity Present  ? Worried About Charity fundraiser in the Last Year: Sometimes true  ? Ran Out of Food in the Last Year: Sometimes true  ?Transportation Needs: No Transportation Needs  ? Lack of Transportation (Medical): No  ? Lack of Transportation (Non-Medical): No  ?Physical Activity: Inactive  ? Days of Exercise per Week: 0 days  ? Minutes of Exercise per Session: 0 min  ?Stress: No Stress Concern Present  ? Feeling of Stress : Not at all  ?Social Connections: Not on file  ? ? ?Tobacco Counseling ?Counseling given: Not Answered ? ? ?Clinical Intake: ? ?Pre-visit preparation completed: Yes ? ?Pain : 0-10 ?Pain Score: 5  ?Pain Type: Chronic pain ?Pain Location: Foot ?Pain Orientation: Right, Left ?Pain Descriptors / Indicators: Numbness, Tingling ?Pain Onset: More than a month ago ?Pain Frequency: Constant ? ?  ? ?Nutritional Status: BMI > 30  Obese ?Nutritional Risks: None ?Diabetes: Yes ? ?How often do you need to have someone help you when you read instructions,  pamphlets, or other written materials from your doctor or pharmacy?: 1 - Never ?What is the last grade level you completed in school?: some college ? ?Diabetic? Yes ?Nutrition Risk Assessment: ? ?Has the patient ha

## 2022-03-17 ENCOUNTER — Encounter: Payer: Self-pay | Admitting: Family Medicine

## 2022-03-17 ENCOUNTER — Telehealth (INDEPENDENT_AMBULATORY_CARE_PROVIDER_SITE_OTHER): Payer: Medicare Other | Admitting: Family Medicine

## 2022-03-17 VITALS — Ht 68.5 in

## 2022-03-17 DIAGNOSIS — J988 Other specified respiratory disorders: Secondary | ICD-10-CM | POA: Diagnosis not present

## 2022-03-17 DIAGNOSIS — R051 Acute cough: Secondary | ICD-10-CM | POA: Diagnosis not present

## 2022-03-17 DIAGNOSIS — J989 Respiratory disorder, unspecified: Secondary | ICD-10-CM | POA: Diagnosis not present

## 2022-03-17 MED ORDER — PREDNISONE 20 MG PO TABS: 40.0000 mg | ORAL_TABLET | Freq: Every day | ORAL | 0 refills | Status: AC

## 2022-03-17 MED ORDER — DOXYCYCLINE HYCLATE 100 MG PO TABS: 100.0000 mg | ORAL_TABLET | Freq: Two times a day (BID) | ORAL | 0 refills | Status: AC

## 2022-03-17 MED ORDER — BENZONATATE 100 MG PO CAPS: 200.0000 mg | ORAL_CAPSULE | Freq: Two times a day (BID) | ORAL | 0 refills | Status: AC | PRN

## 2022-03-17 NOTE — Progress Notes (Addendum)
Virtual Visit via Telephone Note ?I connected with Fernando Combs on 03/17/22 at  4:00 PM EDT by telephone and verified that I am speaking with the correct person using two identifiers. ?  ?I discussed the limitations, risks, security and privacy concerns of performing an evaluation and management service by telephone and the availability of in person appointments. I also discussed with the patient that there may be a patient responsible charge related to this service. The patient expressed understanding and agreed to proceed. ? ?Location patient: home ?Location provider: work office ?Participants present for the call: patient, provider ?Patient did not have a visit in the prior 7 days to address this/these issue(s). ? ?Chief Complaint  ?Patient presents with  ? Bronchitis  ?  Congestion, productive cough & sore throat x a week. No fever.  ? ?History of Present Illness: ?Fernando Combs is a 76 y.o.male with hx of CAD,DM II,HTN,and HTN c/o a week of respiratory symptoms as described above. ?+Fatigue, decreased appetite, rhinorrhea, postnasal drainage, nasal congestion, productive cough with clearish sputum. ?Negative for hemoptysis. ?He has not noted fever or body aches, he has had chills intermittently. ? ?Dyspnea and wheezing, mainly at night.  No history of COPD or asthma. ?He tried albuterol inhaler, provided by his neighbor, did not feel like it helped. ?Last night symptoms were worse. ?We could not lie down because coughing spells. ? ?He has taking OTC cough meds, which helps some. ? ?Negative for CP, palpitations,orthopnea,PND, or unusual LE edema. ?+ Diarrhea, today he has had 2 stools, he had 2 yesterday. ?Negative for mucus or blood. ?No associated abdominal pain, nausea, vomiting, urinary symptoms, or skin rash. ? ?Negative for sick contacts or recent travel. ?Checking BS and they are "good." ? ?Observations/Objective: ?Patient sounds cheerful and well on the phone. ?I do not appreciate any productive  cough and minimal wheezing at time. Sometimes during initial part of the visit he seemed to be short of breath  but better after coughing a few times. ?Speech and thought processing are grossly intact. ?Patient reported vitals:Ht 5' 8.5" (1.74 m)   BMI 33.07 kg/m?  ? ?Assessment and Plan: ? ?1. Respiratory tract infection ?Most likely viral,in which case abx will not help. Because risk factors for serious respiratory illness, recommend Doxycycline 100 mg bid x 7 days. He could hold on abx x 24-48 hrs and see if you prednisone alone helps with symptoms. ?CXR tomorrow. ?He is feeling better today, so I do not think he needs to go to the ER now but if any worsening symptoms he was instructed to do so. ?We discussed some side effects of abx, which includes worsening diarrhea. ?Adequate hydration and rest. ? ?- doxycycline (VIBRA-TABS) 100 MG tablet; Take 1 tablet (100 mg total) by mouth 2 (two) times daily for 7 days.  Dispense: 14 tablet; Refill: 0 ?- DG Chest 2 View; Future ? ?2. Reactive airway disease without asthma ?We discussed some side effects of prednisone, he has taken medication in the past with no side effects.  Recommend taking prednisone 40 mg daily with breakfast. ?Continue monitoring BS closely. ?Albuterol inh 2 puff every 6 hours for a week then as needed for wheezing or shortness of breath.  ? ?- predniSONE (DELTASONE) 20 MG tablet; Take 2 tablets (40 mg total) by mouth daily with breakfast for 3 days.  Dispense: 6 tablet; Refill: 0 ?- DG Chest 2 View; Future ? ?3. Acute cough ?Adequate hydration. ?Prednisone and albuterol will help. ?Benzonatate for symptomatic treatment. ?  OTC plain Mucinex will also help. ?CXR to be done tomorrow. ? ?- benzonatate (TESSALON) 100 MG capsule; Take 2 capsules (200 mg total) by mouth 2 (two) times daily as needed for up to 10 days.  Dispense: 20 capsule; Refill: 0 ?- DG Chest 2 View; Future ? ?Follow Up Instructions: ? ?Return for CXR tomorrow. F/U with PCP in a  week.. ? ?I did not refer this patient for an OV in the next 24 hours for this/these issue(s). ? ?I discussed the assessment and treatment plan with the patient. The patient was provided an opportunity to ask questions and all were answered. The patient agreed with the plan and demonstrated an understanding of the instructions. ?  ?The patient was advised to call back or seek an in-person evaluation if the symptoms worsen or if the condition fails to improve as anticipated. ? ?I provided 12 minutes of non-face-to-face time during this encounter. ? ?Betty G. Martinique, MD ? ?[place]. ?[place] office. ? ? ?

## 2022-03-18 ENCOUNTER — Other Ambulatory Visit: Payer: Medicare Other

## 2022-03-18 ENCOUNTER — Ambulatory Visit (INDEPENDENT_AMBULATORY_CARE_PROVIDER_SITE_OTHER): Payer: Medicare Other

## 2022-03-18 DIAGNOSIS — J988 Other specified respiratory disorders: Secondary | ICD-10-CM

## 2022-03-18 DIAGNOSIS — R051 Acute cough: Secondary | ICD-10-CM

## 2022-03-18 DIAGNOSIS — R062 Wheezing: Secondary | ICD-10-CM | POA: Diagnosis not present

## 2022-03-18 DIAGNOSIS — R0602 Shortness of breath: Secondary | ICD-10-CM | POA: Diagnosis not present

## 2022-03-18 DIAGNOSIS — J989 Respiratory disorder, unspecified: Secondary | ICD-10-CM | POA: Diagnosis not present

## 2022-03-22 ENCOUNTER — Other Ambulatory Visit: Payer: Self-pay | Admitting: Adult Health

## 2022-03-25 ENCOUNTER — Encounter: Payer: Self-pay | Admitting: Adult Health

## 2022-03-25 ENCOUNTER — Ambulatory Visit (INDEPENDENT_AMBULATORY_CARE_PROVIDER_SITE_OTHER): Payer: Medicare Other | Admitting: Adult Health

## 2022-03-25 VITALS — BP 110/70 | HR 76 | Temp 98.8°F | Ht 68.5 in | Wt 208.0 lb

## 2022-03-25 DIAGNOSIS — R051 Acute cough: Secondary | ICD-10-CM

## 2022-03-25 DIAGNOSIS — J0101 Acute recurrent maxillary sinusitis: Secondary | ICD-10-CM | POA: Diagnosis not present

## 2022-03-25 MED ORDER — HYDROCODONE BIT-HOMATROP MBR 5-1.5 MG/5ML PO SOLN: 5.0000 mL | Freq: Three times a day (TID) | ORAL | 0 refills | Status: DC | PRN

## 2022-03-25 MED ORDER — DOXYCYCLINE HYCLATE 100 MG PO CAPS: 100.0000 mg | ORAL_CAPSULE | Freq: Two times a day (BID) | ORAL | 0 refills | Status: DC

## 2022-03-25 NOTE — Progress Notes (Signed)
? ?Subjective:  ? ? Patient ID: Fernando Combs, male    DOB: 02/02/1946, 76 y.o.   MRN: [id number] ? ?HPI ? ?76 year old male who  has a past medical history of Benign prostatic hypertrophy, CAD (coronary artery disease), Carotid artery occlusion, Colitis, ischemic ([place]) (06/2005), Diabetes mellitus type II, Diabetes, polyneuropathy ([place]), Diabetic peripheral neuropathy ([place]), DVT (deep venous thrombosis) ([place]), Gout, History of myocardial infarction, History of syncope, History of tobacco abuse, Hyperlipidemia, Hypertension, Hypothyroidism, Myocardial infarction [facility]), Peripheral vascular disease ([place]), Shingles, and Thrombus (04/2008). ? ?He presents to the office today for follow-up regarding an acute respiratory infection.  He was seen roughly 8 days ago by another provider in the office for virtual visit for symptoms including fatigue, decreased appetite, rhinorrhea, PND, nasal congestion, productive cough. ? ?He was prescribed prednisone 40 mg daily x3 days and if no improvement then start doxycycline twice daily x7 days.  His chest x-ray at this time was without acute abnormality. ? ?Today he reports that he feels about 50% better and has only started to improve within the last 48 hours.  He continues to have productive cough at night, sinus pressure, sinus congestion, and sinus headache.  Cough does keep him up at night ? ?Denies fevers or chills ? ?Review of Systems ?See HPI  ? ?Past Medical History:  ?Diagnosis Date  ? Benign prostatic hypertrophy   ? CAD (coronary artery disease)   ? cath 05/16/2015 patent LIMA to LAD, SVG to OM, SVG to RPDA, SVG to diagonal was occluded. PTCA 95% prox LAD reduce down to 60%. Medical management  ? Carotid artery occlusion   ? Colitis, ischemic ([place]) 06/2005  ?  history of  ? Diabetes mellitus type II   ? Diabetes, polyneuropathy ([place])   ? Diabetic peripheral neuropathy ([place])   ? DVT (deep venous thrombosis) ([place])   ? Gout   ? History of myocardial infarction   ? History of  syncope   ? most likely vasovagal  ? History of tobacco abuse   ? Hyperlipidemia   ? Hypertension   ? Hypothyroidism   ? Myocardial infarction [facility])   ? Peripheral vascular disease ([place])   ? Shingles   ? Thrombus 04/2008  ? acute thrombus of his right superficial artery with claudication  ? ? ?Social History  ? ?Socioeconomic History  ? Marital status: Married  ?  Spouse name: Not on file  ? Number of children: Not on file  ? Years of education: Not on file  ? Highest education level: Not on file  ?Occupational History  ? Not on file  ?Tobacco Use  ? Smoking status: Former  ?  Packs/day: 1.00  ?  Years: 50.00  ?  Pack years: 50.00  ?  Types: Cigarettes  ?  Quit date: 04/14/2011  ?  Years since quitting: 10.9  ? Smokeless tobacco: Never  ?Vaping Use  ? Vaping Use: Never used  ?Substance and Sexual Activity  ? Alcohol use: No  ? Drug use: No  ? Sexual activity: Not on file  ?Other Topics Concern  ? Not on file  ?Social History Narrative  ? Occupation:  Retired from [employer]   ? Married - lives with wife (2nd marriage)  ? 3 children    ? Alcohol use-no  ? Tobacco Use - Quit smoking 3 years ago   ?      ? ?Social Determinants of Health  ? ?Financial Resource Strain: Low Risk   ?  Difficulty of Paying Living Expenses: Not hard at all  ?Food Insecurity: Food Insecurity Present  ? Worried About Charity fundraiser in the Last Year: Sometimes true  ? Ran Out of Food in the Last Year: Sometimes true  ?Transportation Needs: No Transportation Needs  ? Lack of Transportation (Medical): No  ? Lack of Transportation (Non-Medical): No  ?Physical Activity: Inactive  ? Days of Exercise per Week: 0 days  ? Minutes of Exercise per Session: 0 min  ?Stress: No Stress Concern Present  ? Feeling of Stress : Not at all  ?Social Connections: Not on file  ?Intimate Partner Violence: Not on file  ? ? ?Past Surgical History:  ?Procedure Laterality Date  ? CARDIAC CATHETERIZATION N/A 05/15/2015  ? Procedure: Left Heart Cath and  Cors/Grafts Angiography;  Surgeon: Troy Sine, MD;  Location: [place] [facility];  Service: Cardiovascular;  Laterality: N/A;  ? CATARACT EXTRACTION Bilateral   ? CORONARY ARTERY BYPASS GRAFT  08/04/00  ? times 4 (left interanl mammary artery to the left anterior descending, saphenous vein,graft to diagonal, saphenous vein graft to obtuse marginal, spahenous vein graft to posterior descending-Surgeon  Tharon Aquas Tright III  ? LOWER EXTREMITY ANGIOGRAM  March 09, 2012  ? LOWER EXTREMITY ANGIOGRAM Right 03/09/2012  ? Procedure: LOWER EXTREMITY ANGIOGRAM;  Surgeon: Serafina Mitchell, MD;  Location: [facility] CATH LAB;  Service: Cardiovascular;  Laterality: Right;  ? OTHER SURGICAL HISTORY  11/2010  ? angiogram, right leg stent placement-  -Dr Kellie Simmering  ? peripheral vascular surgery    ? PR VEIN BYPASS GRAFT,AORTO-FEM-POP  08/04/00  ? ? ?Family History  ?Problem Relation Age of Onset  ? Deep vein thrombosis Mother   ?     Varicose Veins  ? Heart disease Mother   ? Hyperlipidemia Mother   ? Hypertension Mother   ? Peripheral vascular disease Mother   ? Lung cancer Mother   ? Stroke Father   ? Deep vein thrombosis Father   ? Diabetes Father   ? Heart disease Father   ?     Heart Disease  before age 12  ? Hyperlipidemia Father   ? Hypertension Father   ? Heart attack Father   ? Peripheral vascular disease Father   ? Alcohol abuse Unknown   ? Stroke Unknown   ? Coronary artery disease Unknown   ? Hyperlipidemia Unknown   ? Deep vein thrombosis Brother   ? Diabetes Brother   ? Heart disease Brother   ?     Heart Disease before age 73  ? Hyperlipidemia Brother   ? Hypertension Brother   ? Heart attack Brother   ? Peripheral vascular disease Brother   ? Lung cancer Sister   ? Kidney cancer Brother   ?     on hospice   ? Other Neg Hx   ?     denies family history of blood clots or blood disorder  ? ? ?No Known Allergies ? ?Current Outpatient Medications on File Prior to Visit  ?Medication Sig Dispense Refill  ? allopurinol (ZYLOPRIM) 100  MG tablet TAKE 2 TABLETS BY MOUTH  DAILY 180 tablet 3  ? amLODipine (NORVASC) 5 MG tablet TAKE 1 TABLET BY MOUTH  DAILY 30 tablet 0  ? aspirin 81 MG chewable tablet Chew 1 tablet (81 mg total) by mouth daily.    ? atorvastatin (LIPITOR) 80 MG tablet TAKE 1 TABLET BY MOUTH  DAILY AT 6 P.M 90 tablet 3  ? benzonatate (  TESSALON) 100 MG capsule Take 2 capsules (200 mg total) by mouth 2 (two) times daily as needed for up to 10 days. 20 capsule 0  ? carvedilol (COREG) 25 MG tablet TAKE 1 TABLET BY MOUTH  TWICE DAILY WITH MEALS 60 tablet 0  ? Cholecalciferol (VITAMIN D-3) 125 MCG (5000 UT) TABS Take 1 tablet by mouth daily. 90 tablet 3  ? isosorbide mononitrate (IMDUR) 60 MG 24 hr tablet TAKE 1 TABLET BY MOUTH  DAILY 30 tablet 0  ? levothyroxine (SYNTHROID) 125 MCG tablet Take 1 tablet (125 mcg total) by mouth daily. 90 tablet 3  ? losartan (COZAAR) 25 MG tablet Take 25 mg by mouth daily.    ? metFORMIN (GLUCOPHAGE) 500 MG tablet TAKE 1 TABLET BY MOUTH  TWICE DAILY WITH MEALS 180 tablet 3  ? nitroGLYCERIN (NITROSTAT) 0.4 MG SL tablet DISSOLVE 1 TABLET UNDER THE TONGUE EVERY 5 MINUTES AS  NEEDED FOR CHEST PAIN FOR  UP TO 3 DOSES. SEEK MEDICAL ATTENTION IF NO RELIEF. 90 tablet 3  ? Omega-3 Fatty Acids (FISH OIL) 1000 MG CPDR Take by mouth.    ? sertraline (ZOLOFT) 50 MG tablet TAKE 1 TABLET BY MOUTH  DAILY 90 tablet 3  ? tadalafil (CIALIS) 20 MG tablet TAKE ONE TABLET BY MOUTH 30 MINUTES PRIOR TO INTERCOURSE. 30 tablet 5  ? tamsulosin (FLOMAX) 0.4 MG CAPS capsule TAKE 2 CAPSULES BY MOUTH  DAILY 180 capsule 3  ? traZODone (DESYREL) 50 MG tablet Take 0.5-1 tablets (25-50 mg total) by mouth at bedtime as needed for sleep. 90 tablet 1  ? gabapentin (NEURONTIN) 100 MG capsule Take 1 capsule (100 mg total) by mouth 3 (three) times daily. 270 capsule 1  ? gabapentin (NEURONTIN) 300 MG capsule Take 300 mg by mouth 3 (three) times daily.    ? ?No current facility-administered medications on file prior to visit.  ? ? ?BP 110/70    Pulse 76   Temp 98.8 ?F (37.1 ?C) (Oral)   Ht 5' 8.5" (1.74 m)   Wt 208 lb (94.3 kg)   SpO2 97%   BMI 31.17 kg/m?  ? ?   ?Objective:  ? Physical Exam ?Vitals and nursing note reviewed.  ?Constitutional:   ?   Appea

## 2022-04-15 ENCOUNTER — Ambulatory Visit (INDEPENDENT_AMBULATORY_CARE_PROVIDER_SITE_OTHER): Payer: Self-pay | Admitting: Podiatry

## 2022-04-15 DIAGNOSIS — Z91199 Patient's noncompliance with other medical treatment and regimen due to unspecified reason: Secondary | ICD-10-CM

## 2022-04-16 NOTE — Progress Notes (Signed)
   Complete physical exam  Patient: Fernando Combs   DOB: 09/13/2000   76 y.o. Male  MRN: [id number]  Subjective:    No chief complaint on file.   Fernando Combs is a 76 y.o. male who presents today for a complete physical exam. She reports consuming a {diet types:17450} diet. {types:19826} She generally feels {DESC; WELL/FAIRLY WELL/POORLY:18703}. She reports sleeping {DESC; WELL/FAIRLY WELL/POORLY:18703}. She {does/does not:200015} have additional problems to discuss today.    Most recent fall risk assessment:    05/21/2022   10:42 AM  Fall Risk   Falls in the past year? 0  Number falls in past yr: 0  Injury with Fall? 0  Risk for fall due to : No Fall Risks  Follow up Falls evaluation completed     Most recent depression screenings:    05/21/2022   10:42 AM 04/11/2021   10:46 AM  PHQ 2/9 Scores  PHQ - 2 Score 0 0  PHQ- 9 Score 5     {VISON DENTAL STD PSA (Optional):27386}  {History (Optional):23778}  Patient Care Team: Jessup, Joy, NP as PCP - General (Nurse Practitioner)   Outpatient Medications Prior to Visit  Medication Sig   fluticasone (FLONASE) 50 MCG/ACT nasal spray Place 2 sprays into both nostrils in the morning and at bedtime. After 7 days, reduce to once daily.   norgestimate-ethinyl estradiol (SPRINTEC 28) 0.25-35 MG-MCG tablet Take 1 tablet by mouth daily.   Nystatin POWD Apply liberally to affected area 2 times per day   spironolactone (ALDACTONE) 100 MG tablet Take 1 tablet (100 mg total) by mouth daily.   No facility-administered medications prior to visit.    ROS        Objective:     There were no vitals taken for this visit. {Vitals History (Optional):23777}  Physical Exam   No results found for any visits on 06/26/22. {Show previous labs (optional):23779}    Assessment & Plan:    Routine Health Maintenance and Physical Exam  Immunization History  Administered Date(s) Administered   DTaP 11/27/1999, 01/23/2000,  04/02/2000, 12/17/2000, 07/02/2004   Hepatitis A 04/28/2008, 05/04/2009   Hepatitis B 09/14/1999, 10/22/1999, 04/02/2000   HiB (PRP-OMP) 11/27/1999, 01/23/2000, 04/02/2000, 12/17/2000   IPV 11/27/1999, 01/23/2000, 09/21/2000, 07/02/2004   Influenza,inj,Quad PF,6+ Mos 08/04/2014   Influenza-Unspecified 11/03/2012   MMR 09/21/2001, 07/02/2004   Meningococcal Polysaccharide 05/03/2012   Pneumococcal Conjugate-13 12/17/2000   Pneumococcal-Unspecified 04/02/2000, 06/16/2000   Tdap 05/03/2012   Varicella 09/21/2000, 04/28/2008    Health Maintenance  Topic Date Due   HIV Screening  Never done   Hepatitis C Screening  Never done   INFLUENZA VACCINE  06/24/2022   PAP-Cervical Cytology Screening  06/26/2022 (Originally 09/12/2020)   PAP SMEAR-Modifier  06/26/2022 (Originally 09/12/2020)   TETANUS/TDAP  06/26/2022 (Originally 05/03/2022)   HPV VACCINES  Discontinued   COVID-19 Vaccine  Discontinued    Discussed health benefits of physical activity, and encouraged her to engage in regular exercise appropriate for her age and condition.  Problem List Items Addressed This Visit   None Visit Diagnoses     Annual physical exam    -  Primary   Cervical cancer screening       Need for Tdap vaccination          No follow-ups on file.     Joy Jessup, NP   

## 2022-04-22 ENCOUNTER — Encounter: Payer: Self-pay | Admitting: Podiatry

## 2022-04-22 ENCOUNTER — Ambulatory Visit (INDEPENDENT_AMBULATORY_CARE_PROVIDER_SITE_OTHER): Payer: Medicare Other | Admitting: Podiatry

## 2022-04-22 DIAGNOSIS — E1142 Type 2 diabetes mellitus with diabetic polyneuropathy: Secondary | ICD-10-CM | POA: Diagnosis not present

## 2022-04-22 DIAGNOSIS — L84 Corns and callosities: Secondary | ICD-10-CM | POA: Diagnosis not present

## 2022-04-22 DIAGNOSIS — M79674 Pain in right toe(s): Secondary | ICD-10-CM | POA: Diagnosis not present

## 2022-04-22 DIAGNOSIS — M79675 Pain in left toe(s): Secondary | ICD-10-CM

## 2022-04-22 DIAGNOSIS — B351 Tinea unguium: Secondary | ICD-10-CM | POA: Diagnosis not present

## 2022-04-26 NOTE — Progress Notes (Signed)
  Subjective:  Patient ID: Fernando Combs, male    DOB: Apr 20, 1946,  MRN: [id number]  SHARIQ PUIG presents to clinic today for at risk foot care with history of diabetic neuropathy and callus(es) b/l lower extremities and painful thick toenails that are difficult to trim. Painful toenails interfere with ambulation. Aggravating factors include wearing enclosed shoe gear. Pain is relieved with periodic professional debridement. Painful calluses are aggravated when weightbearing with and without shoegear. Pain is relieved with periodic professional debridement.  New problem(s): None.   PCP is Dorothyann Peng, NP , and last visit was Mar 25, 2022.  No Known Allergies  Review of Systems: Negative except as noted in the HPI.  Objective: No changes noted in today's physical examination. General: Patient is a pleasant 76 y.o. Caucasian male WD, WN in NAD. AAO x 3.   Neurovascular Examination: Capillary refill time to digits immediate b/l. Palpable pedal pulses b/l LE. No pain with calf compression b/l. Lower extremity skin temperature gradient within normal limits. No edema noted b/l LE. No cyanosis or clubbing noted b/l LE.  Protective sensation diminished with 10g monofilament b/l.  Dermatological:  Pedal skin thin, shiny and atrophic b/l LE. No open wounds b/l LE. No interdigital macerations noted b/l LE. Toenails 1-5 bilaterally elongated, discolored, dystrophic, thickened, and crumbly with subungual debris and tenderness to dorsal palpation. Hyperkeratotic lesion(s) submet head 5 b/l.  No erythema, no edema, no drainage, no fluctuance.  Musculoskeletal:  Muscle strength 5/5 to all lower extremity muscle groups bilaterally. Hammertoe deformity noted 2-5 b/l.     Latest Ref Rng & Units 01/07/2022   10:34 AM  Hemoglobin A1C  Hemoglobin-A1c 4.6 - 6.5 % 6.6     Assessment/Plan: 1. Pain due to onychomycosis of toenails of both feet   2. Callus   3. Diabetic peripheral neuropathy  associated with type 2 diabetes mellitus ([place])      -Examined patient. -Continue foot and shoe inspections daily. Monitor blood glucose per PCP/Endocrinologist's recommendations. -Patient to continue soft, supportive shoe gear daily. -Mycotic toenails 1-5 bilaterally were debrided in length and girth with sterile nail nippers and dremel without incident. -Callus(es) submet head 5 b/l pared utilizing sterile scalpel blade without complication or incident. Total number debrided =2. -[name] to call should there be question/concern in the interim.   Return in about 3 months (around 07/23/2022).  Marzetta Board, DPM

## 2022-05-12 ENCOUNTER — Other Ambulatory Visit: Payer: Self-pay | Admitting: Adult Health

## 2022-05-12 DIAGNOSIS — E118 Type 2 diabetes mellitus with unspecified complications: Secondary | ICD-10-CM

## 2022-05-23 ENCOUNTER — Other Ambulatory Visit: Payer: Self-pay | Admitting: Adult Health

## 2022-05-23 DIAGNOSIS — F4323 Adjustment disorder with mixed anxiety and depressed mood: Secondary | ICD-10-CM

## 2022-05-23 DIAGNOSIS — F5101 Primary insomnia: Secondary | ICD-10-CM

## 2022-05-23 DIAGNOSIS — F339 Major depressive disorder, recurrent, unspecified: Secondary | ICD-10-CM

## 2022-05-26 ENCOUNTER — Other Ambulatory Visit: Payer: Self-pay | Admitting: Adult Health

## 2022-06-18 DIAGNOSIS — N1832 Chronic kidney disease, stage 3b: Secondary | ICD-10-CM | POA: Diagnosis not present

## 2022-06-24 ENCOUNTER — Other Ambulatory Visit: Payer: Self-pay | Admitting: Adult Health

## 2022-06-24 ENCOUNTER — Encounter: Payer: Self-pay | Admitting: Podiatry

## 2022-06-24 DIAGNOSIS — Z760 Encounter for issue of repeat prescription: Secondary | ICD-10-CM

## 2022-06-24 DIAGNOSIS — I10 Essential (primary) hypertension: Secondary | ICD-10-CM

## 2022-06-25 DIAGNOSIS — R809 Proteinuria, unspecified: Secondary | ICD-10-CM | POA: Diagnosis not present

## 2022-06-25 DIAGNOSIS — N2581 Secondary hyperparathyroidism of renal origin: Secondary | ICD-10-CM | POA: Diagnosis not present

## 2022-06-25 DIAGNOSIS — D631 Anemia in chronic kidney disease: Secondary | ICD-10-CM | POA: Diagnosis not present

## 2022-06-25 DIAGNOSIS — R42 Dizziness and giddiness: Secondary | ICD-10-CM | POA: Diagnosis not present

## 2022-06-25 DIAGNOSIS — N281 Cyst of kidney, acquired: Secondary | ICD-10-CM | POA: Diagnosis not present

## 2022-06-25 DIAGNOSIS — N1832 Chronic kidney disease, stage 3b: Secondary | ICD-10-CM | POA: Diagnosis not present

## 2022-06-25 DIAGNOSIS — I129 Hypertensive chronic kidney disease with stage 1 through stage 4 chronic kidney disease, or unspecified chronic kidney disease: Secondary | ICD-10-CM | POA: Diagnosis not present

## 2022-07-09 ENCOUNTER — Other Ambulatory Visit: Payer: Self-pay | Admitting: *Deleted

## 2022-07-09 NOTE — Patient Outreach (Signed)
  Care Coordination   07/09/2022 Name: Fernando Combs MRN: [id number] DOB: Feb 04, 1946   Care Coordination Outreach Attempts:  An unsuccessful telephone outreach was attempted today to offer the patient information about available care coordination services as a benefit of their health plan.   Follow Up Plan:  Additional outreach attempts will be made to offer the patient care coordination information and services.   Encounter Outcome:  No Answer  Care Coordination Interventions Activated:  No   Care Coordination Interventions:  No, not indicated    Raina Mina, RN Care Management Coordinator [place] Office [phone]

## 2022-07-17 ENCOUNTER — Other Ambulatory Visit: Payer: Self-pay | Admitting: *Deleted

## 2022-07-17 NOTE — Patient Outreach (Signed)
  Care Coordination   07/17/2022 Name: Fernando Combs MRN: [id number] DOB: 09-08-1946   Care Coordination Outreach Attempts:  A second unsuccessful outreach was attempted today to offer the patient with information about available care coordination services as a benefit of their health plan.     Follow Up Plan:  Additional outreach attempts will be made to offer the patient care coordination information and services.   Encounter Outcome:  No Answer  Care Coordination Interventions Activated:  No   Care Coordination Interventions:  No, not indicated    Raina Mina, RN Care Management Coordinator [place] Office [phone]

## 2022-07-23 ENCOUNTER — Ambulatory Visit: Payer: Medicare Other | Admitting: Podiatry

## 2022-07-29 ENCOUNTER — Telehealth: Payer: Self-pay

## 2022-07-29 NOTE — Patient Outreach (Signed)
  Care Coordination   07/29/2022 Name: Fernando Combs MRN: [id number] DOB: [birth year]   Care Coordination Outreach Attempts:  A third unsuccessful outreach was attempted today to offer the patient with information about available care coordination services as a benefit of their health plan.   Follow Up Plan:  No further outreach attempts will be made at this time. We have been unable to contact the patient to offer or enroll patient in care coordination services  Encounter Outcome:  No Answer  Care Coordination Interventions Activated:  No   Care Coordination Interventions:  No, not indicated    Daneen Schick, BSW, CDP Social Worker, Certified Dementia Practitioner Care Coordination [phone]

## 2022-08-07 ENCOUNTER — Other Ambulatory Visit: Payer: Self-pay | Admitting: Adult Health

## 2022-08-07 DIAGNOSIS — E118 Type 2 diabetes mellitus with unspecified complications: Secondary | ICD-10-CM

## 2022-09-19 ENCOUNTER — Other Ambulatory Visit: Payer: Self-pay | Admitting: Adult Health

## 2022-09-19 DIAGNOSIS — Z760 Encounter for issue of repeat prescription: Secondary | ICD-10-CM

## 2022-10-24 ENCOUNTER — Other Ambulatory Visit: Payer: Self-pay | Admitting: Adult Health

## 2022-10-24 DIAGNOSIS — F5101 Primary insomnia: Secondary | ICD-10-CM

## 2022-10-24 DIAGNOSIS — F4323 Adjustment disorder with mixed anxiety and depressed mood: Secondary | ICD-10-CM

## 2022-10-24 DIAGNOSIS — F339 Major depressive disorder, recurrent, unspecified: Secondary | ICD-10-CM

## 2022-11-10 ENCOUNTER — Other Ambulatory Visit: Payer: Self-pay | Admitting: Adult Health

## 2022-11-10 DIAGNOSIS — E039 Hypothyroidism, unspecified: Secondary | ICD-10-CM

## 2022-11-22 ENCOUNTER — Other Ambulatory Visit: Payer: Self-pay | Admitting: Adult Health

## 2022-11-22 DIAGNOSIS — F339 Major depressive disorder, recurrent, unspecified: Secondary | ICD-10-CM

## 2022-12-02 ENCOUNTER — Other Ambulatory Visit: Payer: Self-pay | Admitting: Adult Health

## 2022-12-26 DIAGNOSIS — N1832 Chronic kidney disease, stage 3b: Secondary | ICD-10-CM | POA: Diagnosis not present

## 2022-12-30 DIAGNOSIS — N1832 Chronic kidney disease, stage 3b: Secondary | ICD-10-CM | POA: Diagnosis not present

## 2022-12-30 DIAGNOSIS — I129 Hypertensive chronic kidney disease with stage 1 through stage 4 chronic kidney disease, or unspecified chronic kidney disease: Secondary | ICD-10-CM | POA: Diagnosis not present

## 2022-12-30 DIAGNOSIS — R809 Proteinuria, unspecified: Secondary | ICD-10-CM | POA: Diagnosis not present

## 2022-12-30 DIAGNOSIS — N2581 Secondary hyperparathyroidism of renal origin: Secondary | ICD-10-CM | POA: Diagnosis not present

## 2022-12-30 DIAGNOSIS — D631 Anemia in chronic kidney disease: Secondary | ICD-10-CM | POA: Diagnosis not present

## 2022-12-30 DIAGNOSIS — N281 Cyst of kidney, acquired: Secondary | ICD-10-CM | POA: Diagnosis not present

## 2023-03-02 ENCOUNTER — Telehealth: Payer: Self-pay | Admitting: Adult Health

## 2023-03-02 NOTE — Telephone Encounter (Signed)
Called patient to schedule Medicare Annual Wellness Visit (AWV). No voicemail available to leave a message.  Last date of AWV: 02/25/22  Please schedule an appointment at any time with [facility], [place] or [facility].  If any questions, please contact me at [phone].  Thank you ,  Rudell Cobb AWV direct phone # [phone]

## 2023-03-07 ENCOUNTER — Other Ambulatory Visit: Payer: Self-pay | Admitting: Adult Health

## 2023-03-07 DIAGNOSIS — Z760 Encounter for issue of repeat prescription: Secondary | ICD-10-CM

## 2023-03-07 DIAGNOSIS — I10 Essential (primary) hypertension: Secondary | ICD-10-CM

## 2023-03-19 ENCOUNTER — Other Ambulatory Visit: Payer: Self-pay | Admitting: Adult Health

## 2023-03-19 DIAGNOSIS — F4323 Adjustment disorder with mixed anxiety and depressed mood: Secondary | ICD-10-CM

## 2023-03-19 DIAGNOSIS — F5101 Primary insomnia: Secondary | ICD-10-CM

## 2023-03-19 DIAGNOSIS — F339 Major depressive disorder, recurrent, unspecified: Secondary | ICD-10-CM

## 2023-05-07 DIAGNOSIS — M19011 Primary osteoarthritis, right shoulder: Secondary | ICD-10-CM | POA: Diagnosis not present

## 2023-05-07 DIAGNOSIS — M47812 Spondylosis without myelopathy or radiculopathy, cervical region: Secondary | ICD-10-CM | POA: Diagnosis not present

## 2023-05-07 DIAGNOSIS — S4291XA Fracture of right shoulder girdle, part unspecified, initial encounter for closed fracture: Secondary | ICD-10-CM | POA: Diagnosis not present

## 2023-05-07 DIAGNOSIS — R531 Weakness: Secondary | ICD-10-CM | POA: Diagnosis not present

## 2023-05-07 DIAGNOSIS — S0990XA Unspecified injury of head, initial encounter: Secondary | ICD-10-CM | POA: Diagnosis not present

## 2023-05-07 DIAGNOSIS — R9431 Abnormal electrocardiogram [ECG] [EKG]: Secondary | ICD-10-CM | POA: Diagnosis not present

## 2023-05-07 DIAGNOSIS — G9389 Other specified disorders of brain: Secondary | ICD-10-CM | POA: Diagnosis not present

## 2023-05-07 DIAGNOSIS — N189 Chronic kidney disease, unspecified: Secondary | ICD-10-CM | POA: Diagnosis not present

## 2023-05-07 DIAGNOSIS — M542 Cervicalgia: Secondary | ICD-10-CM | POA: Diagnosis not present

## 2023-05-07 DIAGNOSIS — R9089 Other abnormal findings on diagnostic imaging of central nervous system: Secondary | ICD-10-CM | POA: Diagnosis not present

## 2023-05-07 DIAGNOSIS — S0083XA Contusion of other part of head, initial encounter: Secondary | ICD-10-CM | POA: Diagnosis not present

## 2023-05-07 DIAGNOSIS — M064 Inflammatory polyarthropathy: Secondary | ICD-10-CM | POA: Diagnosis not present

## 2023-05-12 ENCOUNTER — Telehealth: Payer: Self-pay | Admitting: Adult Health

## 2023-05-12 NOTE — Telephone Encounter (Signed)
Pt needs ER f/u//tes

## 2023-05-13 ENCOUNTER — Other Ambulatory Visit: Payer: Self-pay | Admitting: Adult Health

## 2023-05-13 DIAGNOSIS — E118 Type 2 diabetes mellitus with unspecified complications: Secondary | ICD-10-CM

## 2023-05-14 NOTE — Telephone Encounter (Signed)
Pt has not been seen in over a year. Need to follow up with fasting CPE.

## 2023-05-30 ENCOUNTER — Other Ambulatory Visit: Payer: Self-pay | Admitting: Adult Health

## 2023-05-30 DIAGNOSIS — E118 Type 2 diabetes mellitus with unspecified complications: Secondary | ICD-10-CM

## 2023-05-31 ENCOUNTER — Other Ambulatory Visit: Payer: Self-pay | Admitting: Adult Health

## 2023-05-31 DIAGNOSIS — Z760 Encounter for issue of repeat prescription: Secondary | ICD-10-CM

## 2023-06-02 NOTE — Telephone Encounter (Signed)
Patient need to schedule an ov for more refills. 

## 2023-07-31 ENCOUNTER — Other Ambulatory Visit: Payer: Self-pay | Admitting: Adult Health

## 2023-07-31 DIAGNOSIS — E039 Hypothyroidism, unspecified: Secondary | ICD-10-CM

## 2023-08-04 NOTE — Telephone Encounter (Signed)
Ok for refill. Not sure if pt suppose to be on this dose. Please advise

## 2023-08-06 ENCOUNTER — Telehealth: Payer: Self-pay | Admitting: Adult Health

## 2023-08-06 NOTE — Telephone Encounter (Signed)
[employer] tech with [facility] is calling and would like ok to switch manufacturer  from amneal to lupin for levothyroxine (SYNTHROID) 125 MCG tablet

## 2023-08-06 NOTE — Telephone Encounter (Signed)
OK to switch 

## 2023-08-07 NOTE — Telephone Encounter (Signed)
Kipp Brood from pharmacy given ok orders.

## 2023-08-17 ENCOUNTER — Other Ambulatory Visit: Payer: Self-pay | Admitting: Adult Health

## 2023-08-18 NOTE — Telephone Encounter (Signed)
Patient need to schedule a CPE for more refills.

## 2023-08-31 ENCOUNTER — Other Ambulatory Visit: Payer: Self-pay | Admitting: Adult Health

## 2023-09-01 NOTE — Telephone Encounter (Signed)
Patient need to schedule CPE for more refills.

## 2023-09-08 ENCOUNTER — Other Ambulatory Visit: Payer: Self-pay | Admitting: Adult Health

## 2023-09-08 DIAGNOSIS — E118 Type 2 diabetes mellitus with unspecified complications: Secondary | ICD-10-CM

## 2023-09-09 NOTE — Telephone Encounter (Signed)
Patient need to schedule cpe  for more refills.

## 2023-10-04 ENCOUNTER — Other Ambulatory Visit: Payer: Self-pay | Admitting: Adult Health

## 2023-10-04 DIAGNOSIS — F339 Major depressive disorder, recurrent, unspecified: Secondary | ICD-10-CM

## 2023-10-28 ENCOUNTER — Other Ambulatory Visit: Payer: Self-pay | Admitting: Adult Health

## 2023-10-28 DIAGNOSIS — Z760 Encounter for issue of repeat prescription: Secondary | ICD-10-CM

## 2023-10-28 DIAGNOSIS — I10 Essential (primary) hypertension: Secondary | ICD-10-CM

## 2023-11-11 ENCOUNTER — Other Ambulatory Visit: Payer: Self-pay | Admitting: Adult Health

## 2023-11-11 DIAGNOSIS — E118 Type 2 diabetes mellitus with unspecified complications: Secondary | ICD-10-CM

## 2023-11-11 NOTE — Telephone Encounter (Signed)
 Patient need to schedule CPE for more refills.

## 2023-11-11 NOTE — Telephone Encounter (Signed)
Copied from CRM [id number]. Topic: Clinical - Medication Refill >> Nov 11, 2023  2:37 PM Elizebeth Brooking wrote: Most Recent Primary Care Visit:  Provider: Shirline Frees  Department: [facility]  Visit Type: OFFICE VISIT  Date: 03/25/2022  Medication: metFORMIN (GLUCOPHAGE)   Has the patient contacted their pharmacy? Yes (Agent: If no, request that the patient contact the pharmacy for the refill. If patient does not wish to contact the pharmacy document the reason why and proceed with request.) (Agent: If yes, when and what did the pharmacy advise?)  Is this the correct pharmacy for this prescription? Yes If no, delete pharmacy and type the correct one.  This is the patient's preferred pharmacy:  [facility] - [place], [place] - [address] [facility] [address] [place] [address] [place] [address] Phone: [phone] Fax: [phone]  [id number] [place], [place] - [address] [facility] [address] [facility] [address] [place] [address] Phone: [phone] Fax: [phone]  Dr Erskine Emery [facility] [place], [place] - [address] [place] [address] [place] [address] [place] [address] Phone: [phone] Fax: [phone]  [facility] - [place], [place] - [address] [place] [address] Phone: [phone] Fax: [phone]   Has the prescription been filled recently? No  Is the patient out of the medication? Yes  Has the patient been seen for an appointment in the last year OR does the patient have an upcoming appointment?   Can we respond through MyChart?   Agent: Please be advised that Rx refills may take up to 3 business days. We ask that you follow-up with your pharmacy.

## 2023-11-14 ENCOUNTER — Other Ambulatory Visit: Payer: Self-pay | Admitting: Adult Health

## 2023-11-14 DIAGNOSIS — I10 Essential (primary) hypertension: Secondary | ICD-10-CM

## 2023-11-14 DIAGNOSIS — Z760 Encounter for issue of repeat prescription: Secondary | ICD-10-CM

## 2023-11-18 ENCOUNTER — Other Ambulatory Visit: Payer: Self-pay | Admitting: Adult Health

## 2023-11-18 DIAGNOSIS — I10 Essential (primary) hypertension: Secondary | ICD-10-CM

## 2023-11-18 DIAGNOSIS — Z760 Encounter for issue of repeat prescription: Secondary | ICD-10-CM

## 2023-11-19 NOTE — Telephone Encounter (Signed)
 Patient need to schedule for more refills.

## 2023-11-20 ENCOUNTER — Encounter: Payer: Self-pay | Admitting: Adult Health

## 2023-11-20 ENCOUNTER — Ambulatory Visit: Payer: Medicare Other | Admitting: Adult Health

## 2023-11-20 NOTE — Progress Notes (Deleted)
Subjective:    Patient ID: Fernando Combs, male    DOB: Jul 18, 1946, 77 y.o.   MRN: [id number]  HPI Patient presents for yearly preventative medicine examination. He is a pleasant 77 year old male who  has a past medical history of Benign prostatic hypertrophy, CAD (coronary artery disease), Carotid artery occlusion, Colitis, ischemic (HCC) (06/2005), Diabetes mellitus type II, Diabetes, polyneuropathy (HCC), Diabetic peripheral neuropathy (HCC), DVT (deep venous thrombosis) (HCC), Gout, History of myocardial infarction, History of syncope, History of tobacco abuse, Hyperlipidemia, Hypertension, Hypothyroidism, Myocardial infarction [facility]), Peripheral vascular disease (HCC), Shingles, and Thrombus (04/2008).  DM Type 2 -currently maintained on metformin 500 mg twice daily.  He does not monitor  his blood sugars. Denies episodes of hypoglycemia.  He does report that his nephrologist tried placing him on Jardiance and Farxiga but both were too expensive for him. Lab Results  Component Value Date   HGBA1C 6.6 (H) 01/07/2022   HGBA1C 6.7 (H) 01/01/2021   HGBA1C 6.6 (H) 10/12/2020    Diabetic neuropathy-takes gabapentin 100 mg 3 times daily.  Feels as though this helps with the discomfort in his lower extremities but does not completely resolve it.  BPH-currently prescribed Flomax 0.8 mg daily  Hypertension-prescribed Norvasc 5 mg daily and Coreg 25 mg BID, and losartan 25 mg daily.  He does report episodes of lightheadedness in the late morning after taking his blood pressure medications.  He has had falls over the year and the lightheadedness.  Denies loss of consciousness or injury during these falls. BP Readings from Last 3 Encounters:  03/25/22 110/70  02/25/22 108/60  01/07/22 130/70   CAD -managed by cardiology and vascular surgery.  Has a history of multiple stents.  Last cardiac cath was in 2016.  Currently prescribed aspirin 81 mg, Lipitor 80 mg carvedilol 25 mg and Imdur 60 mg.  He  denies chest pain, significant shortness of breath, fatigue, or lower extremity claudication symptoms Lab Results  Component Value Date   CHOL 103 01/07/2022   HDL 47.30 01/07/2022   LDLCALC 31 01/07/2022   LDLDIRECT 37.0 10/13/2019   TRIG 128.0 01/07/2022   CHOLHDL 2 01/07/2022    Hypothyroidism-well controlled with Synthroid daily Lab Results  Component Value Date   TSH 4.38 02/14/2022   Anxiety/ Depression -takes Zoloft 50 mg daily.  As of lately his depression has been worse.  Does not enjoy things that he once did and is having trouble sleeping.  He often wakes up in the middle the night unable to go back to sleep.  He has tried over-the-counter melatonin without improvement in his symptoms.  CKD Stage 3- is being seen by Nephrology on a routine basis    All immunizations and health maintenance protocols were reviewed with the patient and needed orders were placed.  Appropriate screening laboratory values were ordered for the patient including screening of hyperlipidemia, renal function and hepatic function. If indicated by BPH, a PSA was ordered.  Medication reconciliation,  past medical history, social history, problem list and allergies were reviewed in detail with the patient  Goals were established with regard to weight loss, exercise, and  diet in compliance with medications   Review of Systems  Constitutional: Negative.   HENT: Negative.    Eyes: Negative.   Respiratory: Negative.    Cardiovascular: Negative.   Gastrointestinal: Negative.   Endocrine: Negative.   Genitourinary: Negative.   Musculoskeletal: Negative.   Skin: Negative.   Allergic/Immunologic: Negative.   Neurological: Negative.  Hematological: Negative.   Psychiatric/Behavioral: Negative.    All other systems reviewed and are negative.  Past Medical History:  Diagnosis Date   Benign prostatic hypertrophy    CAD (coronary artery disease)    cath 05/16/2015 patent LIMA to LAD, SVG to  OM, SVG to RPDA, SVG to diagonal was occluded. PTCA 95% prox LAD reduce down to 60%. Medical management   Carotid artery occlusion    Colitis, ischemic (HCC) 06/2005    history of   Diabetes mellitus type II    Diabetes, polyneuropathy (HCC)    Diabetic peripheral neuropathy (HCC)    DVT (deep venous thrombosis) (HCC)    Gout    History of myocardial infarction    History of syncope    most likely vasovagal   History of tobacco abuse    Hyperlipidemia    Hypertension    Hypothyroidism    Myocardial infarction [facility])    Peripheral vascular disease (HCC)    Shingles    Thrombus 04/2008   acute thrombus of his right superficial artery with claudication    Social History   Socioeconomic History   Marital status: Married    Spouse name: Not on file   Number of children: Not on file   Years of education: Not on file   Highest education level: Not on file  Occupational History   Not on file  Tobacco Use   Smoking status: Former    Current packs/day: 0.00    Average packs/day: 1 pack/day for 50.0 years (50.0 ttl pk-yrs)    Types: Cigarettes    Start date: 04/13/1961    Quit date: 04/14/2011    Years since quitting: 12.6   Smokeless tobacco: Never  Vaping Use   Vaping status: Never Used  Substance and Sexual Activity   Alcohol use: No   Drug use: No   Sexual activity: Not on file  Other Topics Concern   Not on file  Social History Narrative   Occupation:  Retired from [employer]    Married - lives with wife (2nd marriage)   3 children     Alcohol use-no            Social Drivers of Health   Financial Resource Strain: Low Risk  (02/25/2022)   Overall Financial Resource Strain (CARDIA)    Difficulty of Paying Living Expenses: Not hard at all  Food Insecurity: Food Insecurity Present (02/25/2022)   Hunger Vital Sign    Worried About Running Out of Food in the Last Year: Sometimes true    Ran Out of Food in the Last Year: Sometimes true  Transportation Needs:  No Transportation Needs (02/25/2022)   PRAPARE - Administrator, Civil Service (Medical): No    Lack of Transportation (Non-Medical): No  Physical Activity: Inactive (02/25/2022)   Exercise Vital Sign    Days of Exercise per Week: 0 days    Minutes of Exercise per Session: 0 min  Stress: No Stress Concern Present (02/25/2022)   [employer] of Occupational Health - Occupational Stress Questionnaire    Feeling of Stress : Not at all  Social Connections: Not on file  Intimate Partner Violence: Not on file    Past Surgical History:  Procedure Laterality Date   CARDIAC CATHETERIZATION N/A 05/15/2015   Procedure: Left Heart Cath and Cors/Grafts Angiography;  Surgeon: Lennette Bihari, MD;  Location: [facility] INVASIVE CV LAB;  Service: Cardiovascular;  Laterality: N/A;   CATARACT EXTRACTION Bilateral  CORONARY ARTERY BYPASS GRAFT  08/04/00   times 4 (left interanl mammary artery to the left anterior descending, saphenous vein,graft to diagonal, saphenous vein graft to obtuse marginal, spahenous vein graft to posterior descending-Surgeon  Kathlee Nations Tright III   LOWER EXTREMITY ANGIOGRAM  March 09, 2012   LOWER EXTREMITY ANGIOGRAM Right 03/09/2012   Procedure: LOWER EXTREMITY ANGIOGRAM;  Surgeon: Nada Libman, MD;  Location: [facility] CATH LAB;  Service: Cardiovascular;  Laterality: Right;   OTHER SURGICAL HISTORY  11/2010   angiogram, right leg stent placement-  -Dr Hart [place]   peripheral vascular surgery     PR VEIN BYPASS GRAFT,AORTO-FEM-POP  08/04/00    Family History  Problem Relation Age of Onset   Deep vein thrombosis Mother        Varicose Veins   Heart disease Mother    Hyperlipidemia Mother    Hypertension Mother    Peripheral vascular disease Mother    Lung cancer Mother    Stroke Father    Deep vein thrombosis Father    Diabetes Father    Heart disease Father        Heart Disease  before age 63   Hyperlipidemia Father    Hypertension Father    Heart attack Father     Peripheral vascular disease Father    Alcohol abuse Unknown    Stroke Unknown    Coronary artery disease Unknown    Hyperlipidemia Unknown    Deep vein thrombosis Brother    Diabetes Brother    Heart disease Brother        Heart Disease before age 75   Hyperlipidemia Brother    Hypertension Brother    Heart attack Brother    Peripheral vascular disease Brother    Lung cancer Sister    Kidney cancer Brother        on hospice    Other Neg Hx        denies family history of blood clots or blood disorder    No Known Allergies  Current Outpatient Medications on File Prior to Visit  Medication Sig Dispense Refill   allopurinol (ZYLOPRIM) 100 MG tablet TAKE 2 TABLETS BY MOUTH DAILY 200 tablet 2   amLODipine (NORVASC) 5 MG tablet TAKE 1 TABLET BY MOUTH DAILY 30 tablet 0   aspirin 81 MG chewable tablet Chew 1 tablet (81 mg total) by mouth daily.     atorvastatin (LIPITOR) 80 MG tablet TAKE 1 TABLET BY MOUTH DAILY AT  6 P.M 100 tablet 2   carvedilol (COREG) 25 MG tablet TAKE 1 TABLET BY MOUTH  TWICE DAILY WITH MEALS 60 tablet 11   Cholecalciferol (VITAMIN D-3) 125 MCG (5000 UT) TABS Take 1 tablet by mouth daily. 90 tablet 3   gabapentin (NEURONTIN) 100 MG capsule Take 1 capsule (100 mg total) by mouth 3 (three) times daily. 270 capsule 1   gabapentin (NEURONTIN) 300 MG capsule TAKE 1 CAPSULE BY MOUTH 3 TIMES  DAILY 90 capsule 0   isosorbide mononitrate (IMDUR) 60 MG 24 hr tablet Take 1 tablet (60 mg total) by mouth daily. Need physical exam for more refills 30 tablet 0   levothyroxine (SYNTHROID) 125 MCG tablet TAKE 1 TABLET BY MOUTH DAILY 100 tablet 1   losartan (COZAAR) 25 MG tablet Take 25 mg by mouth daily.     metFORMIN (GLUCOPHAGE) 500 MG tablet TAKE 1 TABLET BY MOUTH TWICE  DAILY WITH A MEAL 200 tablet 2   nitroGLYCERIN (NITROSTAT) 0.4 MG SL tablet  DISSOLVE 1 TABLET UNDER THE TONGUE EVERY 5 MINUTES AS  NEEDED FOR CHEST PAIN FOR  UP TO 3 DOSES. SEEK MEDICAL ATTENTION IF NO RELIEF. 90  tablet 3   Omega-3 Fatty Acids (FISH OIL) 1000 MG CPDR Take by mouth.     sertraline (ZOLOFT) 50 MG tablet TAKE 1 TABLET BY MOUTH DAILY 90 tablet 3   tadalafil (CIALIS) 20 MG tablet TAKE ONE TABLET BY MOUTH 30 MINUTES PRIOR TO INTERCOURSE. 30 tablet 5   tamsulosin (FLOMAX) 0.4 MG CAPS capsule TAKE 2 CAPSULES BY MOUTH DAILY 200 capsule 2   traZODone (DESYREL) 50 MG tablet TAKE 1/2 TO 1 TABLET BY MOUTH AT BEDTIME AS NEEDED FOR SLEEP 90 tablet 3   No current facility-administered medications on file prior to visit.    There were no vitals taken for this visit.      Objective:   Physical Exam Vitals and nursing note reviewed.  Constitutional:      General: He is not in acute distress.    Appearance: Normal appearance. He is not ill-appearing.  HENT:     Head: Normocephalic and atraumatic.     Right Ear: Tympanic membrane, ear canal and external ear normal. There is no impacted cerumen.     Left Ear: Tympanic membrane, ear canal and external ear normal. There is no impacted cerumen.     Nose: Nose normal. No congestion or rhinorrhea.     Mouth/Throat:     Mouth: Mucous membranes are moist.     Pharynx: Oropharynx is clear.  Eyes:     Extraocular Movements: Extraocular movements intact.     Conjunctiva/sclera: Conjunctivae normal.     Pupils: Pupils are equal, round, and reactive to light.  Neck:     Vascular: No carotid bruit.  Cardiovascular:     Rate and Rhythm: Normal rate and regular rhythm.     Pulses: Normal pulses.     Heart sounds: No murmur heard.    No friction rub. No gallop.  Pulmonary:     Effort: Pulmonary effort is normal.     Breath sounds: Normal breath sounds.  Abdominal:     General: Abdomen is flat. Bowel sounds are normal. There is no distension.     Palpations: Abdomen is soft. There is no mass.     Tenderness: There is no abdominal tenderness. There is no guarding or rebound.     Hernia: No hernia is present.  Musculoskeletal:        General: Normal  range of motion.     Cervical back: Normal range of motion and neck supple.  Lymphadenopathy:     Cervical: No cervical adenopathy.  Skin:    General: Skin is warm and dry.     Capillary Refill: Capillary refill takes less than 2 seconds.  Neurological:     General: No focal deficit present.     Mental Status: He is alert and oriented to person, place, and time.  Psychiatric:        Mood and Affect: Mood normal.        Behavior: Behavior normal.        Thought Content: Thought content normal.        Judgment: Judgment normal.           Assessment & Plan:

## 2023-12-10 ENCOUNTER — Other Ambulatory Visit: Payer: Self-pay | Admitting: Adult Health

## 2023-12-10 DIAGNOSIS — Z760 Encounter for issue of repeat prescription: Secondary | ICD-10-CM

## 2023-12-10 DIAGNOSIS — I10 Essential (primary) hypertension: Secondary | ICD-10-CM

## 2023-12-11 ENCOUNTER — Other Ambulatory Visit: Payer: Self-pay | Admitting: Adult Health

## 2023-12-11 DIAGNOSIS — I10 Essential (primary) hypertension: Secondary | ICD-10-CM

## 2023-12-11 DIAGNOSIS — Z760 Encounter for issue of repeat prescription: Secondary | ICD-10-CM

## 2023-12-11 NOTE — Telephone Encounter (Signed)
 Patient need to schedule for more refills.

## 2023-12-17 ENCOUNTER — Other Ambulatory Visit: Payer: Self-pay | Admitting: Adult Health

## 2023-12-17 ENCOUNTER — Telehealth: Payer: Self-pay | Admitting: *Deleted

## 2023-12-17 ENCOUNTER — Telehealth: Payer: Self-pay | Admitting: Adult Health

## 2023-12-17 DIAGNOSIS — F4323 Adjustment disorder with mixed anxiety and depressed mood: Secondary | ICD-10-CM

## 2023-12-17 DIAGNOSIS — I10 Essential (primary) hypertension: Secondary | ICD-10-CM

## 2023-12-17 DIAGNOSIS — E039 Hypothyroidism, unspecified: Secondary | ICD-10-CM

## 2023-12-17 DIAGNOSIS — Z760 Encounter for issue of repeat prescription: Secondary | ICD-10-CM

## 2023-12-17 DIAGNOSIS — F339 Major depressive disorder, recurrent, unspecified: Secondary | ICD-10-CM

## 2023-12-17 DIAGNOSIS — F5101 Primary insomnia: Secondary | ICD-10-CM

## 2023-12-17 DIAGNOSIS — E118 Type 2 diabetes mellitus with unspecified complications: Secondary | ICD-10-CM

## 2023-12-17 NOTE — Telephone Encounter (Signed)
Pt has not been seen in almost 2 years. Tried to call pt but no answer. Will try again later.

## 2023-12-17 NOTE — Telephone Encounter (Signed)
Please advise 

## 2023-12-17 NOTE — Telephone Encounter (Signed)
Copied from CRM [id number]. Topic: Clinical - Prescription Issue >> Dec 17, 2023  2:13 PM Adaysia C wrote: Reason for CRM: Patients pharmacy([facility]) has been attempting to contact the provider about refilling the patients prescriptions but have not received a response back. I informed the patient that if the medication is expired he will have to come into the office for a medication renewal. Which the patient planned to do but now he has to move to [facility] and he will not be able to come into the office for a visit before he leaves. Patient would like to know if the provider can refill the needed medications one more time until he can find a new provider in [place]. Please follow up with patient [phone]

## 2023-12-17 NOTE — Telephone Encounter (Signed)
Copied from CRM [id number]. Topic: Referral - Question >> Dec 17, 2023  2:20 PM Adaysia C wrote: Reason for CRM: Patient called to inform the provider he is moving to [facility] and he would to know if [facility](NP) can recommend or refer him to any PCP's in that area. Please follow up with patient [phone]

## 2023-12-18 NOTE — Telephone Encounter (Signed)
Ok to refill pt medications?

## 2023-12-18 NOTE — Telephone Encounter (Signed)
Called pt 3x no answer. Rx was refilled. If pt returns call please advise of Cory's message below.

## 2023-12-18 NOTE — Telephone Encounter (Signed)
Noted

## 2024-01-27 ENCOUNTER — Other Ambulatory Visit: Payer: Self-pay | Admitting: Adult Health

## 2024-01-27 DIAGNOSIS — F4323 Adjustment disorder with mixed anxiety and depressed mood: Secondary | ICD-10-CM

## 2024-01-27 DIAGNOSIS — F339 Major depressive disorder, recurrent, unspecified: Secondary | ICD-10-CM

## 2024-01-27 DIAGNOSIS — F5101 Primary insomnia: Secondary | ICD-10-CM

## 2024-01-28 NOTE — Telephone Encounter (Signed)
 Patient need to schedule for more refills.

## 2024-02-01 ENCOUNTER — Other Ambulatory Visit: Payer: Self-pay | Admitting: Adult Health

## 2024-02-01 DIAGNOSIS — Z760 Encounter for issue of repeat prescription: Secondary | ICD-10-CM

## 2024-02-15 ENCOUNTER — Other Ambulatory Visit: Payer: Self-pay | Admitting: Adult Health

## 2024-02-15 DIAGNOSIS — E039 Hypothyroidism, unspecified: Secondary | ICD-10-CM

## 2024-02-16 NOTE — Telephone Encounter (Signed)
 Patient need to schedule CPE for more refills.

## 2024-02-28 ENCOUNTER — Other Ambulatory Visit: Payer: Self-pay | Admitting: Adult Health

## 2024-02-28 DIAGNOSIS — E039 Hypothyroidism, unspecified: Secondary | ICD-10-CM

## 2024-03-01 NOTE — Telephone Encounter (Signed)
 Patient need to schedule for more refills.

## 2024-03-16 ENCOUNTER — Other Ambulatory Visit: Payer: Self-pay | Admitting: Adult Health

## 2024-03-16 DIAGNOSIS — Z760 Encounter for issue of repeat prescription: Secondary | ICD-10-CM

## 2024-03-17 NOTE — Telephone Encounter (Signed)
 Patient need to schedule CPE for more refills.

## 2024-03-18 NOTE — Telephone Encounter (Signed)
 Called pt to schedule appt. No answer.

## 2024-04-13 ENCOUNTER — Other Ambulatory Visit: Payer: Self-pay | Admitting: Adult Health

## 2024-04-13 DIAGNOSIS — Z760 Encounter for issue of repeat prescription: Secondary | ICD-10-CM

## 2024-04-14 NOTE — Telephone Encounter (Signed)
 Patient need to schedule for more refills.
